# Patient Record
Sex: Female | Born: 1965 | Race: Black or African American | Hispanic: No | Smoking: Never smoker
Health system: Southern US, Community
[De-identification: ages and names within clinical notes are randomized; demographics above are authoritative.]

## PROBLEM LIST (undated history)

## (undated) DIAGNOSIS — G4730 Sleep apnea, unspecified: Secondary | ICD-10-CM

## (undated) DIAGNOSIS — Z8679 Personal history of other diseases of the circulatory system: Secondary | ICD-10-CM

## (undated) DIAGNOSIS — R002 Palpitations: Secondary | ICD-10-CM

## (undated) DIAGNOSIS — R3915 Urgency of urination: Secondary | ICD-10-CM

## (undated) DIAGNOSIS — Z973 Presence of spectacles and contact lenses: Secondary | ICD-10-CM

## (undated) DIAGNOSIS — R112 Nausea with vomiting, unspecified: Secondary | ICD-10-CM

## (undated) DIAGNOSIS — B009 Herpesviral infection, unspecified: Secondary | ICD-10-CM

## (undated) DIAGNOSIS — M8580 Other specified disorders of bone density and structure, unspecified site: Secondary | ICD-10-CM

## (undated) DIAGNOSIS — M419 Scoliosis, unspecified: Secondary | ICD-10-CM

## (undated) DIAGNOSIS — E7800 Pure hypercholesterolemia, unspecified: Secondary | ICD-10-CM

## (undated) DIAGNOSIS — D219 Benign neoplasm of connective and other soft tissue, unspecified: Secondary | ICD-10-CM

## (undated) DIAGNOSIS — I10 Essential (primary) hypertension: Secondary | ICD-10-CM

## (undated) DIAGNOSIS — C801 Malignant (primary) neoplasm, unspecified: Secondary | ICD-10-CM

## (undated) DIAGNOSIS — L709 Acne, unspecified: Secondary | ICD-10-CM

## (undated) DIAGNOSIS — T4145XA Adverse effect of unspecified anesthetic, initial encounter: Secondary | ICD-10-CM

## (undated) DIAGNOSIS — Z9889 Other specified postprocedural states: Secondary | ICD-10-CM

## (undated) DIAGNOSIS — A630 Anogenital (venereal) warts: Secondary | ICD-10-CM

## (undated) DIAGNOSIS — T8859XA Other complications of anesthesia, initial encounter: Secondary | ICD-10-CM

## (undated) DIAGNOSIS — D0510 Intraductal carcinoma in situ of unspecified breast: Secondary | ICD-10-CM

## (undated) DIAGNOSIS — N83202 Unspecified ovarian cyst, left side: Secondary | ICD-10-CM

## (undated) DIAGNOSIS — Z923 Personal history of irradiation: Secondary | ICD-10-CM

## (undated) HISTORY — DX: Personal history of irradiation: Z92.3

## (undated) HISTORY — DX: Malignant (primary) neoplasm, unspecified: C80.1

## (undated) HISTORY — DX: Scoliosis, unspecified: M41.9

## (undated) HISTORY — DX: Acne, unspecified: L70.9

## (undated) HISTORY — DX: Essential (primary) hypertension: I10

## (undated) HISTORY — DX: Pure hypercholesterolemia, unspecified: E78.00

## (undated) HISTORY — DX: Other specified disorders of bone density and structure, unspecified site: M85.80

## (undated) HISTORY — PX: WISDOM TOOTH EXTRACTION: SHX21

## (undated) HISTORY — DX: Anogenital (venereal) warts: A63.0

## (undated) HISTORY — DX: Intraductal carcinoma in situ of unspecified breast: D05.10

## (undated) HISTORY — DX: Sleep apnea, unspecified: G47.30

## (undated) HISTORY — DX: Herpesviral infection, unspecified: B00.9

---

## 1992-04-12 HISTORY — PX: LASER ABLATION OF THE CERVIX: SHX1949

## 1997-09-14 ENCOUNTER — Inpatient Hospital Stay (HOSPITAL_COMMUNITY): Admission: AD | Admit: 1997-09-14 | Discharge: 1997-09-14 | Payer: Self-pay | Admitting: Obstetrics and Gynecology

## 1997-11-06 ENCOUNTER — Other Ambulatory Visit: Admission: RE | Admit: 1997-11-06 | Discharge: 1997-11-06 | Payer: Self-pay | Admitting: Obstetrics and Gynecology

## 1997-11-11 ENCOUNTER — Other Ambulatory Visit: Admission: RE | Admit: 1997-11-11 | Discharge: 1997-11-11 | Payer: Self-pay | Admitting: Obstetrics and Gynecology

## 1998-01-15 ENCOUNTER — Encounter: Admission: RE | Admit: 1998-01-15 | Discharge: 1998-04-15 | Payer: Self-pay | Admitting: Gynecology

## 1998-07-10 ENCOUNTER — Encounter: Payer: Self-pay | Admitting: Gynecology

## 1998-07-10 ENCOUNTER — Inpatient Hospital Stay (HOSPITAL_COMMUNITY): Admission: AD | Admit: 1998-07-10 | Discharge: 1998-07-14 | Payer: Self-pay | Admitting: Obstetrics and Gynecology

## 1998-08-21 ENCOUNTER — Other Ambulatory Visit: Admission: RE | Admit: 1998-08-21 | Discharge: 1998-08-21 | Payer: Self-pay | Admitting: Obstetrics and Gynecology

## 1998-08-23 ENCOUNTER — Emergency Department (HOSPITAL_COMMUNITY): Admission: EM | Admit: 1998-08-23 | Discharge: 1998-08-23 | Payer: Self-pay | Admitting: Emergency Medicine

## 1999-01-02 ENCOUNTER — Emergency Department (HOSPITAL_COMMUNITY): Admission: EM | Admit: 1999-01-02 | Discharge: 1999-01-03 | Payer: Self-pay | Admitting: Emergency Medicine

## 1999-01-02 ENCOUNTER — Emergency Department (HOSPITAL_COMMUNITY): Admission: EM | Admit: 1999-01-02 | Discharge: 1999-01-02 | Payer: Self-pay | Admitting: Emergency Medicine

## 1999-01-03 ENCOUNTER — Encounter: Payer: Self-pay | Admitting: Emergency Medicine

## 1999-08-24 ENCOUNTER — Other Ambulatory Visit: Admission: RE | Admit: 1999-08-24 | Discharge: 1999-08-24 | Payer: Self-pay | Admitting: Gynecology

## 2000-08-24 ENCOUNTER — Other Ambulatory Visit: Admission: RE | Admit: 2000-08-24 | Discharge: 2000-08-24 | Payer: Self-pay | Admitting: Gynecology

## 2001-07-20 ENCOUNTER — Encounter: Payer: Self-pay | Admitting: Gynecology

## 2001-07-20 ENCOUNTER — Ambulatory Visit (HOSPITAL_COMMUNITY): Admission: RE | Admit: 2001-07-20 | Discharge: 2001-07-20 | Payer: Self-pay | Admitting: Gynecology

## 2001-08-28 ENCOUNTER — Other Ambulatory Visit: Admission: RE | Admit: 2001-08-28 | Discharge: 2001-08-28 | Payer: Self-pay | Admitting: Gynecology

## 2001-09-08 ENCOUNTER — Ambulatory Visit (HOSPITAL_COMMUNITY): Admission: RE | Admit: 2001-09-08 | Discharge: 2001-09-08 | Payer: Self-pay | Admitting: Gynecology

## 2001-09-08 HISTORY — PX: TUBAL LIGATION: SHX77

## 2001-11-21 ENCOUNTER — Encounter: Admission: RE | Admit: 2001-11-21 | Discharge: 2001-11-21 | Payer: Self-pay | Admitting: Occupational Medicine

## 2001-11-21 ENCOUNTER — Encounter: Payer: Self-pay | Admitting: Occupational Medicine

## 2002-09-04 ENCOUNTER — Other Ambulatory Visit: Admission: RE | Admit: 2002-09-04 | Discharge: 2002-09-04 | Payer: Self-pay | Admitting: Obstetrics and Gynecology

## 2003-07-22 ENCOUNTER — Ambulatory Visit (HOSPITAL_COMMUNITY): Admission: RE | Admit: 2003-07-22 | Discharge: 2003-07-22 | Payer: Self-pay | Admitting: Gynecology

## 2003-09-10 ENCOUNTER — Other Ambulatory Visit: Admission: RE | Admit: 2003-09-10 | Discharge: 2003-09-10 | Payer: Self-pay | Admitting: Gynecology

## 2004-10-07 ENCOUNTER — Other Ambulatory Visit: Admission: RE | Admit: 2004-10-07 | Discharge: 2004-10-07 | Payer: Self-pay | Admitting: Gynecology

## 2004-12-23 ENCOUNTER — Encounter: Admission: RE | Admit: 2004-12-23 | Discharge: 2004-12-23 | Payer: Self-pay | Admitting: Gynecology

## 2005-04-12 HISTORY — PX: ENDOMETRIAL ABLATION W/ NOVASURE: SUR434

## 2005-10-18 ENCOUNTER — Other Ambulatory Visit: Admission: RE | Admit: 2005-10-18 | Discharge: 2005-10-18 | Payer: Self-pay | Admitting: Gynecology

## 2006-10-31 ENCOUNTER — Other Ambulatory Visit: Admission: RE | Admit: 2006-10-31 | Discharge: 2006-10-31 | Payer: Self-pay | Admitting: Gynecology

## 2007-11-01 ENCOUNTER — Other Ambulatory Visit: Admission: RE | Admit: 2007-11-01 | Discharge: 2007-11-01 | Payer: Self-pay | Admitting: Gynecology

## 2008-03-15 ENCOUNTER — Ambulatory Visit: Payer: Self-pay | Admitting: Gynecology

## 2008-05-20 ENCOUNTER — Ambulatory Visit: Payer: Self-pay | Admitting: Gynecology

## 2008-11-01 ENCOUNTER — Other Ambulatory Visit: Admission: RE | Admit: 2008-11-01 | Discharge: 2008-11-01 | Payer: Self-pay | Admitting: Gynecology

## 2008-11-01 ENCOUNTER — Encounter: Payer: Self-pay | Admitting: Gynecology

## 2008-11-01 ENCOUNTER — Ambulatory Visit: Payer: Self-pay | Admitting: Gynecology

## 2008-11-08 ENCOUNTER — Ambulatory Visit: Payer: Self-pay | Admitting: Gynecology

## 2009-01-01 ENCOUNTER — Ambulatory Visit: Payer: Self-pay | Admitting: Gynecology

## 2009-11-03 ENCOUNTER — Ambulatory Visit: Payer: Self-pay | Admitting: Gynecology

## 2009-11-03 ENCOUNTER — Other Ambulatory Visit: Admission: RE | Admit: 2009-11-03 | Discharge: 2009-11-03 | Payer: Self-pay | Admitting: Gynecology

## 2010-04-12 DIAGNOSIS — C801 Malignant (primary) neoplasm, unspecified: Secondary | ICD-10-CM

## 2010-04-12 HISTORY — DX: Malignant (primary) neoplasm, unspecified: C80.1

## 2010-08-28 NOTE — Op Note (Signed)
[facility]  Patient:    Leslie Salazar, Leslie Salazar Visit Number: [id number] MRN: [id number]          Service Type: DSU Location: [facility] Attending Physician:  Tonye Royalty Dictated by:   Gaetano Hawthorne. Lily Peer, M.D. Proc. Date: 09/08/01 Admit Date:  09/08/2001 Discharge Date: 09/08/2001                             Operative Report  PREOPERATIVE DIAGNOSIS:       Request for elective permanent sterilization.  POSTOPERATIVE DIAGNOSIS:      Request for elective permanent sterilization.  PROCEDURE PERFORMED:          Laparoscopic tubal sterilization procedure, Hulka clip technique, bilateral.  SURGEON:                      Juan H. Lily Peer, M.D.  ANESTHESIA:                   General endotracheal.  INDICATIONS FOR OPERATION:    A 45 year old gravida 1, para 1 with request for elective permanent sterilization.  The patient was previously counseled as to the risks, benefits, alternatives and failure rates of laparoscopic tubal sterilization procedure and ACOG guidelines were previously provided.  FINDINGS:                     Normal tubes and ovaries.  Small, multiple subserosal myomas.  Slightly retroverted, upper limits of normal uterus, approximately 4-6 weeks in size.  Possible intramural myomas, as well.  DESCRIPTION OF OPERATION:     After the patient was adequately counseled, the patient was taken to the operating room, where she underwent successful general endotracheal anesthesia.  She was placed in the low lithotomy position.  The abdomen, vagina and perineum were prepped and draped in the usual sterile fashion.  A red rubber Roxan Hockey was inserted in an effort to evacuate the bladder contents.  Once this was completed, a Hulka tenaculum was placed after examination under anesthesia was completed.  Once the drapes were in place, a small stab incision was made in the area of the umbilicus, followed by insertion of a Veress needle.  Opening  intra-abdominal pressure was approximately 5 mmHg.  A total of 3 L of carbon dioxide was insufflated into the peritoneal cavity.  The Veress needle was removed.  A 10-mm trocar was inserted.  Under laparoscopic guidance, a 5-mm trocar was inserted approximately 2 cm above the symphysis pubis.  A self-retaining grasper was placed and the distal portion of the right fallopian tube marked for identification.  A Hulka clip was applied to the proximal 1/3 portion of the fallopian tube, completely occluding the fallopian tube.  A similar procedure was carried out on the contralateral side.  Pictures before and after the procedure were obtained.  Carbon dioxide was removed from the abdominopelvic cavity.  The 5-mm and 10-mm trocars and sleeves were removed.  In the 10-mm trocar site, the fascia was closed with a running suture of 3-0 Vicryl suture and the skin was reapproximated on both incision sites with Dermabond glue. Marcaine 0.25% was infiltrated into both incision sites, approximately 8 cc. The Hulka tenaculum was removed.  The patient was extubated and transferred to the recovery room with stable vital signs.  Blood loss was minimal.  Fluid resuscitation consisted of 1200 cc of lactated Ringers. Dictated by:   Gaetano Hawthorne Lily Peer, M.D. Attending  Physician:  Tonye Royalty DD:  09/08/01 TD:  09/09/01 Job: [id number]

## 2010-08-28 NOTE — H&P (Signed)
[facility]  Patient:    Leslie Salazar, Leslie Salazar Visit Number: [id number] MRN: [id number]          Service Type: DSU Location: [facility] Attending Physician:  Tonye Royalty Dictated by:   Gaetano Hawthorne. Lily Peer, M.D. Admit Date:  09/08/2001 Discharge Date: 09/08/2001                           History and Physical  PREOPERATIVE HISTORY AND PHYSICAL  CHIEF COMPLAINT:              Request for elective permanent sterilization.  HISTORY:                      The patient is a 45 year old gravida 1, para 1, who was seen in the office at [facility] on May 19, and had voiced a request for an elective permanent sterilization.  The patient states that she has one child and two stepchildren and she is now 45 and approaching 45 years of age and she wanted to proceed with an elective permanent sterilization.  At that office visit on her annual exam she was counseled as to the risks, benefits, pros and cons and failure rate of tubal sterilization procedure and literature information from the [employer] of OB/GYN had been provided.  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  She has a history of hypertension for which she is taking Accuretic.  OPERATIONS:                   Previous cesarean section.  She has also had laser treatment of her cervix for dysplasia.  FAMILY HISTORY:               Hypertension in various family members.  ALLERGIES AND SOCIAL HISTORY:                      She denies any allergies, alcohol or smoking history.  PHYSICAL EXAMINATION:  GENERAL:                      A well-developed, well-nourished female in no acute distress.  HEENT:                        Unremarkable.  NECK:                         Supple, trachea midline.  No carotid bruits, no thyromegaly.  LUNGS:                        Clear to auscultation without rhonchi, rales or wheezes.  HEART:                         Regular rate and rhythm, no murmurs or gallops.  BREAST EXAM:                  At the time of her annual exam was reported to be normal.  ABDOMEN:                      Soft, nontender without rebound or guarding.  PELVIC:  Bartholin, urethra, Skenes glands within normal limits.  Vagina and cervix with no lesions or discharge.  Uterus anteverted; normal size, shape and consistency.  Adnexa without mass or tenderness.  RECTAL EXAM:                  Not done.  ASSESSMENT:                   A 45 year old gravida 1, para 1, interested in elective permanent sterilization.  The patient has two stepchildren also that live with her.  Her and her husband have made a conscientious decision to proceed with elective permanent sterilization.  The patient is fully aware she will not be able to have any more children.  The risks, benefits, pros and cons were discussed to include failure rate, infection, trauma to internal organ, the need for open laparotomy in the case of any trauma encountered during the laparoscopic procedure or inaccessibility to the pelvic cavity to proceed with the operation laparoscopically and that an open abdominal technique may need to be used to gain access to the fallopian tubes at which time she will be required to stay in the hospital an additional day or so. All these issues were discussed with the patient and literature had previously been provided from the [employer] of OB/GYN outlining all these issues and all questions were answered.  Will follow accordingly.  PLAN:                         The patient is scheduled for a laparoscopic tubal sterilization procedure today May 30, at 1 p.m. at [facility]. Dictated by:   Gaetano Hawthorne. Lily Peer, M.D. Attending Physician:  Tonye Royalty DD:  09/08/01 TD:  09/08/01 Job: [id number]

## 2010-10-19 ENCOUNTER — Encounter: Payer: Self-pay | Admitting: Anesthesiology

## 2010-11-09 ENCOUNTER — Other Ambulatory Visit (HOSPITAL_COMMUNITY)
Admission: RE | Admit: 2010-11-09 | Discharge: 2010-11-09 | Disposition: A | Discharge status: Home / Self Care | Source: Ambulatory Visit | Attending: Gynecology | Admitting: Gynecology

## 2010-11-09 ENCOUNTER — Ambulatory Visit (INDEPENDENT_AMBULATORY_CARE_PROVIDER_SITE_OTHER): Admitting: Gynecology

## 2010-11-09 ENCOUNTER — Encounter: Payer: Self-pay | Admitting: Gynecology

## 2010-11-09 VITALS — BP 108/72 | Ht 62.25 in | Wt 125.0 lb

## 2010-11-09 DIAGNOSIS — N898 Other specified noninflammatory disorders of vagina: Secondary | ICD-10-CM

## 2010-11-09 DIAGNOSIS — N942 Vaginismus: Secondary | ICD-10-CM | POA: Insufficient documentation

## 2010-11-09 DIAGNOSIS — Z01419 Encounter for gynecological examination (general) (routine) without abnormal findings: Secondary | ICD-10-CM | POA: Insufficient documentation

## 2010-11-09 DIAGNOSIS — B373 Candidiasis of vulva and vagina: Secondary | ICD-10-CM

## 2010-11-09 DIAGNOSIS — Z113 Encounter for screening for infections with a predominantly sexual mode of transmission: Secondary | ICD-10-CM

## 2010-11-09 DIAGNOSIS — B3731 Acute candidiasis of vulva and vagina: Secondary | ICD-10-CM

## 2010-11-09 DIAGNOSIS — N952 Postmenopausal atrophic vaginitis: Secondary | ICD-10-CM

## 2010-11-09 MED ORDER — FLUCONAZOLE 150 MG PO TABS: 150.0000 mg | ORAL_TABLET | Freq: Once | ORAL | Status: AC

## 2010-11-09 MED ORDER — VALACYCLOVIR HCL 500 MG PO TABS: 500.0000 mg | ORAL_TABLET | Freq: Every day | ORAL | Status: AC

## 2010-11-09 NOTE — Progress Notes (Signed)
Leslie Salazar [facility] 1965-08-29 [id number]   History:    45 y.o.  for annual exam and was complaining of slight vaginal discharge. She is complaining of vaginal irritation and atrophy at times.  Past medical history,surgical history, family history and social history were all reviewed and documented in the EPIC chart. ROS:  Was performed and pertinent positives and negatives are included in the history.  Exam: chaperone present Filed Vitals:   11/09/10 0945  BP: 108/72   General appearance  Normal Skin grossly normal Head/Neck normal with no cervical or supraclavicular adenopathy thyroid normal Lungs  clear Cardiac RR, without RMG Abdominal  soft, nontender, without masses, guarding, rebound or organomegaly  Breasts  examined lying and sitting without masses, retractions, discharge or axillary adenopathy. Pelvic  Ext/BUS/vagina  normal   Cervix  normal   Uterus  anteverted, normal size, shape and contour, midline and mobile nontender   Adnexa  Without masses or tenderness  Anus and perineum  normal   Rectovaginal  normal sphincter tone without palpated masses or tenderness.   Assessment/Plan:  45 y.o. female for annual exam patient had been complaining of vaginal discharge wet prep GC and chlamydia culture was obtained as well. Her wet prep demonstrated moniliasis and she will be started on Diflucan 150 mg 1 by mouth daily. Will notify her as any abnormality in the [facility] and chlamydia culture. Her primary physician had recently done her lab work so no additional blood will be drawn today. She is overdue for mammogram and will be scheduled. Due to the limited examination because of patient's vaginismus will schedule ultrasound for next week. Patient will continue on her Divigel 1005 mg per day with the addition of Prometrium 200 mg daily for 10 days of each month. She will also need to have a bone density study next year. We'll notify her if there is eating abnormality of the GC and chlamydia  culture. We will see her next week to discuss results of the ultrasound. She was instructed to continue with calcium with vitamin D for osteoporosis prevention as well as weightbearing exercises 30-45 minutes 3-4 times a week.    Ok Edwards MD, 9:33 PM 11/09/2010

## 2010-11-09 NOTE — Patient Instructions (Signed)
Vagifem tablet apply intravaginal two times a week to help with  Vaginal dryness

## 2010-11-20 ENCOUNTER — Other Ambulatory Visit

## 2010-11-20 ENCOUNTER — Ambulatory Visit: Admitting: Gynecology

## 2010-12-07 ENCOUNTER — Ambulatory Visit (INDEPENDENT_AMBULATORY_CARE_PROVIDER_SITE_OTHER): Admitting: Gynecology

## 2010-12-07 ENCOUNTER — Encounter: Payer: Self-pay | Admitting: Gynecology

## 2010-12-07 ENCOUNTER — Other Ambulatory Visit

## 2010-12-07 ENCOUNTER — Inpatient Hospital Stay: Admission: RE | Admit: 2010-12-07 | Payer: Self-pay | Source: Ambulatory Visit

## 2010-12-07 DIAGNOSIS — D259 Leiomyoma of uterus, unspecified: Secondary | ICD-10-CM

## 2010-12-07 DIAGNOSIS — N959 Unspecified menopausal and perimenopausal disorder: Secondary | ICD-10-CM

## 2010-12-07 DIAGNOSIS — N942 Vaginismus: Secondary | ICD-10-CM

## 2010-12-07 DIAGNOSIS — N8300 Follicular cyst of ovary, unspecified side: Secondary | ICD-10-CM

## 2010-12-07 DIAGNOSIS — D252 Subserosal leiomyoma of uterus: Secondary | ICD-10-CM

## 2010-12-07 NOTE — Patient Instructions (Signed)
Remember to schedule your mammogram 

## 2010-12-07 NOTE — Progress Notes (Signed)
Patient is a 45 year old gravida 1 para 1 who was seen in the office recently for her annual gynecological exam. Patient presented to the office today to discuss her ultrasound due to the fact that time of her exam she suffers from vaginismus make it difficult to evaluate her pelvis. Review of her records indicated that she stopped her hormone replacement therapy over a month ago and states she is not having any hot flashes your ability or mood swings. She does suffer time some vaginal dryness and had previously given her prescription of Vagifem 10 mcg to apply intravaginally twice a week. She is still overdue for mammogram which is in the process of scheduling. She's doing her monthly self breast examinations. Her recent Pap smear was normal. The ultrasound today demonstrated a normal-size uterus measuring 10 x 5.7 3.9 cm with an endometrial stripe of 2.4 mm a small posterior myoma measuring 13 x 10 mm with some calcifications noted the right ovary was normal left ovary had a few follicles. Uterus is consistent with adenomyosis. Patient will otherwise return back to the office in one year or when necessary.

## 2010-12-25 ENCOUNTER — Encounter: Payer: Self-pay | Admitting: Gynecology

## 2010-12-28 ENCOUNTER — Other Ambulatory Visit: Payer: Self-pay | Admitting: *Deleted

## 2010-12-28 DIAGNOSIS — R921 Mammographic calcification found on diagnostic imaging of breast: Secondary | ICD-10-CM

## 2010-12-31 ENCOUNTER — Encounter: Payer: Self-pay | Admitting: Gynecology

## 2010-12-31 ENCOUNTER — Encounter: Payer: Self-pay | Admitting: *Deleted

## 2011-01-04 ENCOUNTER — Telehealth: Payer: Self-pay | Admitting: *Deleted

## 2011-01-04 NOTE — Telephone Encounter (Signed)
Pt was aware with the below note. Pt says that her PCP will write the order for the right breast stereotactic biopsy.

## 2011-01-04 NOTE — Telephone Encounter (Signed)
Message copied by Aura Camps on Mon Jan 04, 2011  9:17 AM ------      Message from: Ok Edwards      Created: Thu Dec 31, 2010  4:08 PM       Victorino Dike, I was reviewing patient's recent mammogram and they had recommended right breast stereotactic biopsy. The radiologist did not mention on his report the patient been contacted to schedule such procedure please let been know.

## 2011-01-08 ENCOUNTER — Encounter: Payer: Self-pay | Admitting: Gynecology

## 2011-01-15 ENCOUNTER — Encounter: Payer: Self-pay | Admitting: Gynecology

## 2011-01-19 ENCOUNTER — Encounter: Payer: Self-pay | Admitting: Gynecology

## 2011-01-27 ENCOUNTER — Encounter: Payer: Self-pay | Admitting: Gynecology

## 2011-02-01 ENCOUNTER — Encounter: Payer: Self-pay | Admitting: Gynecology

## 2011-02-01 ENCOUNTER — Ambulatory Visit (INDEPENDENT_AMBULATORY_CARE_PROVIDER_SITE_OTHER): Admitting: Gynecology

## 2011-02-01 VITALS — BP 124/82

## 2011-02-01 DIAGNOSIS — B3731 Acute candidiasis of vulva and vagina: Secondary | ICD-10-CM

## 2011-02-01 DIAGNOSIS — B373 Candidiasis of vulva and vagina: Secondary | ICD-10-CM

## 2011-02-01 DIAGNOSIS — D0510 Intraductal carcinoma in situ of unspecified breast: Secondary | ICD-10-CM

## 2011-02-01 DIAGNOSIS — B009 Herpesviral infection, unspecified: Secondary | ICD-10-CM

## 2011-02-01 DIAGNOSIS — D0590 Unspecified type of carcinoma in situ of unspecified breast: Secondary | ICD-10-CM

## 2011-02-01 DIAGNOSIS — N898 Other specified noninflammatory disorders of vagina: Secondary | ICD-10-CM

## 2011-02-01 DIAGNOSIS — A609 Anogenital herpesviral infection, unspecified: Secondary | ICD-10-CM

## 2011-02-01 MED ORDER — ACYCLOVIR 5 % EX OINT: TOPICAL_OINTMENT | CUTANEOUS | Status: DC

## 2011-02-01 MED ORDER — FLUCONAZOLE 150 MG PO TABS: 150.0000 mg | ORAL_TABLET | Freq: Once | ORAL | Status: AC

## 2011-02-01 MED ORDER — FLUCONAZOLE 150 MG PO TABS: 150.0000 mg | ORAL_TABLET | Freq: Once | ORAL | Status: DC

## 2011-02-01 MED ORDER — ACYCLOVIR 5 % EX OINT: TOPICAL_OINTMENT | CUTANEOUS | Status: AC

## 2011-02-01 NOTE — Progress Notes (Signed)
Patient presented to the office today complaining of vaginal discharge and possible outbreak from her HSV. Patient recently was diagnosed with ductal carcinoma in situ of the right breast and is in the process of having a lumpectomy followed by radiation. Lumpectomy scheduled for November 6 general surgeons Dr. Lorenso Quarry in [facility]. Patient had stopped her medication (a cycle there) which she takes for suppression due to the fact that she had a hematoma developed at time of her breast biopsy.  Exam: Breast exam: Right breast area slightly indurated were biopsy in the outer quadrant of the right breast was noted small resolving hematoma. Left breast unremarkable. Bilateral there was no supraclavicular axillary lymphadenopathy. A small perirectal HSV outbreak was evident Vaginal exam: Is white discharge was noted  Wet prep demonstrated moniliasis  Plan: Diflucan 150 mg one by mouth. Acyclovir cream to apply twice a day 5-7 days when necessary. She may restart her Valtrex for suppression of HSV daily as she was previously taking. I've asked her to talk to the general surgeon to send this copy of the final lumpectomy and treatment plans we can obtain her record. A copy of the pathology report was given to the patient has per her request.

## 2011-03-25 ENCOUNTER — Telehealth: Payer: Self-pay | Admitting: *Deleted

## 2011-03-25 NOTE — Telephone Encounter (Signed)
Pt called wanted to know if JF received her labs and pathology reports from her other MD. Left message on VM that records were faxed here.

## 2011-04-13 DIAGNOSIS — Z923 Personal history of irradiation: Secondary | ICD-10-CM

## 2011-04-13 HISTORY — DX: Personal history of irradiation: Z92.3

## 2011-04-20 HISTORY — PX: INSERTION OF TISSUE EXPANDER AFTER MASTECTOMY: SHX1831

## 2011-05-19 ENCOUNTER — Encounter: Payer: Self-pay | Admitting: Gynecology

## 2011-06-10 ENCOUNTER — Other Ambulatory Visit: Payer: Self-pay | Admitting: Gynecology

## 2011-06-10 ENCOUNTER — Telehealth: Payer: Self-pay | Admitting: *Deleted

## 2011-06-10 MED ORDER — VALACYCLOVIR HCL 500 MG PO TABS: 500.0000 mg | ORAL_TABLET | Freq: Every day | ORAL | Status: DC

## 2011-06-10 NOTE — Telephone Encounter (Signed)
Pt called requesting refill on her valtrex 500 mg  For suppressive therapy. Please advise

## 2011-06-11 NOTE — Telephone Encounter (Signed)
rx sent to pharmacy by JF, pt informed with this as well.

## 2011-09-30 ENCOUNTER — Telehealth: Payer: Self-pay | Admitting: *Deleted

## 2011-09-30 DIAGNOSIS — N942 Vaginismus: Secondary | ICD-10-CM

## 2011-09-30 NOTE — Telephone Encounter (Signed)
Patient to have ultrasound same day as annual. Will need pap first then ultrasound.

## 2011-09-30 NOTE — Telephone Encounter (Signed)
(  pt is ware you are out of the office) Pt has annual schedule on 11/15/11 she would like to know if okay to do ultrasound same day like in the past due to vaginismus which makes it difficult to evaluate her pelvis. Please advise.

## 2011-10-01 NOTE — Telephone Encounter (Signed)
Ultrasound scheduled

## 2011-11-15 ENCOUNTER — Ambulatory Visit (INDEPENDENT_AMBULATORY_CARE_PROVIDER_SITE_OTHER): Admitting: Gynecology

## 2011-11-15 ENCOUNTER — Encounter: Admitting: Gynecology

## 2011-11-15 ENCOUNTER — Ambulatory Visit (INDEPENDENT_AMBULATORY_CARE_PROVIDER_SITE_OTHER)

## 2011-11-15 ENCOUNTER — Encounter: Payer: Self-pay | Admitting: Gynecology

## 2011-11-15 VITALS — BP 120/78 | Ht 62.0 in | Wt 138.0 lb

## 2011-11-15 DIAGNOSIS — Z01419 Encounter for gynecological examination (general) (routine) without abnormal findings: Secondary | ICD-10-CM

## 2011-11-15 DIAGNOSIS — D252 Subserosal leiomyoma of uterus: Secondary | ICD-10-CM

## 2011-11-15 DIAGNOSIS — N942 Vaginismus: Secondary | ICD-10-CM

## 2011-11-15 DIAGNOSIS — N898 Other specified noninflammatory disorders of vagina: Secondary | ICD-10-CM

## 2011-11-15 DIAGNOSIS — N8300 Follicular cyst of ovary, unspecified side: Secondary | ICD-10-CM

## 2011-11-15 DIAGNOSIS — D251 Intramural leiomyoma of uterus: Secondary | ICD-10-CM

## 2011-11-15 DIAGNOSIS — D259 Leiomyoma of uterus, unspecified: Secondary | ICD-10-CM

## 2011-11-15 LAB — WET PREP FOR TRICH, YEAST, CLUE
Clue Cells Wet Prep HPF POC: NONE SEEN
Trich, Wet Prep: NONE SEEN

## 2011-11-15 MED ORDER — METRONIDAZOLE 0.75 % VA GEL: VAGINAL | Status: DC

## 2011-11-15 NOTE — Progress Notes (Addendum)
Leslie Salazar 1965-11-13 [id number]   History:    46 y.o.  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 She was tested for the BRCA1 and BRCA2 N. no mutations were detected. She just recently completed radiation treatment in may and is currently on tamoxifen. She was having slight watery discharge today. She's been followed by the oncologist Dr. Mathis Bud in [place]. In 1994 patient had CO2 laser of cervical dysplasia at another practice. Ever since then her Pap smears have been normal. She had premature ovarian failure 2 years ago.  Past medical history,surgical history, family history and social history were all reviewed and documented in the EPIC chart.  Gynecologic History No LMP recorded. Patient has had an ablation. Contraception: Premature ovarian failure, laparoscopic tubal ligation Last Pap: 2012 and. Results [id number]} Last mammogram: 2012. Results were: abnormal  Obstetric History OB History    Grav Para Term Preterm Abortions TAB SAB Ect Mult Living   1 1 1       1      # Outc Date GA Lbr Len/2nd Wgt Sex Del Anes PTL Lv   1 TRM     M CS  No Yes       ROS: A ROS was performed and pertinent positives and negatives are included in the history.  GENERAL: No fevers or chills. HEENT: No change in vision, no earache, sore throat or sinus congestion. NECK: No pain or stiffness. CARDIOVASCULAR: No chest pain or pressure. No palpitations. PULMONARY: No shortness of breath, cough or wheeze. GASTROINTESTINAL: No abdominal pain, nausea, vomiting or diarrhea, melena or bright red blood per rectum. GENITOURINARY: No urinary frequency, urgency, hesitancy or dysuria. MUSCULOSKELETAL: No joint or muscle pain, no back pain, no recent trauma. DERMATOLOGIC: No rash, no itching, no lesions. ENDOCRINE: No  polyuria, polydipsia, no heat or cold intolerance. No recent change in weight. HEMATOLOGICAL: No anemia or easy bruising or bleeding. NEUROLOGIC: No headache, seizures, numbness, tingling or weakness. PSYCHIATRIC: No depression, no loss of interest in normal activity or change in sleep pattern.     Exam: chaperone present  BP 120/78  Ht 5\' 2"  (1.575 m)  Wt 138 lb (62.596 kg)  BMI 25.24 kg/m2  Body mass index is 25.24 kg/(m^2).  General appearance : Well developed well nourished female. No acute distress HEENT: Neck supple, trachea midline, no carotid bruits, no thyroidmegaly Lungs: Clear to auscultation, no rhonchi or wheezes, or rib retractions  Heart: Regular rate and rhythm, no murmurs or gallops Breast:Examined in sitting and supine position were symmetrical in appearance, no palpable masses or tenderness,  no skin retraction, no nipple inversion, no nipple discharge, no skin discoloration, no axillary or supraclavicular lymphadenopathy Abdomen: no palpable masses or tenderness, no rebound or guarding Extremities: no edema or skin discoloration or tenderness  Pelvic:  Bartholin, Urethra, Skene Glands: Within normal limits             Vagina: No gross lesions or discharge  Cervix: No gross lesions or discharge  Uterus  anteverted, normal size, shape and consistency, non-tender and mobile  Adnexa  Without masses or tenderness  Anus and perineum  normal   Rectovaginal  normal sphincter tone without palpated masses or tenderness             Hemoccult not done     Assessment/Plan:  46 y.o. female  for annual exam who recently completed radiation therapy for her breast cancer. We discussed the new Pap smear screening guidelines and she will 89 this year. Her primary physician and her oncologist have been doing her blood work so no blood work was drawn today. We will do a bone density study next year. We discussed importance of calcium and vitamin D as well as weightbearing exercises for  osteoporosis prevention. Her wet prep was negative today. She was called in a prescription for MetroGel vaginal cream for 5 days if symptoms continue. Patient did have an ultrasound today for baseline since she recently started tamoxifen as a result of her breast cancer history. The ultrasound demonstrated a normal-size uterus measured 8.3 x 5.1 x 3.6 cm with endometrial stripe of 2.6 cm both ovaries are otherwise normal. She had a very small posterior subserosal myoma measuring 11 x 9 mm.    Ok Edwards MD, 1:49 PM 11/15/2011

## 2011-11-15 NOTE — Patient Instructions (Addendum)
You can take caltrate plus or oscal or citracal or viactiv twice a day and one tablet on Vit D3 cholecalciferol 1,000 units daily.  Health Maintenance, Females A healthy lifestyle and preventative care can promote health and wellness.  Maintain regular health, dental, and eye exams.   Eat a healthy diet. Foods like vegetables, fruits, whole grains, low-fat dairy products, and lean protein foods contain the nutrients you need without too many calories. Decrease your intake of foods high in solid fats, added sugars, and salt. Get information about a proper diet from your caregiver, if necessary.   Regular physical exercise is one of the most important things you can do for your health. Most adults should get at least 150 minutes of moderate-intensity exercise (any activity that increases your heart rate and causes you to sweat) each week. In addition, most adults need muscle-strengthening exercises on 2 or more days a week.    Maintain a healthy weight. The body mass index (BMI) is a screening tool to identify possible weight problems. It provides an estimate of body fat based on height and weight. Your caregiver can help determine your BMI, and can help you achieve or maintain a healthy weight. For adults 20 years and older:   A BMI below 18.5 is considered underweight.   A BMI of 18.5 to 24.9 is normal.   A BMI of 25 to 29.9 is considered overweight.   A BMI of 30 and above is considered obese.   Maintain normal blood lipids and cholesterol by exercising and minimizing your intake of saturated fat. Eat a balanced diet with plenty of fruits and vegetables. Blood tests for lipids and cholesterol should begin at age 41 and be repeated every 5 years. If your lipid or cholesterol levels are high, you are over 50, or you are a high risk for heart disease, you may need your cholesterol levels checked more frequently.Ongoing high lipid and cholesterol levels should be treated with medicines if diet  and exercise are not effective.   If you smoke, find out from your caregiver how to quit. If you do not use tobacco, do not start.   If you are pregnant, do not drink alcohol. If you are breastfeeding, be very cautious about drinking alcohol. If you are not pregnant and choose to drink alcohol, do not exceed 1 drink per day. One drink is considered to be 12 ounces (355 mL) of beer, 5 ounces (148 mL) of wine, or 1.5 ounces (44 mL) of liquor.   Avoid use of street drugs. Do not share needles with anyone. Ask for help if you need support or instructions about stopping the use of drugs.   High blood pressure causes heart disease and increases the risk of stroke. Blood pressure should be checked at least every 1 to 2 years. Ongoing high blood pressure should be treated with medicines, if weight loss and exercise are not effective.   If you are 55 to 45 years old, ask your caregiver if you should take aspirin to prevent strokes.   Diabetes screening involves taking a blood sample to check your fasting blood sugar level. This should be done once every 3 years, after age 66, if you are within normal weight and without risk factors for diabetes. Testing should be considered at a younger age or be carried out more frequently if you are overweight and have at least 1 risk factor for diabetes.   Breast cancer screening is essential preventative care for women. You should  practice "breast self-awareness." This means understanding the normal appearance and feel of your breasts and may include breast self-examination. Any changes detected, no matter how small, should be reported to a caregiver. Women in their 20s and 30s should have a clinical breast exam (CBE) by a caregiver as part of a regular health exam every 1 to 3 years. After age 58, women should have a CBE every year. Starting at age 52, women should consider having a mammogram (breast X-ray) every year. Women who have a family history of breast cancer  should talk to their caregiver about genetic screening. Women at a high risk of breast cancer should talk to their caregiver about having an MRI and a mammogram every year.   The Pap test is a screening test for cervical cancer. Women should have a Pap test starting at age 8. Between ages 10 and 27, Pap tests should be repeated every 2 years. Beginning at age 13, you should have a Pap test every 3 years as long as the past 3 Pap tests have been normal. If you had a hysterectomy for a problem that was not cancer or a condition that could lead to cancer, then you no longer need Pap tests. If you are between ages 63 and 68, and you have had normal Pap tests going back 10 years, you no longer need Pap tests. If you have had past treatment for cervical cancer or a condition that could lead to cancer, you need Pap tests and screening for cancer for at least 20 years after your treatment. If Pap tests have been discontinued, risk factors (such as a new sexual partner) need to be reassessed to determine if screening should be resumed. Some women have medical problems that increase the chance of getting cervical cancer. In these cases, your caregiver may recommend more frequent screening and Pap tests.   The human papillomavirus (HPV) test is an additional test that may be used for cervical cancer screening. The HPV test looks for the virus that can cause the cell changes on the cervix. The cells collected during the Pap test can be tested for HPV. The HPV test could be used to screen women aged 40 years and older, and should be used in women of any age who have unclear Pap test results. After the age of 46, women should have HPV testing at the same frequency as a Pap test.   Colorectal cancer can be detected and often prevented. Most routine colorectal cancer screening begins at the age of 43 and continues through age 37. However, your caregiver may recommend screening at an earlier age if you have risk factors for  colon cancer. On a yearly basis, your caregiver may provide home test kits to check for hidden blood in the stool. Use of a small camera at the end of a tube, to directly examine the colon (sigmoidoscopy or colonoscopy), can detect the earliest forms of colorectal cancer. Talk to your caregiver about this at age 53, when routine screening begins. Direct examination of the colon should be repeated every 5 to 10 years through age 21, unless early forms of pre-cancerous polyps or small growths are found.   Hepatitis C blood testing is recommended for all people born from [birth year] through 1964 and any individual with known risks for hepatitis C.   Practice safe sex. Use condoms and avoid high-risk sexual practices to reduce the spread of sexually transmitted infections (STIs). Sexually active women aged 32 and younger should be checked  for Chlamydia, which is a common sexually transmitted infection. Older women with new or multiple partners should also be tested for Chlamydia. Testing for other STIs is recommended if you are sexually active and at increased risk.   Osteoporosis is a disease in which the bones lose minerals and strength with aging. This can result in serious bone fractures. The risk of osteoporosis can be identified using a bone density scan. Women ages 62 and over and women at risk for fractures or osteoporosis should discuss screening with their caregivers. Ask your caregiver whether you should be taking a calcium supplement or vitamin D to reduce the rate of osteoporosis.   Menopause can be associated with physical symptoms and risks. Hormone replacement therapy is available to decrease symptoms and risks. You should talk to your caregiver about whether hormone replacement therapy is right for you.   Use sunscreen with a sun protection factor (SPF) of 30 or greater. Apply sunscreen liberally and repeatedly throughout the day. You should seek shade when your shadow is shorter than you. Protect  yourself by wearing long sleeves, pants, a wide-brimmed hat, and sunglasses year round, whenever you are outdoors.   Notify your caregiver of new moles or changes in moles, especially if there is a change in shape or color. Also notify your caregiver if a mole is larger than the size of a pencil eraser.   Stay current with your immunizations.  Document Released: 10/12/2010 Document Revised: 03/18/2011 Document Reviewed: 10/12/2010 [facility] Patient Information 2012 [place], [place].

## 2011-12-12 HISTORY — PX: TISSUE EXPANDER REMOVAL: SHX2531

## 2012-01-05 ENCOUNTER — Other Ambulatory Visit: Payer: Self-pay | Admitting: Gynecology

## 2012-05-27 ENCOUNTER — Other Ambulatory Visit: Payer: Self-pay

## 2012-11-06 ENCOUNTER — Telehealth: Payer: Self-pay | Admitting: *Deleted

## 2012-11-06 DIAGNOSIS — Z853 Personal history of malignant neoplasm of breast: Secondary | ICD-10-CM

## 2012-11-06 NOTE — Telephone Encounter (Signed)
Please schedule GYN ultrasound at time of patient's annual exam visit because of her history of breast cancer, fibroid uterus, and vaginismus

## 2012-11-06 NOTE — Telephone Encounter (Signed)
Pt has annual scheduled on 12/05/12 she would like to know if okay to do ultrasound same day like in the past due to vaginismus which makes it difficult to evaluate her pelvis. Please advise.

## 2012-11-07 NOTE — Telephone Encounter (Signed)
Order placed, pt will be informed.

## 2012-11-10 ENCOUNTER — Other Ambulatory Visit: Payer: Self-pay | Admitting: Plastic Surgery

## 2012-11-10 DIAGNOSIS — C50911 Malignant neoplasm of unspecified site of right female breast: Secondary | ICD-10-CM

## 2012-11-10 NOTE — H&P (Signed)
This document contains confidential information from a [facility] medical record system and may be unauthenticated. Release may be made only with a valid authorization or in accordance with applicable policies of Medical Center or its affiliates. This document must be maintained in a secure manner or discarded/destroyed as required by Medical Center policy or by a confidential means such as shredding.    Leslie Salazar  08/15/2012 9:00 AM   Office Visit  MRN:  [id number]   Provider: Wayland Denis, DO  Department: [facility]  Dept Phone: [phone]    Diagnoses    Breast cancer, right    -  Primary   174.9      Reason for Visit   Breast Reconstruction     Vitals - Last Recorded    BP Pulse Ht Wt BMI      135/92  79  1.575 m (5\' 2" )  61.236 kg (135 lb)  24.69 kg/m2     Subjective:    Patient ID: Leslie Salazar is a 47 y.o. female.  HPI The patient is a 47 yrs old wf here for evaluation for breast reconstruction.  She was diagnosed with infiltrating ductal carcinoma of the right breast in 2012.  She underwent two lumpectomies followed by bilateral mastectomies.  It was T1b N0 M0, ER/PR positive, HER-2/neu positive.  She had right sided radiation which finished in 08/2011. She is negative for BRCA1 and BRCA2 mutations.  She then had expanders placed in 1/13 and then the implants (400 cc silicone smooth round high profile [id number]).  She is on tamoxifen.  She is 5 feet 2 inches tall, weighs 135 and wore a 36C cup bra. She would like to be around a C and B cup size.  She has darkening of the skin on the right due to the radiation and the implant is slightly higher, firmer and more lateral than the left. This is likely due to the radiation.  She is interested in a latissimus on the right to improve the asymmetry and was approved for this via [facility] with Dr. Meredith Mody.  The following portions of the patient's history were reviewed and updated as appropriate:  allergies, current medications, past family history, past medical history, past social history, past surgical history and problem list.  Review of Systems  Constitutional: Negative.   HENT: Negative.   Eyes: Negative.   Respiratory: Negative.   Cardiovascular: Negative.   Gastrointestinal: Negative.   Endocrine: Negative.   Genitourinary: Negative.   Hematological: Negative.   Psychiatric/Behavioral: Negative.    Objective:    Physical Exam  Constitutional: She appears well-developed and well-nourished.  HENT:   Head: Normocephalic and atraumatic.  Eyes: Conjunctivae and EOM are normal. Pupils are equal, round, and reactive to light.  Neck: Normal range of motion.  Cardiovascular: Normal rate.   Pulmonary/Chest: Effort normal.  Abdominal: Soft.  Neurological: She is alert.  Skin: Skin is warm.  Psychiatric: She has a normal mood and affect. Her behavior is normal. Judgment and thought content normal.      Assessment:   1.  Breast cancer, right        Plan:     Assessment and Plan:   A long, detailed conversation was had regarding the patient's options for breast reconstruction. Five main points, which are explained to all breast reconstruction patients, were discussed.   1. Breast reconstruction is an optional process.   2. Breast reconstruction is a multi-stage process which involves multiple  surgeries spaced several months apart. The entire process can take over one year.   3. The major goal of breast reconstruction is to have the patient look normal in clothing. When naked, there will always be scars.   4. Asymmetries are often present during the reconstruction process. Several operations may be needed, including surgery to the non-cancerous breast, to achieve satisfactory results.   5. No matter the reconstructive method, there are ways that the reconstruction can fail and a secondary reconstructive plan would need to be created.    A general discussion regarding all  available methods of breast reconstruction were discussed. The types of reconstructions described included.   1. Tissue expander and implant based reconstruction, both single and multi-stage approaches.   2. Autologous only reconstructions, including free abdominal-tissue based reconstructions.   3. Combination procedures, particularly latissismus dorsi flaps combined with either expanders or implants.   For each of the reconstruction methods mentioned above, the risks, benefits, alternatives, scarring, and recovery time were discussed in great detail. Specific risks detailed included bleeding, infection, hematoma, seroma, scarring, pain, wound healing complications, flap loss, fat necrosis, capsular contracture, need for implant removal, donor site complications, bulge, hernia, umbilical necrosis, need for urgent reoperation, and need for dressing changes were discussed.    Assessment   Once all reconstruction options were presented, a focused discussion was had regarding the patient's suitability for each of these procedures.   A total of 50 minutes of face-to-face time was spent in this encounter, of which >50% was spent in counseling.  Recommend right latissimus reconstruction with implant placement for symmetry to the right.

## 2012-11-11 NOTE — Pre-Procedure Instructions (Addendum)
Leslie Salazar  11/11/2012   Your procedure is scheduled on:  August 7  Report to [facility] at 05:30 AM.  Call this number if you have problems the morning of surgery: [phone]   Remember:   Do not eat food or drink liquids after midnight.   Take these medicines the morning of surgery with A SIP OF WATER:, Valtrex   STOP Vitamin D, Multiple Vitamins, Oscal today 8/4  Do not wear jewelry, make-up or nail polish.  Do not wear lotions, powders, or perfumes. You may wear deodorant.  Do not shave 48 hours prior to surgery. Men may shave face and neck.  Do not bring valuables to the hospital.  [facility] is not responsible  for any belongings or valuables.  Contacts, dentures or bridgework may not be worn into surgery.  Leave suitcase in the car. After surgery it may be brought to your room.  For patients admitted to the hospital, checkout time is 11:00 AM the day of discharge.   Patients discharged the day of surgery will not be allowed to drive home.  Name and phone number of your driver: family/ friend  Special Instructions: Shower using CHG 2 nights before surgery and the night before surgery.  If you shower the day of surgery use CHG.  Use special wash - you have one bottle of CHG for all showers.  You should use approximately 1/3 of the bottle for each shower.   Please read over the following fact sheets that you were given: Pain Booklet, Coughing and Deep Breathing and Surgical Site Infection Prevention

## 2012-11-13 ENCOUNTER — Encounter (HOSPITAL_COMMUNITY): Payer: Self-pay | Admitting: Pharmacy Technician

## 2012-11-13 ENCOUNTER — Encounter (HOSPITAL_COMMUNITY): Payer: Self-pay

## 2012-11-13 ENCOUNTER — Ambulatory Visit (HOSPITAL_COMMUNITY)
Admission: RE | Admit: 2012-11-13 | Discharge: 2012-11-13 | Disposition: A | Discharge status: Home / Self Care | Source: Ambulatory Visit | Attending: Anesthesiology | Admitting: Anesthesiology

## 2012-11-13 ENCOUNTER — Encounter (HOSPITAL_COMMUNITY)
Admission: RE | Admit: 2012-11-13 | Discharge: 2012-11-13 | Disposition: A | Discharge status: Home / Self Care | Source: Ambulatory Visit | Attending: Plastic Surgery | Admitting: Plastic Surgery

## 2012-11-13 VITALS — BP 125/84 | HR 74 | Temp 99.0°F | Resp 20 | Ht 62.0 in | Wt 137.7 lb

## 2012-11-13 DIAGNOSIS — Z01818 Encounter for other preprocedural examination: Secondary | ICD-10-CM | POA: Insufficient documentation

## 2012-11-13 DIAGNOSIS — Z01812 Encounter for preprocedural laboratory examination: Secondary | ICD-10-CM | POA: Insufficient documentation

## 2012-11-13 DIAGNOSIS — C50911 Malignant neoplasm of unspecified site of right female breast: Secondary | ICD-10-CM

## 2012-11-13 DIAGNOSIS — Z01810 Encounter for preprocedural cardiovascular examination: Secondary | ICD-10-CM | POA: Insufficient documentation

## 2012-11-13 LAB — BASIC METABOLIC PANEL
Calcium: 9.9 mg/dL (ref 8.4–10.5)
GFR calc non Af Amer: >90 mL/min (ref >90)
Potassium: 3.2 mEq/L — ABNORMAL LOW (ref 3.5–5.1)
Sodium: 140 mEq/L (ref 135–145)

## 2012-11-13 LAB — CBC
Hemoglobin: 13 g/dL (ref 12.0–15.0)
Platelets: 252 10*3/uL (ref 150–400)
RBC: 4.32 MIL/uL (ref 3.87–5.11)
WBC: 4.8 10*3/uL (ref 4.0–10.5)

## 2012-11-13 LAB — HCG, SERUM, QUALITATIVE: Preg, Serum: NEGATIVE

## 2012-11-13 NOTE — Progress Notes (Signed)
req'd  Notes ,prior ekg fro pcp dr Zella Ball sanders

## 2012-11-13 NOTE — Progress Notes (Signed)
Anesthesia Chart Review:  Patient is a 47 year old female scheduled for right latissimus myocutaneous flap and placement of prosthetic on 11/16/12 by Dr. Kelly Splinter (for asymmetry following right chest radiation).  History includes HTN, non-smoker, right breast cancer '12 s/p two lumpectomies followed by bilateral mastectomies 04/2011 and right sided radiation 08/2011, removal of bilateral expanders and placement of breast implants 12/2011, endometrial ablation '07, c-section, HSV-1. PCP is listed as Dr. Dorothyann Peng.  EKG on 11/13/12 showed NSR, minimal voltage criteria for LVH, septal infarct (age undetermined), non-specific ST/T wave abnormality.  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    Preoperative CXR and labs noted.  Patient has no known CAD/MI/CHF or DM history. No CV symptoms were documented at her PAT visit.  She has undergone multiple breast procedures within the past two years.  Her T wave in V2 is slightly negative, but otherwise I think it is overall stable since 01/02/99.  She will be evaluated by her assigned anesthesiologist on the day of surgery, and if no acute changes then I anticipate that she can proceed as planned.  [facility]Anesthesiology Phone [phone] 11/13/2012 2:23 PM

## 2012-11-14 ENCOUNTER — Other Ambulatory Visit: Payer: Self-pay | Admitting: Plastic Surgery

## 2012-11-15 MED ORDER — CEFAZOLIN SODIUM-DEXTROSE 2-3 GM-% IV SOLR
2.0000 g | INTRAVENOUS | Status: AC
  Administered 2012-11-16: 2 g via INTRAVENOUS
  Filled 2012-11-15: qty 50

## 2012-11-16 ENCOUNTER — Encounter (HOSPITAL_COMMUNITY): Payer: Self-pay | Admitting: *Deleted

## 2012-11-16 ENCOUNTER — Inpatient Hospital Stay (HOSPITAL_COMMUNITY)
Admission: RE | Admit: 2012-11-16 | Discharge: 2012-11-19 | DRG: 583 | Disposition: A | Discharge status: Home / Self Care | Source: Ambulatory Visit | Attending: Plastic Surgery | Admitting: Plastic Surgery

## 2012-11-16 ENCOUNTER — Encounter (HOSPITAL_COMMUNITY): Payer: Self-pay | Admitting: Vascular Surgery

## 2012-11-16 ENCOUNTER — Ambulatory Visit (HOSPITAL_COMMUNITY): Admitting: Anesthesiology

## 2012-11-16 ENCOUNTER — Encounter (HOSPITAL_COMMUNITY)
Admission: RE | Disposition: A | Discharge status: Home / Self Care | Payer: Self-pay | Source: Ambulatory Visit | Attending: Plastic Surgery

## 2012-11-16 DIAGNOSIS — C50911 Malignant neoplasm of unspecified site of right female breast: Secondary | ICD-10-CM

## 2012-11-16 DIAGNOSIS — Z79810 Long term (current) use of selective estrogen receptor modulators (SERMs): Secondary | ICD-10-CM

## 2012-11-16 DIAGNOSIS — Z9010 Acquired absence of unspecified breast and nipple: Secondary | ICD-10-CM

## 2012-11-16 DIAGNOSIS — Z79899 Other long term (current) drug therapy: Secondary | ICD-10-CM

## 2012-11-16 DIAGNOSIS — C50919 Malignant neoplasm of unspecified site of unspecified female breast: Principal | ICD-10-CM | POA: Diagnosis present

## 2012-11-16 HISTORY — PX: RECONSTRUCTION BREAST W/ LATISSIMUS DORSI FLAP: SUR1078

## 2012-11-16 HISTORY — PX: BREAST RECONSTRUCTION WITH PLACEMENT OF TISSUE EXPANDER AND FLEX HD (ACELLULAR HYDRATED DERMIS): SHX6295

## 2012-11-16 HISTORY — PX: TISSUE EXPANDER PLACEMENT: SHX2530

## 2012-11-16 LAB — BASIC METABOLIC PANEL
Calcium: 8.7 mg/dL (ref 8.4–10.5)
GFR calc non Af Amer: >90 mL/min (ref >90)
Sodium: 136 mEq/L (ref 135–145)

## 2012-11-16 LAB — CBC
MCH: 30.3 pg (ref 26.0–34.0)
Platelets: 234 10*3/uL (ref 150–400)
RBC: 3.73 MIL/uL — ABNORMAL LOW (ref 3.87–5.11)
WBC: 15.2 10*3/uL — ABNORMAL HIGH (ref 4.0–10.5)

## 2012-11-16 SURGERY — BREAST RECONSTRUCTION WITH PLACEMENT OF TISSUE EXPANDER AND FLEX HD (ACELLULAR HYDRATED DERMIS)
Anesthesia: General | Site: Breast | Laterality: Right | Wound class: Clean

## 2012-11-16 MED ORDER — KCL IN DEXTROSE-NACL 20-5-0.45 MEQ/L-%-% IV SOLN
INTRAVENOUS | Status: AC
  Filled 2012-11-16: qty 1000

## 2012-11-16 MED ORDER — FENTANYL CITRATE 0.05 MG/ML IJ SOLN
INTRAMUSCULAR | Status: DC | PRN
  Administered 2012-11-16: 50 ug via INTRAVENOUS
  Administered 2012-11-16: 100 ug via INTRAVENOUS
  Administered 2012-11-16 (×2): 50 ug via INTRAVENOUS

## 2012-11-16 MED ORDER — PROPOFOL 10 MG/ML IV BOLUS
INTRAVENOUS | Status: DC | PRN
  Administered 2012-11-16: 20 mg via INTRAVENOUS
  Administered 2012-11-16: 200 mg via INTRAVENOUS

## 2012-11-16 MED ORDER — DEXTROSE 5 % IV SOLN
INTRAVENOUS | Status: DC | PRN
  Administered 2012-11-16: 09:00:00 via INTRAVENOUS

## 2012-11-16 MED ORDER — DIPHENHYDRAMINE HCL 50 MG/ML IJ SOLN: 12.5000 mg | Freq: Four times a day (QID) | INTRAMUSCULAR | Status: DC | PRN

## 2012-11-16 MED ORDER — HYDROMORPHONE HCL PF 1 MG/ML IJ SOLN
INTRAMUSCULAR | Status: AC
  Filled 2012-11-16: qty 1

## 2012-11-16 MED ORDER — LIDOCAINE HCL (CARDIAC) 20 MG/ML IV SOLN
INTRAVENOUS | Status: DC | PRN
  Administered 2012-11-16: 40 mg via INTRAVENOUS

## 2012-11-16 MED ORDER — MIDAZOLAM HCL 5 MG/5ML IJ SOLN
INTRAMUSCULAR | Status: DC | PRN
  Administered 2012-11-16: 0.5 mg via INTRAVENOUS

## 2012-11-16 MED ORDER — HYDROCHLOROTHIAZIDE 25 MG PO TABS
25.0000 mg | ORAL_TABLET | Freq: Every day | ORAL | Status: DC
  Administered 2012-11-16 – 2012-11-19 (×4): 25 mg via ORAL
  Filled 2012-11-16 (×6): qty 1

## 2012-11-16 MED ORDER — SODIUM CHLORIDE 0.9 % IR SOLN
Status: DC | PRN
  Administered 2012-11-16: 1

## 2012-11-16 MED ORDER — ONDANSETRON HCL 4 MG/2ML IJ SOLN: 4.0000 mg | Freq: Once | INTRAMUSCULAR | Status: DC | PRN

## 2012-11-16 MED ORDER — EVICEL 5 ML EX KIT
PACK | CUTANEOUS | Status: DC | PRN
  Administered 2012-11-16 (×2): 5 mL

## 2012-11-16 MED ORDER — HYDROMORPHONE 0.3 MG/ML IV SOLN
INTRAVENOUS | Status: DC
  Administered 2012-11-16: 17:00:00 via INTRAVENOUS
  Administered 2012-11-16: 1.5 mg via INTRAVENOUS
  Administered 2012-11-17: 0.9 mg via INTRAVENOUS
  Administered 2012-11-17: 1.2 mg via INTRAVENOUS
  Administered 2012-11-17: 0.3 mg via INTRAVENOUS
  Filled 2012-11-16: qty 25

## 2012-11-16 MED ORDER — SODIUM CHLORIDE 0.9 % IR SOLN
Status: DC | PRN
  Administered 2012-11-16: 09:00:00

## 2012-11-16 MED ORDER — EVICEL 5 ML EX KIT
PACK | CUTANEOUS | Status: AC
  Filled 2012-11-16: qty 2

## 2012-11-16 MED ORDER — ONDANSETRON HCL 4 MG/2ML IJ SOLN
INTRAMUSCULAR | Status: DC | PRN
  Administered 2012-11-16: 4 mg via INTRAVENOUS

## 2012-11-16 MED ORDER — FLEET ENEMA 7-19 GM/118ML RE ENEM
1.0000 | ENEMA | Freq: Once | RECTAL | Status: AC | PRN
  Filled 2012-11-16: qty 1

## 2012-11-16 MED ORDER — KCL IN DEXTROSE-NACL 20-5-0.45 MEQ/L-%-% IV SOLN
INTRAVENOUS | Status: DC
  Administered 2012-11-16 – 2012-11-18 (×3): via INTRAVENOUS
  Filled 2012-11-16 (×8): qty 1000

## 2012-11-16 MED ORDER — SODIUM CHLORIDE 0.9 % IV SOLN
3.0000 g | Freq: Four times a day (QID) | INTRAVENOUS | Status: DC
  Administered 2012-11-16 – 2012-11-19 (×12): 3 g via INTRAVENOUS
  Filled 2012-11-16 (×17): qty 3

## 2012-11-16 MED ORDER — ROCURONIUM BROMIDE 100 MG/10ML IV SOLN
INTRAVENOUS | Status: DC | PRN
  Administered 2012-11-16: 15 mg via INTRAVENOUS
  Administered 2012-11-16: 25 mg via INTRAVENOUS

## 2012-11-16 MED ORDER — ONDANSETRON HCL 4 MG/2ML IJ SOLN: 4.0000 mg | Freq: Four times a day (QID) | INTRAMUSCULAR | Status: DC | PRN

## 2012-11-16 MED ORDER — SODIUM CHLORIDE 0.9 % IJ SOLN: 9.0000 mL | INTRAMUSCULAR | Status: DC | PRN

## 2012-11-16 MED ORDER — GLYCOPYRROLATE 0.2 MG/ML IJ SOLN
INTRAMUSCULAR | Status: DC | PRN
  Administered 2012-11-16: .15 mg via INTRAVENOUS
  Administered 2012-11-16: 0.4 mg via INTRAVENOUS
  Administered 2012-11-16: .05 mg via INTRAVENOUS

## 2012-11-16 MED ORDER — ALBUMIN HUMAN 5 % IV SOLN
INTRAVENOUS | Status: DC | PRN
  Administered 2012-11-16: 11:00:00 via INTRAVENOUS

## 2012-11-16 MED ORDER — SENNOSIDES-DOCUSATE SODIUM 8.6-50 MG PO TABS: 1.0000 | ORAL_TABLET | Freq: Every evening | ORAL | Status: DC | PRN

## 2012-11-16 MED ORDER — DOCUSATE SODIUM 100 MG PO CAPS
100.0000 mg | ORAL_CAPSULE | Freq: Two times a day (BID) | ORAL | Status: DC
  Administered 2012-11-16 – 2012-11-19 (×7): 100 mg via ORAL
  Filled 2012-11-16 (×7): qty 1

## 2012-11-16 MED ORDER — NALOXONE HCL 0.4 MG/ML IJ SOLN: 0.4000 mg | INTRAMUSCULAR | Status: DC | PRN

## 2012-11-16 MED ORDER — BUPIVACAINE-EPINEPHRINE PF 0.25-1:200000 % IJ SOLN
INTRAMUSCULAR | Status: AC
  Filled 2012-11-16: qty 30

## 2012-11-16 MED ORDER — BISACODYL 5 MG PO TBEC: 5.0000 mg | DELAYED_RELEASE_TABLET | Freq: Every day | ORAL | Status: DC | PRN

## 2012-11-16 MED ORDER — DIAZEPAM 5 MG PO TABS
5.0000 mg | ORAL_TABLET | Freq: Four times a day (QID) | ORAL | Status: DC | PRN
  Administered 2012-11-16 – 2012-11-18 (×4): 5 mg via ORAL
  Filled 2012-11-16 (×4): qty 1

## 2012-11-16 MED ORDER — DIPHENHYDRAMINE HCL 12.5 MG/5ML PO ELIX: 12.5000 mg | ORAL_SOLUTION | Freq: Four times a day (QID) | ORAL | Status: DC | PRN

## 2012-11-16 MED ORDER — 0.9 % SODIUM CHLORIDE (POUR BTL) OPTIME
TOPICAL | Status: DC | PRN
  Administered 2012-11-16 (×2): 1000 mL

## 2012-11-16 MED ORDER — HYDROMORPHONE HCL PF 1 MG/ML IJ SOLN: 0.2500 mg | INTRAMUSCULAR | Status: DC | PRN

## 2012-11-16 MED ORDER — HYDROMORPHONE HCL PF 1 MG/ML IJ SOLN
0.2500 mg | INTRAMUSCULAR | Status: DC | PRN
  Administered 2012-11-16 (×2): 0.5 mg via INTRAVENOUS

## 2012-11-16 MED ORDER — NEOSTIGMINE METHYLSULFATE 1 MG/ML IJ SOLN
INTRAMUSCULAR | Status: DC | PRN
  Administered 2012-11-16: 3 mg via INTRAVENOUS

## 2012-11-16 MED ORDER — BUPIVACAINE-EPINEPHRINE 0.25% -1:200000 IJ SOLN
INTRAMUSCULAR | Status: DC | PRN
  Administered 2012-11-16: 10 mL

## 2012-11-16 MED ORDER — LACTATED RINGERS IV SOLN
INTRAVENOUS | Status: DC | PRN
  Administered 2012-11-16 (×3): via INTRAVENOUS

## 2012-11-16 SURGICAL SUPPLY — 60 items
ADH SKN CLS APL DERMABOND .7 (GAUZE/BANDAGES/DRESSINGS) ×2
BAG DECANTER FOR FLEXI CONT (MISCELLANEOUS) ×2 IMPLANT
BINDER BREAST LRG (GAUZE/BANDAGES/DRESSINGS) ×1 IMPLANT
BINDER BREAST XLRG (GAUZE/BANDAGES/DRESSINGS) IMPLANT
BIOPATCH RED 1 DISK 7.0 (GAUZE/BANDAGES/DRESSINGS) ×4 IMPLANT
CANISTER SUCTION 2500CC (MISCELLANEOUS) ×2 IMPLANT
CHLORAPREP W/TINT 26ML (MISCELLANEOUS) ×4 IMPLANT
CLOTH BEACON ORANGE TIMEOUT ST (SAFETY) ×2 IMPLANT
COVER SURGICAL LIGHT HANDLE (MISCELLANEOUS) ×2 IMPLANT
DERMABOND ADVANCED (GAUZE/BANDAGES/DRESSINGS) ×2
DERMABOND ADVANCED .7 DNX12 (GAUZE/BANDAGES/DRESSINGS) ×2 IMPLANT
DRAIN CHANNEL 19F RND (DRAIN) ×6 IMPLANT
DRAPE INCISE IOBAN 66X45 STRL (DRAPES) ×1 IMPLANT
DRAPE ORTHO SPLIT 77X108 STRL (DRAPES) ×8
DRAPE PROXIMA HALF (DRAPES) ×4 IMPLANT
DRAPE SURG 17X23 STRL (DRAPES) IMPLANT
DRAPE SURG ORHT 6 SPLT 77X108 (DRAPES) ×2 IMPLANT
DRAPE WARM FLUID 44X44 (DRAPE) ×2 IMPLANT
DRSG MEPILEX BORDER 4X8 (GAUZE/BANDAGES/DRESSINGS) ×1 IMPLANT
DRSG PAD ABDOMINAL 8X10 ST (GAUZE/BANDAGES/DRESSINGS) ×2 IMPLANT
ELECT BLADE 4.0 EZ CLEAN MEGAD (MISCELLANEOUS) ×2
ELECT REM PT RETURN 9FT ADLT (ELECTROSURGICAL) ×2
ELECTRODE BLDE 4.0 EZ CLN MEGD (MISCELLANEOUS) ×1 IMPLANT
ELECTRODE REM PT RTRN 9FT ADLT (ELECTROSURGICAL) ×1 IMPLANT
EVACUATOR SILICONE 100CC (DRAIN) ×4 IMPLANT
GLOVE BIO SURGEON STRL SZ 6.5 (GLOVE) ×8 IMPLANT
GLOVE BIO SURGEON STRL SZ7.5 (GLOVE) ×4 IMPLANT
GLOVE BIOGEL PI IND STRL 6.5 (GLOVE) IMPLANT
GLOVE BIOGEL PI IND STRL 7.0 (GLOVE) IMPLANT
GLOVE BIOGEL PI IND STRL 8 (GLOVE) ×1 IMPLANT
GLOVE BIOGEL PI INDICATOR 6.5 (GLOVE) ×1
GLOVE BIOGEL PI INDICATOR 7.0 (GLOVE) ×3
GLOVE BIOGEL PI INDICATOR 8 (GLOVE) ×1
GLOVE ORTHOPEDIC STR SZ6.5 (GLOVE) ×2 IMPLANT
GLOVE SURG SS PI 7.0 STRL IVOR (GLOVE) ×1 IMPLANT
GOWN STRL NON-REIN LRG LVL3 (GOWN DISPOSABLE) ×6 IMPLANT
KIT BASIN OR (CUSTOM PROCEDURE TRAY) ×2 IMPLANT
KIT ROOM TURNOVER OR (KITS) ×2 IMPLANT
NEEDLE HYPO 25GX1X1/2 BEV (NEEDLE) ×2 IMPLANT
NS IRRIG 1000ML POUR BTL (IV SOLUTION) ×4 IMPLANT
PACK GENERAL/GYN (CUSTOM PROCEDURE TRAY) ×2 IMPLANT
PAD ARMBOARD 7.5X6 YLW CONV (MISCELLANEOUS) ×6 IMPLANT
PIN SAFETY STERILE (MISCELLANEOUS) ×2 IMPLANT
SET ASEPTIC TRANSFER (MISCELLANEOUS) ×2 IMPLANT
SPONGE GAUZE 4X4 12PLY (GAUZE/BANDAGES/DRESSINGS) IMPLANT
SPONGE LAP 18X18 X RAY DECT (DISPOSABLE) ×1 IMPLANT
STAPLER VISISTAT 35W (STAPLE) IMPLANT
SUT MON AB 5-0 PS2 18 (SUTURE) ×6 IMPLANT
SUT PDS AB 2-0 CT1 27 (SUTURE) IMPLANT
SUT PDS AB 3-0 SH 27 (SUTURE) ×8 IMPLANT
SUT SILK 3 0 SH 30 (SUTURE) ×4 IMPLANT
SUT VIC AB 3-0 SH 27 (SUTURE) ×10
SUT VIC AB 3-0 SH 27X BRD (SUTURE) ×5 IMPLANT
SUT VICRYL 4-0 PS2 18IN ABS (SUTURE) ×6 IMPLANT
SYR BULB IRRIGATION 50ML (SYRINGE) ×1 IMPLANT
SYR CONTROL 10ML LL (SYRINGE) ×2 IMPLANT
TISSUE EXPANDER 350CC (Miscellaneous) ×1 IMPLANT
TOWEL OR 17X24 6PK STRL BLUE (TOWEL DISPOSABLE) ×2 IMPLANT
TOWEL OR 17X26 10 PK STRL BLUE (TOWEL DISPOSABLE) ×4 IMPLANT
TRAY FOLEY CATH 14FRSI W/METER (CATHETERS) ×2 IMPLANT

## 2012-11-16 NOTE — Brief Op Note (Addendum)
11/16/2012  8:22 AM  PATIENT:  Leslie Salazar  47 y.o. 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  PROCEDURE:  Right latissimus myocutaneous flap with expander placement  SURGEON:  Surgeon(s) and Role:    * Anaise Sterbenz Sanger, DO - Primary  PHYSICIAN ASSISTANT: Shawn Rayburn, PA  ASSISTANTS: none   ANESTHESIA:   general  EBL:     BLOOD ADMINISTERED:none  DRAINS: (3) Jackson-Pratt drain(s) with closed bulb suction in the back x 2 and the right chest/breast   LOCAL MEDICATIONS USED:  MARCAINE     SPECIMEN:  Source of Specimen:  right breast skin  DISPOSITION OF SPECIMEN:  PATHOLOGY  COUNTS:  YES  TOURNIQUET:  * No tourniquets in log *  DICTATION: .Dragon Dictation  PLAN OF CARE: Admit to inpatient   PATIENT DISPOSITION:  PACU - hemodynamically stable.   Delay start of Pharmacological VTE agent (>24hrs) due to surgical blood loss or risk of bleeding: no

## 2012-11-16 NOTE — Anesthesia Postprocedure Evaluation (Signed)
  Anesthesia Post-op Note  Patient: Leslie Salazar  Procedure(s) Performed: Procedure(s): RIGHT LATISSIMUS myocutaneous FLAP WITH PLACEMENT OF TISSUE EXPANDER  TO RIGHT BREAST (Right)  Patient Location: PACU  Anesthesia Type:General  Level of Consciousness: awake, alert , oriented and patient cooperative  Airway and Oxygen Therapy: Patient Spontanous Breathing  Post-op Pain: mild  Post-op Assessment: Post-op Vital signs reviewed, Patient's Cardiovascular Status Stable, Respiratory Function Stable, Patent Airway, No signs of Nausea or vomiting and Pain level controlled  Post-op Vital Signs: stable  Complications: No apparent anesthesia complications

## 2012-11-16 NOTE — Anesthesia Preprocedure Evaluation (Addendum)
Anesthesia Evaluation  Patient identified by MRN, date of birth, ID band Patient awake    Reviewed: Allergy & Precautions, H&P , NPO status , Patient's Chart, lab work & pertinent test results, reviewed documented beta blocker date and time   Airway Mallampati: I TM Distance: >3 FB Neck ROM: full    Dental  (+) Teeth Intact and Dental Advisory Given   Pulmonary          Cardiovascular hypertension, Pt. on medications Rhythm:regular Rate:Normal     Neuro/Psych    GI/Hepatic   Endo/Other    Renal/GU      Musculoskeletal   Abdominal   Peds  Hematology   Anesthesia Other Findings   Reproductive/Obstetrics                         Anesthesia Physical Anesthesia Plan  ASA: II  Anesthesia Plan: General   Post-op Pain Management:    Induction: Intravenous  Airway Management Planned: Oral ETT  Additional Equipment:   Intra-op Plan:   Post-operative Plan: Extubation in OR  Informed Consent: I have reviewed the patients History and Physical, chart, labs and discussed the procedure including the risks, benefits and alternatives for the proposed anesthesia with the patient or authorized representative who has indicated his/her understanding and acceptance.     Plan Discussed with: CRNA, Anesthesiologist and Surgeon  Anesthesia Plan Comments:         Anesthesia Quick Evaluation

## 2012-11-16 NOTE — Anesthesia Procedure Notes (Signed)
Procedure Name: Intubation Date/Time: 11/16/2012 8:26 AM Performed by: Darcey Nora B Pre-anesthesia Checklist: Patient identified, Suction available, Patient being monitored and Emergency Drugs available Oxygen Delivery Method: Circle system utilized Preoxygenation: Pre-oxygenation with 100% oxygen Intubation Type: IV induction Ventilation: Mask ventilation without difficulty Laryngoscope Size: Mac and 3 Grade View: Grade II Tube type: Oral Tube size: 7.5 mm Number of attempts: 1 Airway Equipment and Method: Stylet Placement Confirmation: ETT inserted through vocal cords under direct vision,  breath sounds checked- equal and bilateral and positive ETCO2 Secured at: 21 (cm at teeth) cm Tube secured with: Tape Dental Injury: Teeth and Oropharynx as per pre-operative assessment

## 2012-11-16 NOTE — Op Note (Signed)
Preoperative Diagnosis: Breast Cancer s/p Right mastectomy ICD-9 174.9  Postoperative Diagnosis: Same  Procedure: 1. Latissimus flap (left) to reconstruct the breast CPT 19361 2. Tissue expander placement CPT 19357  Surgeon: Sanger  Assistant: Lazaro Arms, PA  EBL: 400 cc  Specimens: None  Drains: 3 total 19 blake round drains  Condition: Stable  Complications: None  Disposition: Recovery Room and then the inpatient service  Procedure in detail:  Patient was seen on the morning of her surgery and marked out for her flap. She was then taken to the operating room and given a general anesthetic. She was given an IV and IV antibiotics. An adequate time out was done. She has SCD's and a foley catheter placed. She was placed into the left lateral decubitus position with all key points padded. She was then prepped and draped in the standard sterile fashion using a chloroprep. The paddle design and position was confirmed. The procedure began by incising the margins of the paddle and dissecting out until all four margins of the muscle were identified. The muscle was then released inferiorly and anteriorly and care was taken not to pick up the serratus anteriorly or the paraspinous muscles posteriorly. The flap was then raised to the scapula and released and rotated medially. Care was taken to protect the vascular pedicle throughout this portion of the procedure. The paddle and muscle looked healthy throughout. The old mastectomy scar was opened and the implant was removed (400 cc silicone). The posterior and anterior pockets were then connected in the plane above the muscle. The muscle from the back and the skin paddle were then rotated into the chest pocket.  The back was closed with 3-0 Vicryl followed by 4-0 Vicryl and a running 5-0 Monocryl. Evicel was placed to help with decreasing seroma formation.  Two JP drains were placed and secured with 3-0 silk to the chest skin.  A sterile dressing was  applied.  The patient was placed in the supine position keeping the anterior chest sterile and reprepping and draping.  The Mentor 350 cc expander was placed under the pectoralis muscle and latissimus and 150 cc of injectable saline was placed.  The flap pedicle was inspected and there was no tension. Dermabond and a protective dressing was applied with a breast binder. The patient was then repositioned onto her back and the chest was prepped and draped with a chloroprep. The pocket on the left was inspected and I made sure it was adequately opened and hemostases. The muscle was then secured superiorly to the pectoralis muscle with 3-0 vicryl. The expander was soaked in triple antibiotic solution and evacuated of air and then filled with 150 cc of sterile saline. The inferior portion of the muscle was tacked to the inframammary fold with 3-0 PDS. One drain was placed on this side and secured with 3-0 Silk. The flap was then closed with 3-0 Vicryl, 4-0 Vicryl followed by 5-0 Monocryl then dermabond.  The patient was then washed off and put into a protective dressing and allowed to wake up and taken to recovery room in stable condition at the end of the case. The family was notified at the end of the case as well.

## 2012-11-16 NOTE — Transfer of Care (Signed)
Immediate Anesthesia Transfer of Care Note  Patient: Leslie Salazar  Procedure(s) Performed: Procedure(s): RIGHT LATISSIMUS myocutaneous FLAP WITH PLACEMENT OF TISSUE EXPANDER  TO RIGHT BREAST (Right)  Patient Location: PACU  Anesthesia Type:General  Level of Consciousness: awake, alert , oriented and patient cooperative  Airway & Oxygen Therapy: Patient Spontanous Breathing and Patient connected to nasal cannula oxygen  Post-op Assessment: Report given to PACU RN, Post -op Vital signs reviewed and stable and Patient moving all extremities  Post vital signs: Reviewed and stable  Complications: No apparent anesthesia complications

## 2012-11-16 NOTE — Interval H&P Note (Signed)
History and Physical Interval Note:  11/16/2012 7:39 AM  Leslie Salazar  has presented today for surgery, with the diagnosis of BREAST CANCER  The various methods of treatment have been discussed with the patient and family. After consideration of risks, benefits and other options for treatment, the patient has consented to  Procedure(s): RIGHT LATISSIMUS FLAP WITH PLACEMENT OF TISSUE EXPANDER AND FLEX HD (ACELLULAR HYDRATED DERMIS) TO RIGHT BREAST (Right) as a surgical intervention .  The patient's history has been reviewed, patient examined, no change in status, stable for surgery.  I have reviewed the patient's chart and labs.  Questions were answered to the patient's satisfaction.     SANGER,Danner Paulding

## 2012-11-16 NOTE — Preoperative (Signed)
Beta Blockers   Reason not to administer Beta Blockers:Not Applicable 

## 2012-11-16 NOTE — H&P (View-Only) (Signed)
 This document contains confidential information from a [facility] medical record system and may be unauthenticated. Release may be made only with a valid authorization or in accordance with applicable policies of Medical Center or its affiliates. This document must be maintained in a secure manner or discarded/destroyed as required by Medical Center policy or by a confidential means such as shredding.    Leslie Salazar  08/15/2012 9:00 AM   Office Visit  MRN:  [id number]   Provider: Claire Sanger, DO  Department: [facility]  Dept Phone: [phone]    Diagnoses    Breast cancer, right    -  Primary   174.9      Reason for Visit   Breast Reconstruction     Vitals - Last Recorded    BP Pulse Ht Wt BMI      135/92  79  1.575 m (5' 2")  61.236 kg (135 lb)  24.69 kg/m2     Subjective:    Patient ID: Leslie Salazar is a 47 y.o. female.  HPI The patient is a 47 yrs old wf here for evaluation for breast reconstruction.  She was diagnosed with infiltrating ductal carcinoma of the right breast in 2012.  She underwent two lumpectomies followed by bilateral mastectomies.  It was T1b N0 M0, ER/PR positive, HER-2/neu positive.  She had right sided radiation which finished in 08/2011. She is negative for BRCA1 and BRCA2 mutations.  She then had expanders placed in 1/13 and then the implants (400 cc silicone smooth round high profile [id number]).  She is on tamoxifen.  She is 5 feet 2 inches tall, weighs 135 and wore a 36C cup bra. She would like to be around a C and B cup size.  She has darkening of the skin on the right due to the radiation and the implant is slightly higher, firmer and more lateral than the left. This is likely due to the radiation.  She is interested in a latissimus on the right to improve the asymmetry and was approved for this via [facility] with Dr. Stein.  The following portions of the patient's history were reviewed and updated as appropriate:  allergies, current medications, past family history, past medical history, past social history, past surgical history and problem list.  Review of Systems  Constitutional: Negative.   HENT: Negative.   Eyes: Negative.   Respiratory: Negative.   Cardiovascular: Negative.   Gastrointestinal: Negative.   Endocrine: Negative.   Genitourinary: Negative.   Hematological: Negative.   Psychiatric/Behavioral: Negative.    Objective:    Physical Exam  Constitutional: She appears well-developed and well-nourished.  HENT:   Head: Normocephalic and atraumatic.  Eyes: Conjunctivae and EOM are normal. Pupils are equal, round, and reactive to light.  Neck: Normal range of motion.  Cardiovascular: Normal rate.   Pulmonary/Chest: Effort normal.  Abdominal: Soft.  Neurological: She is alert.  Skin: Skin is warm.  Psychiatric: She has a normal mood and affect. Her behavior is normal. Judgment and thought content normal.      Assessment:   1.  Breast cancer, right        Plan:     Assessment and Plan:   A long, detailed conversation was had regarding the patient's options for breast reconstruction. Five main points, which are explained to all breast reconstruction patients, were discussed.   1. Breast reconstruction is an optional process.   2. Breast reconstruction is a multi-stage process which involves multiple   surgeries spaced several months apart. The entire process can take over one year.   3. The major goal of breast reconstruction is to have the patient look normal in clothing. When naked, there will always be scars.   4. Asymmetries are often present during the reconstruction process. Several operations may be needed, including surgery to the non-cancerous breast, to achieve satisfactory results.   5. No matter the reconstructive method, there are ways that the reconstruction can fail and a secondary reconstructive plan would need to be created.    A general discussion regarding all  available methods of breast reconstruction were discussed. The types of reconstructions described included.   1. Tissue expander and implant based reconstruction, both single and multi-stage approaches.   2. Autologous only reconstructions, including free abdominal-tissue based reconstructions.   3. Combination procedures, particularly latissismus dorsi flaps combined with either expanders or implants.   For each of the reconstruction methods mentioned above, the risks, benefits, alternatives, scarring, and recovery time were discussed in great detail. Specific risks detailed included bleeding, infection, hematoma, seroma, scarring, pain, wound healing complications, flap loss, fat necrosis, capsular contracture, need for implant removal, donor site complications, bulge, hernia, umbilical necrosis, need for urgent reoperation, and need for dressing changes were discussed.    Assessment   Once all reconstruction options were presented, a focused discussion was had regarding the patient's suitability for each of these procedures.   A total of 50 minutes of face-to-face time was spent in this encounter, of which >50% was spent in counseling.  Recommend right latissimus reconstruction with implant placement for symmetry to the right.    

## 2012-11-17 MED ORDER — ACETAMINOPHEN 500 MG PO TABS
500.0000 mg | ORAL_TABLET | Freq: Two times a day (BID) | ORAL | Status: DC | PRN
  Administered 2012-11-17 – 2012-11-19 (×3): 500 mg via ORAL
  Filled 2012-11-17 (×4): qty 1

## 2012-11-17 MED ORDER — OXYCODONE HCL ER 10 MG PO T12A
10.0000 mg | EXTENDED_RELEASE_TABLET | Freq: Two times a day (BID) | ORAL | Status: DC
  Administered 2012-11-17 – 2012-11-19 (×5): 10 mg via ORAL
  Filled 2012-11-17 (×5): qty 1

## 2012-11-17 MED ORDER — HYDROMORPHONE HCL PF 1 MG/ML IJ SOLN
0.5000 mg | INTRAMUSCULAR | Status: DC | PRN
  Administered 2012-11-18: 0.5 mg via INTRAVENOUS
  Filled 2012-11-17: qty 1

## 2012-11-17 MED ORDER — METHOCARBAMOL 500 MG PO TABS
1000.0000 mg | ORAL_TABLET | Freq: Four times a day (QID) | ORAL | Status: DC | PRN
  Administered 2012-11-17 – 2012-11-19 (×6): 1000 mg via ORAL
  Filled 2012-11-17 (×6): qty 2

## 2012-11-17 MED ORDER — OXYCODONE HCL 5 MG PO TABS
5.0000 mg | ORAL_TABLET | ORAL | Status: DC | PRN
  Administered 2012-11-17 – 2012-11-18 (×3): 5 mg via ORAL
  Filled 2012-11-17 (×3): qty 1

## 2012-11-17 NOTE — Progress Notes (Signed)
1 Day Post-Op  Subjective: Doing well this am, but pain not that well controlled thus far. Have started oxycontin 10 mg BID and will use oxycodone and Dilaudid for break through pain as discussed with Dr. Kelly Splinter.  She has moderate serosang drain OP 350 cc since surgery.  The breast flaps and back all look good, flaps viable and clean, dry and intact.   Objective: Vital signs in last 24 hours: Temp:  [97 F (36.1 C)-99.6 F (37.6 C)] 99.6 F (37.6 C) (08/08 0524) Pulse Rate:  [45-112] 45 (08/08 0210) Resp:  [11-23] 20 (08/08 0829) BP: (112-156)/(69-98) 114/74 mmHg (08/08 0524) SpO2:  [91 %-100 %] 100 % (08/08 0524) Weight:  [63.58 kg (140 lb 2.7 oz)] 63.58 kg (140 lb 2.7 oz) (08/08 0100)    Intake/Output from previous day: 08/07 0701 - 08/08 0700 In: 4433.3 [P.O.:250; I.V.:3933.3; IV Piggyback:250] Out: 681 [Urine:228; Drains:353; Blood:100] Intake/Output this shift:    General appearance: alert, cooperative, appears stated age and mild distress Back: incisions of right upper back are clean, dry and intact Resp: clear to auscultation bilaterally Breasts: Right breast flaps are viable , clean, dry and intact Cardio: regular rate and rhythm  Lab Results:   Recent Labs  11/16/12 1615  WBC 15.2*  HGB 11.3*  HCT 32.3*  PLT 234   BMET  Recent Labs  11/16/12 1615  NA 136  K 3.6  CL 103  CO2 22  GLUCOSE 183*  BUN 8  CREATININE 0.52  CALCIUM 8.7   PT/INR No results found for this basename: LABPROT, INR,  in the last 72 hours ABG No results found for this basename: PHART, PCO2, PO2, HCO3,  in the last 72 hours  Studies/Results: No results found.  Anti-infectives: Anti-infectives   Start     Dose/Rate Route Frequency Ordered Stop   11/16/12 1600  Ampicillin-Sulbactam (UNASYN) 3 g in sodium chloride 0.9 % 100 mL IVPB     3 g 100 mL/hr over 60 Minutes Intravenous Every 6 hours 11/16/12 1525     11/16/12 0912  polymyxin B 500,000 Units, bacitracin 50,000 Units  in sodium chloride irrigation 0.9 % 500 mL irrigation  Status:  Discontinued       As needed 11/16/12 0912 11/16/12 1146   11/16/12 0600  ceFAZolin (ANCEF) IVPB 2 g/50 mL premix     2 g 100 mL/hr over 30 Minutes Intravenous On call to O.R. 11/15/12 1321 11/16/12 0830      Assessment/Plan: s/p Procedure(s): RIGHT LATISSIMUS myocutaneous FLAP WITH PLACEMENT OF TISSUE EXPANDER  TO RIGHT BREAST (Right) POD #1- Doing well thus far. Will change to oral medications to po as noted above as discussed with Dr. Kelly Splinter. Continue JP drains, ambulation . Likely to discharge over weekend if she continues to do well.   LOS: 1 day    Franki Monte 11/17/2012 Plastic Surgery [phone]

## 2012-11-18 LAB — URINALYSIS W MICROSCOPIC + REFLEX CULTURE
Hgb urine dipstick: NEGATIVE
Protein, ur: NEGATIVE mg/dL
Urobilinogen, UA: 0.2 mg/dL (ref 0.0–1.0)

## 2012-11-18 MED ORDER — OXYCODONE HCL 5 MG PO TABS
10.0000 mg | ORAL_TABLET | ORAL | Status: DC | PRN
  Administered 2012-11-18 – 2012-11-19 (×4): 10 mg via ORAL
  Filled 2012-11-18 (×4): qty 2

## 2012-11-18 NOTE — Progress Notes (Signed)
2 Days Post-Op  Subjective: Pt reports still having far amount of pain despite Oxycontin SR 10 mg BID and oxycodone 5 mg for pain. Will increase oxycodone to 10 mg. She is up ambulating some. Reports frequent urination and some low grade fever, so will check UA and CBC.  The right breast flap is viable and without hematoma. No drainage from incisions, JP drainage decreasing and serosang. Back incision is clean, dry and intact.   Objective: Vital signs in last 24 hours: Temp:  [98.6 F (37 C)-102 F (38.9 C)] 99.5 F (37.5 C) (08/09 0532) Pulse Rate:  [77-88] 85 (08/09 0532) Resp:  [18-20] 18 (08/09 0532) BP: (102-120)/(63-84) 103/69 mmHg (08/09 0532) SpO2:  [98 %-100 %] 100 % (08/09 0532) Last BM Date: 11/15/12  Intake/Output from previous day: 08/08 0701 - 08/09 0700 In: 2829.6 [P.O.:690; I.V.:2139.6] Out: 1575 [Urine:1350; Drains:225] Intake/Output this shift:    General appearance: alert, cooperative, appears stated age and mild distress Resp: clear to auscultation bilaterally Cardio: regular rate and rhythm Right breast  The right breast flap is viable and without hematoma. No drainage from incisions, JP drainage decreasing and serosang.  Back incision is clean, dry and intact.   Lab Results:   Recent Labs  11/16/12 1615  WBC 15.2*  HGB 11.3*  HCT 32.3*  PLT 234   BMET  Recent Labs  11/16/12 1615  NA 136  K 3.6  CL 103  CO2 22  GLUCOSE 183*  BUN 8  CREATININE 0.52  CALCIUM 8.7   PT/INR No results found for this basename: LABPROT, INR,  in the last 72 hours ABG No results found for this basename: PHART, PCO2, PO2, HCO3,  in the last 72 hours  Studies/Results: No results found.  Anti-infectives: Anti-infectives   Start     Dose/Rate Route Frequency Ordered Stop   11/16/12 1600  Ampicillin-Sulbactam (UNASYN) 3 g in sodium chloride 0.9 % 100 mL IVPB     3 g 100 mL/hr over 60 Minutes Intravenous Every 6 hours 11/16/12 1525     11/16/12 0912   polymyxin B 500,000 Units, bacitracin 50,000 Units in sodium chloride irrigation 0.9 % 500 mL irrigation  Status:  Discontinued       As needed 11/16/12 0912 11/16/12 1146   11/16/12 0600  ceFAZolin (ANCEF) IVPB 2 g/50 mL premix     2 g 100 mL/hr over 30 Minutes Intravenous On call to O.R. 11/15/12 1321 11/16/12 0830      Assessment/Plan: s/p Procedure(s): RIGHT LATISSIMUS myocutaneous FLAP WITH PLACEMENT OF TISSUE EXPANDER  TO RIGHT BREAST (Right)- POD #2- Continue to mobilize OOB. Check CBC and UA (frequent urination may be due to IVF, so will decrease rate) Continues Unasyn for post op coverage. Flap looks good. Continue medications for pain control as noted above.    LOS: 2 days    Franki Monte 11/18/2012 Plastic Surgery [phone]

## 2012-11-19 LAB — CBC
HCT: 31.4 % — ABNORMAL LOW (ref 36.0–46.0)
MCH: 30.3 pg (ref 26.0–34.0)
MCHC: 34.7 g/dL (ref 30.0–36.0)
MCV: 87.2 fL (ref 78.0–100.0)
RDW: 12.7 % (ref 11.5–15.5)

## 2012-11-19 MED ORDER — POLYETHYLENE GLYCOL 3350 17 G PO PACK
17.0000 g | PACK | Freq: Every day | ORAL | Status: DC
  Administered 2012-11-19: 17 g via ORAL
  Filled 2012-11-19: qty 1

## 2012-11-19 NOTE — Discharge Summary (Signed)
Physician Discharge Summary  Patient ID: Leslie Salazar MRN: [id number] DOB/AGE: January 16, 1966 47 y.o.  Admit date: 11/16/2012 Discharge date: 11/19/2012  Admission Diagnoses:  Discharge Diagnoses:  Active Problems:   Acquired absence of breast and absent nipple   Discharged Condition: good  Hospital Course: The patient was taken to the OR and underwent a right breast reconstruction with a latissimus myocutaneous muscle flap and expander placement.  She did well postoperatively. She was managed on the surgical unit and was voiding, walking and eating without difficulties at the time of discharge.  Her drain output was minimal.  Consults: None  Significant Diagnostic Studies: labs: CBC  Treatments: surgery: right breast reconstruction  Discharge Exam: Blood pressure 105/69, pulse 79, temperature 98 F (36.7 C), temperature source Oral, resp. rate 18, height 5\' 2"  (1.575 m), weight 63.58 kg (140 lb 2.7 oz), SpO2 99.00%. General appearance: alert, cooperative and no distress Skin: Skin color, texture, turgor normal. No rashes or lesions Incision/Wound:all sites healing well and intact with good capillary refill and color.  Disposition:    Future Appointments Provider Department Dept Phone   12/05/2012 9:40 AM Ok Edwards, MD [facility] [phone]       Medication List    ASK your doctor about these medications       cholecalciferol 1000 UNITS tablet  Commonly known as:  VITAMIN D  Take 1,000 Units by mouth daily.     CITRACAL + D PO  Take 1 tablet by mouth 2 (two) times daily. 1200mg  Calcium-1000iu Vitamin D     diazepam 5 MG tablet  Commonly known as:  VALIUM  Take 5 mg by mouth at bedtime as needed for sleep.     hydrochlorothiazide 25 MG tablet  Commonly known as:  HYDRODIURIL  Take 25 mg by mouth daily.     tamoxifen 20 MG tablet  Commonly known as:  NOLVADEX  Take 20 mg by mouth daily.     valACYclovir 500 MG tablet  Commonly  known as:  VALTREX  Take 500 mg by mouth daily.           Follow-up Information   Follow up with [facility], DO In 1 week.   Contact information:   [address] [place] [address] [phone]       Signed: Wayland Denis 11/19/2012, 11:06 AM

## 2012-11-19 NOTE — Progress Notes (Signed)
Have seen and agree. 

## 2012-11-19 NOTE — Progress Notes (Signed)
Have seen and agree.

## 2012-11-19 NOTE — Progress Notes (Signed)
Discharge instructions gone over with patient. Drain care gone over. Follow up appointment is made. Home medications gone over. Prescription was already faxed to patient's pharmacy. Arm precautions and when to call the doctor gone over. Patient is signed up with my chart. Patient verbalized understanding of instructions.

## 2012-11-20 ENCOUNTER — Encounter (HOSPITAL_COMMUNITY): Payer: Self-pay | Admitting: Plastic Surgery

## 2012-12-05 ENCOUNTER — Encounter: Payer: Self-pay | Admitting: Gynecology

## 2012-12-09 ENCOUNTER — Other Ambulatory Visit: Payer: Self-pay | Admitting: Gynecology

## 2012-12-27 ENCOUNTER — Encounter: Payer: Self-pay | Admitting: Gynecology

## 2012-12-27 ENCOUNTER — Other Ambulatory Visit: Payer: Self-pay

## 2013-01-03 ENCOUNTER — Ambulatory Visit (INDEPENDENT_AMBULATORY_CARE_PROVIDER_SITE_OTHER): Admitting: Gynecology

## 2013-01-03 ENCOUNTER — Encounter: Payer: Self-pay | Admitting: Gynecology

## 2013-01-03 ENCOUNTER — Ambulatory Visit (INDEPENDENT_AMBULATORY_CARE_PROVIDER_SITE_OTHER)

## 2013-01-03 VITALS — BP 124/78 | Ht 62.25 in | Wt 138.0 lb

## 2013-01-03 DIAGNOSIS — D251 Intramural leiomyoma of uterus: Secondary | ICD-10-CM

## 2013-01-03 DIAGNOSIS — D252 Subserosal leiomyoma of uterus: Secondary | ICD-10-CM

## 2013-01-03 DIAGNOSIS — N8300 Follicular cyst of ovary, unspecified side: Secondary | ICD-10-CM

## 2013-01-03 DIAGNOSIS — N942 Vaginismus: Secondary | ICD-10-CM

## 2013-01-03 DIAGNOSIS — Z853 Personal history of malignant neoplasm of breast: Secondary | ICD-10-CM

## 2013-01-03 DIAGNOSIS — D259 Leiomyoma of uterus, unspecified: Secondary | ICD-10-CM

## 2013-01-03 DIAGNOSIS — Z01419 Encounter for gynecological examination (general) (routine) without abnormal findings: Secondary | ICD-10-CM

## 2013-01-03 DIAGNOSIS — N952 Postmenopausal atrophic vaginitis: Secondary | ICD-10-CM

## 2013-01-03 MED ORDER — VALACYCLOVIR HCL 500 MG PO TABS: 500.0000 mg | ORAL_TABLET | Freq: Every day | ORAL | Status: DC

## 2013-01-03 NOTE — Progress Notes (Signed)
Leslie Salazar [facility] 29-Aug-1965 [id number]   History:    47 y.o.  for annual gyn exam with some complaints of vaginal dryness. 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 Patient also was diagnosed in 2012 with 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 She was tested for the BRCA1 and BRCA2 N. no mutations were detected. She completed radiation back last year and has been on tamoxifen and reports no vaginal bleeding.She's been followed by the oncologist Dr. Mathis Bud in [place]. In 1994 patient had CO2 laser of cervical dysplasia at another practice. Ever since then her Pap smears have been normal. 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 Patient recently had breast reconstruction. Patient's primary doctor Velna Hatchet has been doing her blood work.   Past medical history,surgical history, family history and social history were all reviewed and documented in the EPIC chart.  Gynecologic History No LMP recorded. Patient has had an ablation. Contraception: post menopausal status Last Pap: 2012. Results were: normal Last mammogram: see above. Results were: see above  Obstetric History OB History  Gravida Para Term Preterm AB SAB TAB Ectopic Multiple Living  1 1 1       1     # Outcome Date GA Lbr Len/2nd Weight Sex Delivery Anes PTL Lv  1 TRM     M CS  N Y       ROS: A ROS was performed and pertinent positives and negatives are included in the history.  GENERAL: No fevers or chills. HEENT: No change in vision, no earache, sore throat or sinus congestion. NECK: No pain or stiffness. CARDIOVASCULAR: No chest pain or pressure. No palpitations. PULMONARY: No shortness of breath, cough or wheeze. GASTROINTESTINAL: No abdominal pain, nausea, vomiting or diarrhea, melena or bright red blood per rectum. GENITOURINARY: No urinary  frequency, urgency, hesitancy or dysuria. MUSCULOSKELETAL: No joint or muscle pain, no back pain, no recent trauma. DERMATOLOGIC: No rash, no itching, no lesions. ENDOCRINE: No polyuria, polydipsia, no heat or cold intolerance. No recent change in weight. HEMATOLOGICAL: No anemia or easy bruising or bleeding. NEUROLOGIC: No headache, seizures, numbness, tingling or weakness. PSYCHIATRIC: No depression, no loss of interest in normal activity or change in sleep pattern.     Exam: chaperone present  BP 124/78  Ht 5' 2.25" (1.581 m)  Wt 138 lb (62.596 kg)  BMI 25.04 kg/m2  Body mass index is 25.04 kg/(m^2).  General appearance : Well developed well nourished female. No acute distress HEENT: Neck supple, trachea midline, no carotid bruits, no thyroidmegaly Lungs: Clear to auscultation, no rhonchi or wheezes, or rib retractions  Heart: Regular rate and rhythm, no murmurs or gallops Breastbreast reconstruction bilateral from prior mastectomyAbdomen: no palpable masses or tenderness, no rebound or guarding Extremities: no edema or skin discoloration or tenderness  Pelvic:  Bartholin, Urethra, Skene Glands: Within normal limits             Vagina: No gross lesions or discharge  Cervix: No gross lesions or discharge  Uterus  anteverted, normal size, shape and consistency, non-tender and mobile  Adnexa  Without masses or tenderness  Anus and perineum  normal   Rectovaginal  normal sphincter tone without palpated masses or tenderness             Hemoccult card provided     Assessment/Plan:  47 y.o. female with history of infiltrating ductal  carcinoma of the right breast T1b N0 M0 (estrogen receptor and progesterone receptor positive, HER-2/neu 2pos and had bilateral mastectomy. She was tested for the BRCA1 and BRCA2 N. no mutations were detected who is doing well currently on tamoxifen. The patient had an ultrasound today with endometrial stripe 3.9 mm and right atrophic normal ovary and left  ovary with a small 18 mm follicle and no fluid in the cul-de-sac. In an effort to help with vaginal dryness and she was given a sample of Hyalo Gyn hydrating gel which is nonhormonal and she can use when necessary. Her PCP is doing her lab work. She was reminded summoned to the office for Hemoccult card for testing. She had a normal colonoscopy in 2013. We discussed importance of calcium vitamin D and regular exercise for osteoporosis prevention. Next year we will plan on getting a baseline bone density study.  Ok Edwards MD, 1:50 PM 01/03/2013

## 2013-01-03 NOTE — Patient Instructions (Addendum)

## 2013-01-03 NOTE — Addendum Note (Signed)
Addended by: Richardson Chiquito on: 01/03/2013 02:43 PM   Modules accepted: Orders

## 2013-01-08 NOTE — Progress Notes (Signed)
rx called in KW 

## 2013-02-15 ENCOUNTER — Other Ambulatory Visit: Payer: Self-pay

## 2013-03-06 ENCOUNTER — Encounter (HOSPITAL_BASED_OUTPATIENT_CLINIC_OR_DEPARTMENT_OTHER): Payer: Self-pay | Admitting: *Deleted

## 2013-03-06 NOTE — Progress Notes (Signed)
To come in for bmet-had ekg 8/14 when had previous surgery

## 2013-03-07 ENCOUNTER — Other Ambulatory Visit: Payer: Self-pay | Admitting: Plastic Surgery

## 2013-03-07 ENCOUNTER — Encounter (HOSPITAL_BASED_OUTPATIENT_CLINIC_OR_DEPARTMENT_OTHER)
Admission: RE | Admit: 2013-03-07 | Discharge: 2013-03-07 | Disposition: A | Discharge status: Home / Self Care | Source: Ambulatory Visit | Attending: Plastic Surgery | Admitting: Plastic Surgery

## 2013-03-07 DIAGNOSIS — Z01818 Encounter for other preprocedural examination: Secondary | ICD-10-CM | POA: Insufficient documentation

## 2013-03-07 DIAGNOSIS — Z9011 Acquired absence of right breast and nipple: Secondary | ICD-10-CM

## 2013-03-07 DIAGNOSIS — Z01812 Encounter for preprocedural laboratory examination: Secondary | ICD-10-CM | POA: Insufficient documentation

## 2013-03-07 LAB — BASIC METABOLIC PANEL
BUN: 13 mg/dL (ref 6–23)
Calcium: 9.6 mg/dL (ref 8.4–10.5)
Creatinine, Ser: 0.59 mg/dL (ref 0.50–1.10)
GFR calc Af Amer: >90 mL/min (ref >90)
GFR calc non Af Amer: >90 mL/min (ref >90)
Potassium: 4 mEq/L (ref 3.5–5.1)

## 2013-03-07 NOTE — H&P (Signed)
  This document contains confidential information from a [facility] medical record system and may be unauthenticated. Release may be made only with a valid authorization or in accordance with applicable policies of Medical Center or its affiliates. This document must be maintained in a secure manner or discarded/destroyed as required by Medical Center policy or by a confidential means such as shredding.     [name]  03/06/2013 11:00 AM   Office Visit  MRN:  [id number]   Department: [facility]  Dept Phone: [phone]   Description: Female DOB: [birth year]  Provider: Wayland Denis, DO   Diagnoses    Acquired absence of bilateral breasts and nipples    -  Primary   V45.71      Reason for Visit -  Breast Reconstruction      Vitals - Last Recorded    125/88  69  98.6 F (37 C)  1.575 m (5' 2.01")  62.596 kg (138 lb)  25.23 kg/m2      Subjective:     Patient ID: Leslie Salazar is a 47 y.o. female.  HPI The patient is a 47 yrs old wf here for a post operative follow up after right breast reconstruction with a latissimus/expander placement 11/16/2012. She was diagnosed with infiltrating ductal carcinoma of the right breast in 2012. She underwent two lumpectomies followed by bilateral mastectomies. It was T1b N0 M0, ER/PR positive, HER-2/neu positive. She had right sided radiation which finished in 08/2011. She is negative for BRCA1 and BRCA2 mutations. She then had expanders placed in 1/13 and then the implants (400 cc silicone smooth round high profile 350-4004BC). She is on tamoxifen. She is 5 feet 2 inches tall, weighs 135 and wore a 36C cup bra. She would like to be around a C and B cup size. She has darkening of the skin on the right due to the radiation. She underwent a right latissimus myocutaneous flap with an expander placement. The right expander has 390/350 cc and we will place a little more to have the extra room to try and move the flap more  medially.  The following portions of the patient's history were reviewed and updated as appropriate: allergies, current medications, past family history, past medical history, past social history, past surgical history and problem list.  Review of Systems  All other systems reviewed and are negative.     Objective:    Physical Exam  Constitutional: She appears well-developed and well-nourished.  HENT:   Head: Normocephalic and atraumatic.  Eyes: Conjunctivae are normal. Pupils are equal, round, and reactive to light.  Cardiovascular: Normal rate.   Pulmonary/Chest: Effort normal.  Musculoskeletal: Normal range of motion.  Neurological: She is alert.  Skin: Skin is warm.  Psychiatric: She has a normal mood and affect. Her behavior is normal. Judgment and thought content normal.      Assessment:   1.  Acquired absence of bilateral breasts and nipples        Plan:     We placed injectable saline in the Right: 50 cc for a total of 440/350 cc It was done under sterile conditions and the patient tolerated it well. Will will remove the expander and place a silicone implant on the right.  The left was already done by another surgeon.

## 2013-03-14 ENCOUNTER — Encounter (HOSPITAL_BASED_OUTPATIENT_CLINIC_OR_DEPARTMENT_OTHER): Admitting: Anesthesiology

## 2013-03-14 ENCOUNTER — Ambulatory Visit (HOSPITAL_BASED_OUTPATIENT_CLINIC_OR_DEPARTMENT_OTHER): Admitting: Anesthesiology

## 2013-03-14 ENCOUNTER — Encounter (HOSPITAL_BASED_OUTPATIENT_CLINIC_OR_DEPARTMENT_OTHER)
Admission: RE | Disposition: A | Discharge status: Home / Self Care | Payer: Self-pay | Source: Ambulatory Visit | Attending: Plastic Surgery

## 2013-03-14 ENCOUNTER — Other Ambulatory Visit: Payer: Self-pay | Admitting: Plastic Surgery

## 2013-03-14 ENCOUNTER — Ambulatory Visit (HOSPITAL_BASED_OUTPATIENT_CLINIC_OR_DEPARTMENT_OTHER)
Admission: RE | Admit: 2013-03-14 | Discharge: 2013-03-14 | Disposition: A | Discharge status: Home / Self Care | Source: Ambulatory Visit | Attending: Plastic Surgery | Admitting: Plastic Surgery

## 2013-03-14 ENCOUNTER — Encounter (HOSPITAL_BASED_OUTPATIENT_CLINIC_OR_DEPARTMENT_OTHER): Payer: Self-pay | Admitting: Anesthesiology

## 2013-03-14 DIAGNOSIS — Z421 Encounter for breast reconstruction following mastectomy: Secondary | ICD-10-CM | POA: Insufficient documentation

## 2013-03-14 DIAGNOSIS — Z853 Personal history of malignant neoplasm of breast: Secondary | ICD-10-CM | POA: Insufficient documentation

## 2013-03-14 DIAGNOSIS — I10 Essential (primary) hypertension: Secondary | ICD-10-CM | POA: Insufficient documentation

## 2013-03-14 DIAGNOSIS — Z9011 Acquired absence of right breast and nipple: Secondary | ICD-10-CM

## 2013-03-14 DIAGNOSIS — Z9010 Acquired absence of unspecified breast and nipple: Secondary | ICD-10-CM | POA: Insufficient documentation

## 2013-03-14 DIAGNOSIS — Z923 Personal history of irradiation: Secondary | ICD-10-CM | POA: Insufficient documentation

## 2013-03-14 HISTORY — DX: Other specified postprocedural states: R11.2

## 2013-03-14 HISTORY — DX: Other complications of anesthesia, initial encounter: T88.59XA

## 2013-03-14 HISTORY — PX: REMOVAL OF TISSUE EXPANDER AND PLACEMENT OF IMPLANT: SHX6457

## 2013-03-14 HISTORY — DX: Presence of spectacles and contact lenses: Z97.3

## 2013-03-14 HISTORY — PX: LIPOSUCTION WITH LIPOFILLING: SHX6436

## 2013-03-14 HISTORY — DX: Other specified postprocedural states: Z98.890

## 2013-03-14 HISTORY — DX: Adverse effect of unspecified anesthetic, initial encounter: T41.45XA

## 2013-03-14 LAB — POCT HEMOGLOBIN-HEMACUE: Hemoglobin: 12.4 g/dL (ref 12.0–15.0)

## 2013-03-14 SURGERY — REMOVAL, TISSUE EXPANDER, BREAST, WITH IMPLANT INSERTION
Anesthesia: General | Site: Breast | Laterality: Right

## 2013-03-14 MED ORDER — LIDOCAINE-EPINEPHRINE 1 %-1:100000 IJ SOLN
INTRAMUSCULAR | Status: DC | PRN
  Administered 2013-03-14: 2 mL

## 2013-03-14 MED ORDER — DEXAMETHASONE SODIUM PHOSPHATE 4 MG/ML IJ SOLN
INTRAMUSCULAR | Status: DC | PRN
  Administered 2013-03-14: 10 mg via INTRAVENOUS

## 2013-03-14 MED ORDER — MIDAZOLAM HCL 5 MG/5ML IJ SOLN
INTRAMUSCULAR | Status: DC | PRN
  Administered 2013-03-14: 2 mg via INTRAVENOUS

## 2013-03-14 MED ORDER — CEFAZOLIN SODIUM-DEXTROSE 2-3 GM-% IV SOLR
2.0000 g | INTRAVENOUS | Status: AC
  Administered 2013-03-14: 2 g via INTRAVENOUS

## 2013-03-14 MED ORDER — MIDAZOLAM HCL 2 MG/2ML IJ SOLN: 1.0000 mg | INTRAMUSCULAR | Status: DC | PRN

## 2013-03-14 MED ORDER — LACTATED RINGERS IV SOLN
INTRAVENOUS | Status: DC
  Administered 2013-03-14 (×2): via INTRAVENOUS

## 2013-03-14 MED ORDER — FENTANYL CITRATE 0.05 MG/ML IJ SOLN: 50.0000 ug | INTRAMUSCULAR | Status: DC | PRN

## 2013-03-14 MED ORDER — LIDOCAINE HCL (CARDIAC) 20 MG/ML IV SOLN
INTRAVENOUS | Status: DC | PRN
  Administered 2013-03-14: 100 mg via INTRAVENOUS

## 2013-03-14 MED ORDER — HYDROMORPHONE HCL PF 1 MG/ML IJ SOLN
INTRAMUSCULAR | Status: AC
  Filled 2013-03-14: qty 1

## 2013-03-14 MED ORDER — PROPOFOL 10 MG/ML IV BOLUS
INTRAVENOUS | Status: AC
  Filled 2013-03-14: qty 20

## 2013-03-14 MED ORDER — HYDROMORPHONE HCL PF 1 MG/ML IJ SOLN
0.2500 mg | INTRAMUSCULAR | Status: DC | PRN
  Administered 2013-03-14 (×3): 0.5 mg via INTRAVENOUS

## 2013-03-14 MED ORDER — OXYCODONE HCL 5 MG PO TABS
5.0000 mg | ORAL_TABLET | Freq: Once | ORAL | Status: DC | PRN
Start: 2013-03-14 — End: 2013-03-14

## 2013-03-14 MED ORDER — HYDROCODONE-ACETAMINOPHEN 5-325 MG PO TABS: 1.0000 | ORAL_TABLET | Freq: Four times a day (QID) | ORAL | Status: DC | PRN

## 2013-03-14 MED ORDER — PROMETHAZINE HCL 25 MG/ML IJ SOLN
INTRAMUSCULAR | Status: AC
  Filled 2013-03-14: qty 1

## 2013-03-14 MED ORDER — ONDANSETRON HCL 4 MG/2ML IJ SOLN
INTRAMUSCULAR | Status: DC | PRN
  Administered 2013-03-14: 4 mg via INTRAVENOUS

## 2013-03-14 MED ORDER — MEPERIDINE HCL 25 MG/ML IJ SOLN: 6.2500 mg | INTRAMUSCULAR | Status: DC | PRN

## 2013-03-14 MED ORDER — FENTANYL CITRATE 0.05 MG/ML IJ SOLN
INTRAMUSCULAR | Status: DC | PRN
  Administered 2013-03-14: 100 ug via INTRAVENOUS
  Administered 2013-03-14: 50 ug via INTRAVENOUS

## 2013-03-14 MED ORDER — SODIUM CHLORIDE 0.9 % IR SOLN
Status: DC | PRN
  Administered 2013-03-14: 09:00:00

## 2013-03-14 MED ORDER — FENTANYL CITRATE 0.05 MG/ML IJ SOLN
INTRAMUSCULAR | Status: AC
  Filled 2013-03-14: qty 6

## 2013-03-14 MED ORDER — ONDANSETRON HCL 4 MG/2ML IJ SOLN: 4.0000 mg | Freq: Once | INTRAMUSCULAR | Status: DC | PRN

## 2013-03-14 MED ORDER — MIDAZOLAM HCL 2 MG/2ML IJ SOLN
INTRAMUSCULAR | Status: AC
  Filled 2013-03-14: qty 2

## 2013-03-14 MED ORDER — BUPIVACAINE-EPINEPHRINE PF 0.25-1:200000 % IJ SOLN
INTRAMUSCULAR | Status: AC
  Filled 2013-03-14: qty 30

## 2013-03-14 MED ORDER — LIDOCAINE HCL (PF) 1 % IJ SOLN
INTRAMUSCULAR | Status: AC
  Filled 2013-03-14: qty 60

## 2013-03-14 MED ORDER — LIDOCAINE-EPINEPHRINE 1 %-1:100000 IJ SOLN
INTRAMUSCULAR | Status: AC
  Filled 2013-03-14: qty 1

## 2013-03-14 MED ORDER — PROMETHAZINE HCL 25 MG/ML IJ SOLN
12.5000 mg | Freq: Once | INTRAMUSCULAR | Status: AC
  Administered 2013-03-14: 12.5 mg via INTRAVENOUS

## 2013-03-14 MED ORDER — EPINEPHRINE HCL 1 MG/ML IJ SOLN
INTRAMUSCULAR | Status: AC
  Filled 2013-03-14: qty 1

## 2013-03-14 MED ORDER — OXYCODONE HCL 5 MG/5ML PO SOLN: 5.0000 mg | Freq: Once | ORAL | Status: DC | PRN

## 2013-03-14 MED ORDER — PROPOFOL 10 MG/ML IV BOLUS
INTRAVENOUS | Status: DC | PRN
  Administered 2013-03-14: 150 mg via INTRAVENOUS

## 2013-03-14 SURGICAL SUPPLY — 76 items
3.0 PDS SUTURE ×3 IMPLANT
ADH SKN CLS APL DERMABOND .7 (GAUZE/BANDAGES/DRESSINGS) ×4
BAG DECANTER FOR FLEXI CONT (MISCELLANEOUS) ×3 IMPLANT
BINDER BREAST LRG (GAUZE/BANDAGES/DRESSINGS) ×2 IMPLANT
BINDER BREAST MEDIUM (GAUZE/BANDAGES/DRESSINGS) IMPLANT
BINDER BREAST XLRG (GAUZE/BANDAGES/DRESSINGS) IMPLANT
BINDER BREAST XXLRG (GAUZE/BANDAGES/DRESSINGS) IMPLANT
BIOPATCH RED 1 DISK 7.0 (GAUZE/BANDAGES/DRESSINGS) IMPLANT
BLADE HEX COATED 2.75 (ELECTRODE) ×3 IMPLANT
BLADE SURG 10 STRL SS (BLADE) IMPLANT
BLADE SURG 15 STRL LF DISP TIS (BLADE) ×2 IMPLANT
BLADE SURG 15 STRL SS (BLADE) ×3
BNDG GAUZE ELAST 4 BULKY (GAUZE/BANDAGES/DRESSINGS) ×6 IMPLANT
CANISTER LINER 1300 C W/ELBOW (MISCELLANEOUS) IMPLANT
CANISTER LIPO FAT HARVEST (MISCELLANEOUS) ×2 IMPLANT
CANISTER SUCT 1200ML W/VALVE (MISCELLANEOUS) ×3 IMPLANT
CANNULA ASPIRATION (CANNULA) ×1 IMPLANT
CHLORAPREP W/TINT 26ML (MISCELLANEOUS) ×3 IMPLANT
CORDS BIPOLAR (ELECTRODE) IMPLANT
COVER MAYO STAND STRL (DRAPES) ×3 IMPLANT
COVER TABLE BACK 60X90 (DRAPES) ×3 IMPLANT
DECANTER SPIKE VIAL GLASS SM (MISCELLANEOUS) IMPLANT
DERMABOND ADVANCED (GAUZE/BANDAGES/DRESSINGS) ×2
DERMABOND ADVANCED .7 DNX12 (GAUZE/BANDAGES/DRESSINGS) ×4 IMPLANT
DRAIN CHANNEL 19F RND (DRAIN) IMPLANT
DRAPE LAPAROSCOPIC ABDOMINAL (DRAPES) ×3 IMPLANT
ELECT BLADE 4.0 EZ CLEAN MEGAD (MISCELLANEOUS) ×3
ELECT REM PT RETURN 9FT ADLT (ELECTROSURGICAL) ×3
ELECTRODE BLDE 4.0 EZ CLN MEGD (MISCELLANEOUS) ×2 IMPLANT
ELECTRODE REM PT RTRN 9FT ADLT (ELECTROSURGICAL) ×2 IMPLANT
EVACUATOR SILICONE 100CC (DRAIN) IMPLANT
FILTER LIPOSUCTION (MISCELLANEOUS) ×1 IMPLANT
GAUZE SPONGE 4X4 12PLY STRL LF (GAUZE/BANDAGES/DRESSINGS) IMPLANT
GLOVE BIO SURGEON STRL SZ 6.5 (GLOVE) ×6 IMPLANT
GOWN PREVENTION PLUS XLARGE (GOWN DISPOSABLE) ×6 IMPLANT
IMPL GEL HI PROFILE 350CC (Breast) IMPLANT
IMPLANT GEL HI PROFILE 350CC (Breast) ×3 IMPLANT
IV NS 1000ML (IV SOLUTION)
IV NS 1000ML BAXH (IV SOLUTION) IMPLANT
IV NS 500ML (IV SOLUTION) ×3
IV NS 500ML BAXH (IV SOLUTION) ×2 IMPLANT
KIT FILL SYSTEM UNIVERSAL (SET/KITS/TRAYS/PACK) ×3 IMPLANT
NDL HYPO 25X1 1.5 SAFETY (NEEDLE) IMPLANT
NDL SAFETY ECLIPSE 18X1.5 (NEEDLE) ×2 IMPLANT
NEEDLE HYPO 18GX1.5 SHARP (NEEDLE) ×3
NEEDLE HYPO 25X1 1.5 SAFETY (NEEDLE) IMPLANT
NEEDLE SPNL 18GX3.5 QUINCKE PK (NEEDLE) ×3 IMPLANT
NS IRRIG 1000ML POUR BTL (IV SOLUTION) IMPLANT
PACK BASIN DAY SURGERY FS (CUSTOM PROCEDURE TRAY) ×3 IMPLANT
PAD ABD 8X10 STRL (GAUZE/BANDAGES/DRESSINGS) ×6 IMPLANT
PAD ALCOHOL SWAB (MISCELLANEOUS) ×3 IMPLANT
PENCIL BUTTON HOLSTER BLD 10FT (ELECTRODE) ×3 IMPLANT
PIN SAFETY STERILE (MISCELLANEOUS) IMPLANT
SIZER BREAST REUSE STRL 350CC (SIZER) ×3
SIZER BRST REUSE STRL 350CC (SIZER) ×1 IMPLANT
SIZER GENERIC MENTOR (SIZER) ×3 IMPLANT
SLEEVE SCD COMPRESS KNEE MED (MISCELLANEOUS) ×3 IMPLANT
SPONGE LAP 18X18 X RAY DECT (DISPOSABLE) ×6 IMPLANT
SUT MNCRL AB 3-0 PS2 18 (SUTURE) ×1 IMPLANT
SUT MNCRL AB 4-0 PS2 18 (SUTURE) ×10 IMPLANT
SUT MON AB 5-0 PS2 18 (SUTURE) ×2 IMPLANT
SUT PDS AB 2-0 CT2 27 (SUTURE) IMPLANT
SUT VIC AB 3-0 SH 27 (SUTURE) ×3
SUT VIC AB 3-0 SH 27X BRD (SUTURE) ×2 IMPLANT
SUT VICRYL 4-0 PS2 18IN ABS (SUTURE) IMPLANT
SYR 20CC LL (SYRINGE) IMPLANT
SYR 50ML LL SCALE MARK (SYRINGE) ×3 IMPLANT
SYR BULB IRRIGATION 50ML (SYRINGE) ×3 IMPLANT
SYR CONTROL 10ML LL (SYRINGE) ×3 IMPLANT
SYR TB 1ML LL NO SAFETY (SYRINGE) ×3 IMPLANT
SYRINGE TOOMEY DISP (SYRINGE) IMPLANT
TOWEL OR 17X24 6PK STRL BLUE (TOWEL DISPOSABLE) ×6 IMPLANT
TUBE CONNECTING 20X1/4 (TUBING) ×3 IMPLANT
TUBING SET GRADUATE ASPIR 12FT (MISCELLANEOUS) IMPLANT
UNDERPAD 30X30 INCONTINENT (UNDERPADS AND DIAPERS) ×6 IMPLANT
YANKAUER SUCT BULB TIP NO VENT (SUCTIONS) ×3 IMPLANT

## 2013-03-14 NOTE — Anesthesia Postprocedure Evaluation (Signed)
Anesthesia Post Note  Patient: Leslie Salazar  Procedure(s) Performed: Procedure(s) (LRB): REMOVAL OF RIGHT BREAST TISSUE EXPANDER WITH PLACEMENT OF RIGHT BREAST IMPLANT (Right) LIPOSUCTION WITH LIPOFILLING (Right)  Anesthesia type: general  Patient location: PACU  Post pain: Pain level controlled  Post assessment: Patient's Cardiovascular Status Stable  Last Vitals:  Filed Vitals:   03/14/13 1303  BP: 117/87  Pulse: 90  Temp: 36.4 C  Resp: 18    Post vital signs: Reviewed and stable  Level of consciousness: sedated  Complications: No apparent anesthesia complications

## 2013-03-14 NOTE — Brief Op Note (Signed)
03/14/2013  10:06 AM  PATIENT:  Leslie Salazar  47 y.o. female  PRE-OPERATIVE DIAGNOSIS:  HISTORY OF BREAST CANCER  POST-OPERATIVE DIAGNOSIS:  HISTORY OF BREAST CANCER  PROCEDURE:  Procedure(s) with comments: REMOVAL OF RIGHT BREAST TISSUE EXPANDER WITH PLACEMENT OF RIGHT BREAST IMPLANT (Right) - Removal of Right Breast Tissue Expander with Placement of Right Breast Implant, Possible Lipsuction with Lipofilling LIPOSUCTION WITH LIPOFILLING (Right)  SURGEON:  Surgeon(s) and Role:    * Nitza Schmid Sanger, DO - Primary  PHYSICIAN ASSISTANT: Shawn Rayburn, PA  ASSISTANTS: none   ANESTHESIA:   general  EBL:  Total I/O In: 1300 [I.V.:1300] Out: -   BLOOD ADMINISTERED:none  DRAINS: none   LOCAL MEDICATIONS USED:  LIDOCAINE   SPECIMEN:  No Specimen  DISPOSITION OF SPECIMEN:  N/A  COUNTS:  YES  TOURNIQUET:  * No tourniquets in log *  DICTATION: .Dragon Dictation  PLAN OF CARE: Discharge to home after PACU  PATIENT DISPOSITION:  PACU - hemodynamically stable.   Delay start of Pharmacological VTE agent (>24hrs) due to surgical blood loss or risk of bleeding: no

## 2013-03-14 NOTE — Anesthesia Preprocedure Evaluation (Addendum)
Anesthesia Evaluation  Patient identified by MRN, date of birth, ID band Patient awake    Reviewed: Allergy & Precautions, H&P , NPO status , Patient's Chart, lab work & pertinent test results  History of Anesthesia Complications (+) PONVNegative for: history of anesthetic complications  Airway Mallampati: I TM Distance: >3 FB Neck ROM: Full    Dental   Pulmonary neg pulmonary ROS,          Cardiovascular hypertension, Pt. on medications     Neuro/Psych negative neurological ROS  negative psych ROS   GI/Hepatic   Endo/Other    Renal/GU      Musculoskeletal   Abdominal   Peds  Hematology   Anesthesia Other Findings   Reproductive/Obstetrics                          Anesthesia Physical Anesthesia Plan  ASA: II  Anesthesia Plan: General   Post-op Pain Management:    Induction: Intravenous  Airway Management Planned: LMA  Additional Equipment:   Intra-op Plan:   Post-operative Plan: Extubation in OR  Informed Consent: I have reviewed the patients History and Physical, chart, labs and discussed the procedure including the risks, benefits and alternatives for the proposed anesthesia with the patient or authorized representative who has indicated his/her understanding and acceptance.     Plan Discussed with: CRNA and Surgeon  Anesthesia Plan Comments:         Anesthesia Quick Evaluation

## 2013-03-14 NOTE — Interval H&P Note (Signed)
History and Physical Interval Note:  03/14/2013 8:11 AM  Leslie Salazar  has presented today for surgery, with the diagnosis of HISTORY OF BREAST CANCER  The various methods of treatment have been discussed with the patient and family. After consideration of risks, benefits and other options for treatment, the patient has consented to  Procedure(s) with comments: REMOVAL OF RIGHT BREAST TISSUE EXPANDER WITH PLACEMENT OF RIGHT BREAST IMPLANT (Right) - Removal of Right Breast Tissue Expander with Placement of Right Breast Implant, Possible Lipsuction with Lipofilling LIPOSUCTION WITH LIPOFILLING (Right) as a surgical intervention .  The patient's history has been reviewed, patient examined, no change in status, stable for surgery.  I have reviewed the patient's chart and labs.  Questions were answered to the patient's satisfaction.     SANGER,CLAIRE

## 2013-03-14 NOTE — Transfer of Care (Signed)
Immediate Anesthesia Transfer of Care Note  Patient: Leslie Salazar  Procedure(s) Performed: Procedure(s) with comments: REMOVAL OF RIGHT BREAST TISSUE EXPANDER WITH PLACEMENT OF RIGHT BREAST IMPLANT (Right) - Removal of Right Breast Tissue Expander with Placement of Right Breast Implant, Possible Lipsuction with Lipofilling LIPOSUCTION WITH LIPOFILLING (Right)  Patient Location: PACU  Anesthesia Type:General  Level of Consciousness: sedated  Airway & Oxygen Therapy: Patient Spontanous Breathing and Patient connected to face mask oxygen  Post-op Assessment: Report given to PACU RN and Post -op Vital signs reviewed and stable  Post vital signs: Reviewed and stable  Complications: No apparent anesthesia complications

## 2013-03-14 NOTE — H&P (View-Only) (Signed)
  This document contains confidential information from a [facility] medical record system and may be unauthenticated. Release may be made only with a valid authorization or in accordance with applicable policies of Medical Center or its affiliates. This document must be maintained in a secure manner or discarded/destroyed as required by Medical Center policy or by a confidential means such as shredding.     Leslie Salazar  03/06/2013 11:00 AM   Office Visit  MRN:  [id number]   Department: [facility]  Dept Phone: [phone]   Description: Female DOB: 04/21/1965  Provider: Pete Schnitzer Sanger, DO   Diagnoses    Acquired absence of bilateral breasts and nipples    -  Primary   V45.71      Reason for Visit -  Breast Reconstruction      Vitals - Last Recorded    125/88  69  98.6 F (37 C)  1.575 m (5' 2.01")  62.596 kg (138 lb)  25.23 kg/m2      Subjective:     Patient ID: Leslie Salazar is a 47 y.o. female.  HPI The patient is a 47 yrs old wf here for a post operative follow up after right breast reconstruction with a latissimus/expander placement 11/16/2012. She was diagnosed with infiltrating ductal carcinoma of the right breast in 2012. She underwent two lumpectomies followed by bilateral mastectomies. It was T1b N0 M0, ER/PR positive, HER-2/neu positive. She had right sided radiation which finished in 08/2011. She is negative for BRCA1 and BRCA2 mutations. She then had expanders placed in 1/13 and then the implants (400 cc silicone smooth round high profile 350-4004BC). She is on tamoxifen. She is 5 feet 2 inches tall, weighs 135 and wore a 36C cup bra. She would like to be around a C and B cup size. She has darkening of the skin on the right due to the radiation. She underwent a right latissimus myocutaneous flap with an expander placement. The right expander has 390/350 cc and we will place a little more to have the extra room to try and move the flap more  medially.  The following portions of the patient's history were reviewed and updated as appropriate: allergies, current medications, past family history, past medical history, past social history, past surgical history and problem list.  Review of Systems  All other systems reviewed and are negative.     Objective:    Physical Exam  Constitutional: She appears well-developed and well-nourished.  HENT:   Head: Normocephalic and atraumatic.  Eyes: Conjunctivae are normal. Pupils are equal, round, and reactive to light.  Cardiovascular: Normal rate.   Pulmonary/Chest: Effort normal.  Musculoskeletal: Normal range of motion.  Neurological: She is alert.  Skin: Skin is warm.  Psychiatric: She has a normal mood and affect. Her behavior is normal. Judgment and thought content normal.      Assessment:   1.  Acquired absence of bilateral breasts and nipples        Plan:     We placed injectable saline in the Right: 50 cc for a total of 440/350 cc It was done under sterile conditions and the patient tolerated it well. Will will remove the expander and place a silicone implant on the right.  The left was already done by another surgeon.           

## 2013-03-14 NOTE — Op Note (Signed)
Op report Bilateral Exchange   DATE OF OPERATION: 03/14/2013  LOCATION: [facility]  SURGICAL DIVISION: Plastic Surgery  PREOPERATIVE DIAGNOSES:  1. History of breast cancer.  2. Acquired absence of right breast.   POSTOPERATIVE DIAGNOSES:  1. History of breast cancer.  2. Acquired absence of bilateral breast.   PROCEDURE:  1. Right exchange of tissue expander for implant.  2. Right capsulotomies for implant respositioning.  SURGEON: Wayland Denis, DO  ASSISTANT: Shawn Rayburn, PA  ANESTHESIA:  General.   COMPLICATIONS: None.   IMPLANTS: Right - [employer] Smooth Round High Profile Gel 350cc. Ref #[id number].  Serial Number [id number]  INDICATIONS FOR PROCEDURE:  The patient, Leslie Salazar, is a 47 y.o. female born on 06-04-1965, is here for treatment after bilateral mastectomies.  She had tissue expanders placed at the time of mastectomies. The left implant was placed by a referring surgeon.  The right side underwent radiation and had complications.  She therefore underwent a latissimus reconstruction with expander placement by our team. She now presents for exchange of her right expanders for a silicone implant.  She requires capsulotomies to better position the implant.   CONSENT:  Informed consent was obtained directly from the patient. Risks, benefits and alternatives were fully discussed. Specific risks including but not limited to bleeding, infection, hematoma, seroma, scarring, pain, implant infection, implant extrusion, capsular contracture, asymmetry, wound healing problems, and need for further surgery were all discussed. The patient did have an ample opportunity to have her questions answered to her satisfaction.   DESCRIPTION OF PROCEDURE:  The patient was taken to the operating room. SCDs were placed and IV antibiotics were given. The patient's chest was prepped and draped in a sterile fashion. A time out was performed and the implants to be used were  identified.  One percent Lidocaine with epinephrine was used to infiltrate the area.   The old mastectomy scar was opened and superior mastectomy and inferior mastectomy flap was raised for 1 - 2 cm. The latissimus muscle was split to expose the tissue expander which was removed. Inspection of the pocket showed a normal healthy capsule.   Circumferenctial capsulotomies were performed in the pocket to allow for breast pocket expansion.  Measurements were made and a sizer placed to confirm the correct size.  The 350 cc size was selected.  Hemostasis was ensured.  Gloves were changed. The implant was placed in the pockets and oriented appropriately. The pocket was irrigated with antibiotic solution. The latissimus dorsi muscle and capsule on the anterior surface were re-closed with a 3-0 running PDS suture. The paddle from the latissimus muscle was positioned more medially to help with appearance and contour.  The remaining skin was closed with 4-0 Monocryl deep dermal and 5-0 Monocryl subcuticular stitches.  A breast binder and ABDs were placed.  The patient was awakened from anesthesia and taken to the recovery room in satisfactory condition.

## 2013-03-14 NOTE — Discharge Instructions (Addendum)
Call your surgeon if you experience:   1.  Fever over 101.0. 2.  Inability to urinate. 3.  Nausea and/or vomiting. 4.  Extreme swelling or bruising at the surgical site. 5.  Continued bleeding from the incision. 6.  Increased pain, redness or drainage from the incision. 7.  Problems related to your pain medication.  Post Anesthesia Home Care Instructions  Activity: Get plenty of rest for the remainder of the day. A responsible adult should stay with you for 24 hours following the procedure.  For the next 24 hours, DO NOT: -Drive a car -Operate machinery -Drink alcoholic beverages -Take any medication unless instructed by your physician -Make any legal decisions or sign important papers.  Meals: Start with liquid foods such as gelatin or soup. Progress to regular foods as tolerated. Avoid greasy, spicy, heavy foods. If nausea and/or vomiting occur, drink only clear liquids until the nausea and/or vomiting subsides. Call your physician if vomiting continues.  Special Instructions/Symptoms: Your throat may feel dry or sore from the anesthesia or the breathing tube placed in your throat during surgery. If this causes discomfort, gargle with warm salt water. The discomfort should disappear within 24 hours.   

## 2013-03-14 NOTE — Anesthesia Procedure Notes (Signed)
Procedure Name: LMA Insertion Date/Time: 03/14/2013 8:32 AM Performed by: Burna Cash Pre-anesthesia Checklist: Patient identified, Emergency Drugs available, Suction available and Patient being monitored Patient Re-evaluated:Patient Re-evaluated prior to inductionOxygen Delivery Method: Circle System Utilized Preoxygenation: Pre-oxygenation with 100% oxygen Intubation Type: IV induction Ventilation: Mask ventilation without difficulty LMA: LMA inserted LMA Size: 4.0 Number of attempts: 1 Airway Equipment and Method: bite block Placement Confirmation: positive ETCO2 Tube secured with: Tape Dental Injury: Teeth and Oropharynx as per pre-operative assessment

## 2013-03-19 ENCOUNTER — Encounter (HOSPITAL_BASED_OUTPATIENT_CLINIC_OR_DEPARTMENT_OTHER): Payer: Self-pay | Admitting: Plastic Surgery

## 2014-02-11 ENCOUNTER — Encounter (HOSPITAL_BASED_OUTPATIENT_CLINIC_OR_DEPARTMENT_OTHER): Payer: Self-pay | Admitting: Plastic Surgery

## 2014-03-30 ENCOUNTER — Other Ambulatory Visit: Payer: Self-pay | Admitting: Gynecology

## 2014-04-09 ENCOUNTER — Telehealth: Payer: Self-pay | Admitting: *Deleted

## 2014-04-09 NOTE — Telephone Encounter (Signed)
Dr.Fernandez pt has annual scheduled on 05/03/14 asked if you would like her to have ultrasound same day per note "ultrasound today with endometrial stripe 3.9 mm and right atrophic normal ovary and left ovary with a small 18 mm follicle and no fluid in the cul-de-sac." please advise

## 2014-04-09 NOTE — Telephone Encounter (Signed)
Yes ultrasound same day. History of breast cancer

## 2014-04-09 NOTE — Telephone Encounter (Signed)
Pt called asking why valtrex Rx has been denied, I left on voicemail pt last seen in 11/2011 and annual is due.

## 2014-04-10 ENCOUNTER — Other Ambulatory Visit: Payer: Self-pay | Admitting: Gynecology

## 2014-04-10 DIAGNOSIS — Z853 Personal history of malignant neoplasm of breast: Secondary | ICD-10-CM

## 2014-04-10 NOTE — Telephone Encounter (Signed)
Order placed. I sent Leslie Salazar. A message to call the patient and to coordinate u/s with CE.

## 2014-04-10 NOTE — Telephone Encounter (Signed)
Butch Penny messaged me back and u/s is arranged to be done prior to CE same date as scheduled.

## 2014-05-02 ENCOUNTER — Telehealth: Payer: Self-pay | Admitting: *Deleted

## 2014-05-02 MED ORDER — VALACYCLOVIR HCL 500 MG PO TABS: 500.0000 mg | ORAL_TABLET | Freq: Every day | ORAL | Status: DC

## 2014-05-02 NOTE — Telephone Encounter (Signed)
Office called and pt had to reschedule her annual to 05/08/14, needs refill on valtrex, Rx will be sent.

## 2014-05-03 ENCOUNTER — Encounter: Admitting: Gynecology

## 2014-05-03 ENCOUNTER — Other Ambulatory Visit

## 2014-05-08 ENCOUNTER — Other Ambulatory Visit: Payer: Self-pay | Admitting: Gynecology

## 2014-05-08 ENCOUNTER — Ambulatory Visit (INDEPENDENT_AMBULATORY_CARE_PROVIDER_SITE_OTHER): Payer: Federal, State, Local not specified - PPO

## 2014-05-08 ENCOUNTER — Ambulatory Visit (INDEPENDENT_AMBULATORY_CARE_PROVIDER_SITE_OTHER): Payer: Federal, State, Local not specified - PPO | Admitting: Gynecology

## 2014-05-08 ENCOUNTER — Other Ambulatory Visit (HOSPITAL_COMMUNITY)
Admission: RE | Admit: 2014-05-08 | Discharge: 2014-05-08 | Disposition: A | Discharge status: Home / Self Care | Payer: Federal, State, Local not specified - PPO | Source: Ambulatory Visit | Attending: Gynecology | Admitting: Gynecology

## 2014-05-08 ENCOUNTER — Encounter: Payer: Self-pay | Admitting: Gynecology

## 2014-05-08 VITALS — BP 120/78 | Ht 62.25 in | Wt 128.0 lb

## 2014-05-08 DIAGNOSIS — Z79810 Long term (current) use of selective estrogen receptor modulators (SERMs): Secondary | ICD-10-CM

## 2014-05-08 DIAGNOSIS — N83202 Unspecified ovarian cyst, left side: Secondary | ICD-10-CM

## 2014-05-08 DIAGNOSIS — N8320 Unspecified ovarian cysts: Secondary | ICD-10-CM

## 2014-05-08 DIAGNOSIS — D252 Subserosal leiomyoma of uterus: Secondary | ICD-10-CM

## 2014-05-08 DIAGNOSIS — Z01419 Encounter for gynecological examination (general) (routine) without abnormal findings: Secondary | ICD-10-CM | POA: Diagnosis not present

## 2014-05-08 DIAGNOSIS — Z853 Personal history of malignant neoplasm of breast: Secondary | ICD-10-CM

## 2014-05-08 DIAGNOSIS — Z1151 Encounter for screening for human papillomavirus (HPV): Secondary | ICD-10-CM | POA: Diagnosis present

## 2014-05-08 DIAGNOSIS — E2839 Other primary ovarian failure: Secondary | ICD-10-CM | POA: Insufficient documentation

## 2014-05-08 DIAGNOSIS — E288 Other ovarian dysfunction: Secondary | ICD-10-CM

## 2014-05-08 MED ORDER — VALACYCLOVIR HCL 500 MG PO TABS: 500.0000 mg | ORAL_TABLET | Freq: Every day | ORAL | Status: DC

## 2014-05-08 NOTE — Addendum Note (Signed)
Addended by: Thurnell Garbe A on: 05/08/2014 12:11 PM   Modules accepted: Orders, SmartSet

## 2014-05-08 NOTE — Patient Instructions (Signed)

## 2014-05-08 NOTE — Progress Notes (Addendum)
Leslie Salazar [facility] 1965-11-24 [id number]   History:    49 y.o.  for annual gyn exam with no complaints today. 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 She was tested for the BRCA1 and BRCA2 N. no mutations were detected. She completed radiation back last year and has been on tamoxifen and reports no vaginal bleeding.She's been followed by the oncologist Dr. Verdell Carmine in [place]. In 1994 patient had CO2 laser of cervical dysplasia at another practice. Ever since then her Pap smears have been normal. 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 Patient recently had breast reconstruction. Patient's primary doctor Bryon Lions has been doing her blood work.  Patient informed me that last year from a simple standing position she broke one of her metatarsals on her left foot. Patient with no family history of osteoporosis.  Ultrasound today because of patient's history of being on tamoxifen. Patient has history of endometrial ablation. Ultrasound today reported the following: Uterus measured 8.9 x 5.1 x 3.8 cm with endometrial stripe of 3.3 mm. Posterior fundal right fibroid measuring 11 x 10 mm millimeters was noted. Thin endometrial cavity. Right ovary normal. Left ovary thinwall echo-free simple avascular cyst average size 2.2 cm. Negative fluid in the cul-de-sac.  Past medical history,surgical history, family history and social history were all reviewed and documented in the EPIC chart.  Gynecologic History No LMP recorded. Patient has had an ablation. Contraception: none Last Pap: 2012. Results were: normal Last mammogram: 2012. Results were: See above  Obstetric History OB History  Gravida Para Term Preterm AB SAB TAB Ectopic Multiple  Living  _0 # Outcome Date GA Lbr Len/2nd Weight Sex Delivery Anes PTL Lv  1 Term     M CS-Unspec  N Y       ROS: A ROS was performed and pertinent positives and negatives are included in the history.  GENERAL: No fevers or chills. HEENT: No change in vision, no earache, sore throat or sinus congestion. NECK: No pain or stiffness. CARDIOVASCULAR: No chest pain or pressure. No palpitations. PULMONARY: No shortness of breath, cough or wheeze. GASTROINTESTINAL: No abdominal pain, nausea, vomiting or diarrhea, melena or bright red blood per rectum. GENITOURINARY: No urinary frequency, urgency, hesitancy or dysuria. MUSCULOSKELETAL: No joint or muscle pain, no back pain, no recent trauma. DERMATOLOGIC: No rash, no itching, no lesions. ENDOCRINE: No polyuria, polydipsia, no heat or cold intolerance. No recent change in weight. HEMATOLOGICAL: No anemia or easy bruising or bleeding. NEUROLOGIC: No headache, seizures, numbness, tingling or weakness. PSYCHIATRIC: No depression, no loss of interest in normal activity or change in sleep pattern.     Exam: chaperone present  BP 120/78 mmHg  Ht 5' 2.25" (1.581 m)  Wt 128 lb (58.06 kg)  BMI 23.23 kg/m2  Body mass index is 23.23 kg/(m^2).  General appearance : Well developed well nourished female. No acute distress HEENT: Neck supple, trachea midline, no carotid bruits, no thyroidmegaly Lungs: Clear to auscultation, no rhonchi or wheezes, or rib retractions  Heart: Regular rate and rhythm, no murmurs or gallops Breast:Examined in sitting and supine position were symmetrical in appearance, no palpable masses or tenderness,  no skin retraction, no nipple inversion, no nipple discharge, no skin discoloration,  no axillary or supraclavicular lymphadenopathy Abdomen: no palpable masses or tenderness, no rebound or guarding Extremities: no edema or skin discoloration or tenderness  Pelvic:  Bartholin, Urethra, Skene Glands: Within normal  limits             Vagina: No gross lesions or discharge  Cervix: No gross lesions or discharge  Uterus  anteverted, normal size, shape and consistency, non-tender and mobile  Adnexa  Without masses or tenderness  Anus and perineum  normal   Rectovaginal  normal sphincter tone without palpated masses or tenderness             Hemoccult not indicated     Assessment/Plan:  49 y.o. 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 She was tested for the BRCA1 and BRCA2 N. no mutations were detected who is doing well currently on tamoxifen. Ultrasound demonstrating thin endometrium. Small right ovarian follicle. Follow-up ultrasound 6 months. Patient reports no vaginal bleeding. PCP has been doing her blood work and recently started her on statin because of hypercholesterolemia. Patient to follow-up with her medical oncologist. Pap smear done today. Flu vaccine up-to-date. Patient will schedule a bone density study here in the office in the next few weeks.   Terrance Mass MD, 11:36 AM 05/08/2014

## 2014-05-13 LAB — CYTOLOGY - PAP

## 2014-05-21 ENCOUNTER — Ambulatory Visit (INDEPENDENT_AMBULATORY_CARE_PROVIDER_SITE_OTHER): Payer: Federal, State, Local not specified - PPO

## 2014-05-21 ENCOUNTER — Other Ambulatory Visit: Payer: Self-pay | Admitting: Gynecology

## 2014-05-21 ENCOUNTER — Telehealth: Payer: Self-pay | Admitting: *Deleted

## 2014-05-21 DIAGNOSIS — E288 Other ovarian dysfunction: Secondary | ICD-10-CM

## 2014-05-21 DIAGNOSIS — E2839 Other primary ovarian failure: Secondary | ICD-10-CM

## 2014-05-21 DIAGNOSIS — Z13820 Encounter for screening for osteoporosis: Secondary | ICD-10-CM

## 2014-05-21 DIAGNOSIS — Z853 Personal history of malignant neoplasm of breast: Secondary | ICD-10-CM

## 2014-05-21 MED ORDER — VALACYCLOVIR HCL 500 MG PO TABS: 500.0000 mg | ORAL_TABLET | Freq: Every day | ORAL | Status: DC

## 2014-05-21 NOTE — Telephone Encounter (Signed)
Rx sent to [facility]

## 2014-05-21 NOTE — Telephone Encounter (Signed)
-----   Message from Sinclair Grooms sent at 05/21/2014  8:52 AM EST ----- Regarding: RX RX was sent to wrong pharmacy. Please send  valACYclovir (VALTREX) 500 MG tablet [[id number]] to [facility].  Thx

## 2014-09-20 ENCOUNTER — Other Ambulatory Visit: Payer: Self-pay | Admitting: Gynecology

## 2014-09-20 DIAGNOSIS — N8300 Follicular cyst of ovary, unspecified side: Secondary | ICD-10-CM

## 2014-10-23 ENCOUNTER — Ambulatory Visit (INDEPENDENT_AMBULATORY_CARE_PROVIDER_SITE_OTHER): Payer: Federal, State, Local not specified - PPO | Admitting: Gynecology

## 2014-10-23 ENCOUNTER — Encounter: Payer: Self-pay | Admitting: Gynecology

## 2014-10-23 ENCOUNTER — Ambulatory Visit (INDEPENDENT_AMBULATORY_CARE_PROVIDER_SITE_OTHER): Payer: Federal, State, Local not specified - PPO

## 2014-10-23 VITALS — BP 124/76

## 2014-10-23 DIAGNOSIS — N952 Postmenopausal atrophic vaginitis: Secondary | ICD-10-CM

## 2014-10-23 DIAGNOSIS — N8300 Follicular cyst of ovary, unspecified side: Secondary | ICD-10-CM

## 2014-10-23 DIAGNOSIS — N83202 Unspecified ovarian cyst, left side: Secondary | ICD-10-CM

## 2014-10-23 DIAGNOSIS — Z853 Personal history of malignant neoplasm of breast: Secondary | ICD-10-CM | POA: Diagnosis not present

## 2014-10-23 DIAGNOSIS — N8320 Unspecified ovarian cysts: Secondary | ICD-10-CM

## 2014-10-23 NOTE — Patient Instructions (Signed)
Estradiol vaginal tablets What is this medicine? ESTRADIOL (es tra DYE ole) vaginal tablet is used to help relieve symptoms of vaginal irritation and dryness that occurs in some women during menopause. This medicine may be used for other purposes; ask your health care provider or pharmacist if you have questions. COMMON BRAND NAME(S): Vagifem What should I tell my health care provider before I take this medicine? They need to know if you have any of these conditions: -abnormal vaginal bleeding -blood vessel disease or blood clots -breast, cervical, endometrial, ovarian, liver, or uterine cancer -dementia -diabetes -gallbladder disease -heart disease or recent heart attack -high blood pressure -high cholesterol -high level of calcium in the blood -hysterectomy -kidney disease -liver disease -migraine headaches -protein C deficiency -protein S deficiency -stroke -systemic lupus erythematosus (SLE) -tobacco smoker -an unusual or allergic reaction to estrogens, other hormones, medicines, foods, dyes, or preservatives -pregnant or trying to get pregnant -breast-feeding How should I use this medicine? This medicine is only for use in the vagina. Do not take by mouth. Wash your hands before and after use. Read package directions carefully. Unwrap the pre-filled applicator package. Lie on your back, part and bend your knees. Gently insert the applicator tip high in the vagina and push the plunger to release the tablet into the vagina. Gently remove the applicator. Throw away the applicator after use. Do not use your medicine more often than directed. Finish the full course prescribed by your doctor or health care professional even if you think your condition is better. Do not stop using except on the advice of your doctor or health care professional. Talk to your pediatrician regarding the use of this medicine in children. A patient package insert for the product will be given with each  prescription and refill. Read this sheet carefully each time. The sheet may change frequently. Overdosage: If you think you have taken too much of this medicine contact a poison control center or emergency room at once. NOTE: This medicine is only for you. Do not share this medicine with others. What if I miss a dose? If you miss a dose, take it as soon as you can. If it is almost time for your next dose, take only that dose. Do not take double or extra doses. What may interact with this medicine? Do not take this medicine with any of the following medications: -aromatase inhibitors like aminoglutethimide, anastrozole, exemestane, letrozole, testolactone This medicine may also interact with the following medications: -antibiotics used to treat tuberculosis like rifabutin, rifampin and rifapentene -raloxifene or tamoxifen -warfarin This list may not describe all possible interactions. Give your health care provider a list of all the medicines, herbs, non-prescription drugs, or dietary supplements you use. Also tell them if you smoke, drink alcohol, or use illegal drugs. Some items may interact with your medicine. What should I watch for while using this medicine? Visit your health care professional for regular checks on your progress. You will need a regular breast and pelvic exam. You should also discuss the need for regular mammograms with your health care professional, and follow his or her guidelines. This medicine can make your body retain fluid, making your fingers, hands, or ankles swell. Your blood pressure can go up. Contact your doctor or health care professional if you feel you are retaining fluid. If you have any reason to think you are pregnant; stop taking this medicine at once and contact your doctor or health care professional. Tobacco smoking increases the risk of getting  a blood clot or having a stroke, especially if you are more than 48 years old. You are strongly advised not to  smoke. If you wear contact lenses and notice visual changes, or if the lenses begin to feel uncomfortable, consult your eye care specialist. If you are going to have elective surgery, you may need to stop taking this medicine beforehand. Consult your health care professional for advice prior to scheduling the surgery. What side effects may I notice from receiving this medicine? Side effects that you should report to your doctor or health care professional as soon as possible: -allergic reactions like skin rash, itching or hives, swelling of the face, lips, or tongue -breast tissue changes or discharge -changes in vision -chest pain -confusion, trouble speaking or understanding -dark urine -general ill feeling or flu-like symptoms -light-colored stools -nausea, vomiting -pain, swelling, warmth in the leg -right upper belly pain -severe headaches -shortness of breath -sudden numbness or weakness of the face, arm or leg -trouble walking, dizziness, loss of balance or coordination -unusual vaginal bleeding -yellowing of the eyes or skin Side effects that usually do not require medical attention (report to your doctor or health care professional if they continue or are bothersome): -hair loss -increased hunger or thirst -increased urination -symptoms of vaginal infection like itching, irritation or unusual discharge -unusually weak or tired This list may not describe all possible side effects. Call your doctor for medical advice about side effects. You may report side effects to FDA at 1-800-FDA-1088. Where should I keep my medicine? Keep out of the reach of children. Store at room temperature between 15 and 30 degrees C (59 and 86 degrees F). Throw away any unused medicine after the expiration date. NOTE: This sheet is a summary. It may not cover all possible information. If you have questions about this medicine, talk to your doctor, pharmacist, or health care provider.  2015,  Elsevier/Gold Standard. (2010-07-01 09:08:58)

## 2014-10-23 NOTE — Progress Notes (Signed)
   Patient presented to the office today for follow-up ultrasound. Patient's history is as follows:  Patient several years ago was diagnosed with premature ovarian failure. 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 She was tested for the BRCA1 and BRCA2 N. no mutations were detected. She completed radiation back last year and has been on tamoxifen and reports no vaginal bleeding.  Ultrasound January 2016 had demonstrated the following: Uterus measured 8.9 x 5.1 x 3.8 cm with endometrial stripe of 3.3 mm. Posterior fundal right fibroid measuring 11 x 10 mm millimeters was noted. Thin endometrial cavity. Right ovary normal. Left ovary thinwall echo-free simple avascular cyst average size 2.2 cm. Negative fluid in the cul-de-sac.  Patient is suffering from severe vaginal dryness and irritation she is using vitamin E suppository with some improvement. She denies any vaginal bleeding.  Today's ultrasound: Uterus measured 8.4 x 5.3 x 3.2 cm with endometrial stripe of 2.7 mm. Several small fibroids unchanged from previous scan. Right ovary normal. Left ovary thinwall echo-free cyst average size 2.3 cm no change in size dating back to 2012. Arterial blood flow seen in the ovary but cyst was avascular and there was no fluid in the cul-de-sac.  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 We discussed the findings noted the recommendation at the present time will follow-up with ultrasound yearly. She will discuss with her medical oncologist this week about the  possibility of her being allowed to use Vagifem 10 g intravaginally twice a week for severe vaginal atrophy and vaginal pain. We will await his recommendation. Otherwise patient will return back in one year for annual exam or when necessary.

## 2014-10-25 ENCOUNTER — Other Ambulatory Visit: Payer: Self-pay | Admitting: Gynecology

## 2014-10-25 ENCOUNTER — Telehealth: Payer: Self-pay

## 2014-10-25 MED ORDER — ESTRADIOL 10 MCG VA TABS: 1.0000 | ORAL_TABLET | VAGINAL | Status: DC

## 2014-10-25 NOTE — Telephone Encounter (Signed)
Patient said you told her to check with her oncologist if Vagifem would be okay. She said she talked with her today and onc said it would be fine. Onc would prefer you to prescribe and follow. Rx?

## 2014-10-25 NOTE — Telephone Encounter (Signed)
Please call in Vagifem 10 g and tell her that she can apply intravaginally twice a week. Asked her if she can ask her oncologist to see me in notes that we can put him on her records that it is okay for her to be able to use. Please give her 1 month supply with 11 refills

## 2014-10-25 NOTE — Telephone Encounter (Signed)
Patient informed Rx sent. Asked her to have oncologist send a note to Dr. Lodema Pilot Vagifem.

## 2015-01-07 ENCOUNTER — Other Ambulatory Visit: Payer: Self-pay | Admitting: Plastic Surgery

## 2015-01-07 DIAGNOSIS — Z853 Personal history of malignant neoplasm of breast: Secondary | ICD-10-CM

## 2015-01-07 DIAGNOSIS — C50911 Malignant neoplasm of unspecified site of right female breast: Secondary | ICD-10-CM

## 2015-01-07 DIAGNOSIS — N651 Disproportion of reconstructed breast: Secondary | ICD-10-CM

## 2015-01-11 ENCOUNTER — Ambulatory Visit
Admission: RE | Admit: 2015-01-11 | Discharge: 2015-01-11 | Disposition: A | Discharge status: Home / Self Care | Payer: Federal, State, Local not specified - PPO | Source: Ambulatory Visit | Attending: Plastic Surgery | Admitting: Plastic Surgery

## 2015-01-11 DIAGNOSIS — C50911 Malignant neoplasm of unspecified site of right female breast: Secondary | ICD-10-CM

## 2015-01-11 DIAGNOSIS — Z853 Personal history of malignant neoplasm of breast: Secondary | ICD-10-CM

## 2015-01-11 DIAGNOSIS — N651 Disproportion of reconstructed breast: Secondary | ICD-10-CM

## 2015-02-23 DIAGNOSIS — I209 Angina pectoris, unspecified: Secondary | ICD-10-CM

## 2015-02-23 NOTE — H&P (Signed)
OFFICE VISIT NOTES COPIED TO EPIC FOR DOCUMENTATION  Leslie Salazar 02-05-15 2:19 PM Location: [facility] Patient #: [id number] DOB: 08/12/1965 Married / Language: English / Race: Black or African American Female   History of Present Illness Leslie Salazar K. Vyas MD; February 05, 2015 3:44 PM) Patient words: f/u cp per pt on exertion; Pt states she started going to gym with trainer, she noticed for a few weeks she felt winded. She started doing workout with kettle bells, and began to have very bad chest pain. She wants evaluation for this.  The patient is a 49 year old female who presents with chest pain. Leslie Salazar is 49 year old African-American female. She is complaining of chest pain off and on for past one month. It occurs when she is doing weight training exercises. The pain is felt in the substernal region, as heaviness. No radiation to the arms, neck or back of the chest. It is associated with shortness of breath and once she had nausea with the chest pain. No history of diaphoresis at any time. The pain subsides spontaneously in a few minutes.  Patient denies shortness of breath at other times. No orthopnea or PND. No history of palpitation, sudden heart racing or irregular heartbeat. No dizziness, near syncope or syncope. No history of swelling on the legs or claudication.  She has hypertension. No history of diabetes. She has h/o high cholesterol, well controlled with therapy. She does not smoke.  No history of thyroid problems. No history of TIA or CVA. She has had bilateral mastectomy for carcinoma of breast on Rt. side and had reconstruction surgeries. She had radiation therapy on the right side. No history of chemotherapy.     Problem List/Past Medical Leslie Salazar; 2015/02/05 2:21 PM) Benign essential hypertension (I10) Abnormal EKG (R94.31) Hypercholesteremia (E78.00) Chest pain (R07.9) History of breast cancer (Z85.3) Calcium  deficiency (E58)  Allergies Leslie Salazar; 02-05-15 2:21 PM) No Known Drug Allergies08/06/2013  Family History Leslie Salazar; 02-05-15 2:25 PM) Mother Deceased. at age 39, from Kidney Failure. Hx of Stroke in early 40's, HTN, Diabtes Father In good health. Known HTN Siblings Sister 1- 1 year younger Sister 2- 3 years younger  Social History Leslie Salazar; 05-Feb-2015 2:21 PM) Current tobacco use Never smoker. Alcohol Use Occasional alcohol use. Marital status Married. Number of Children 1. Living Situation Lives with spouse.  Past Surgical History Leslie Salazar; 02/05/2015 2:21 PM) Cesarean Delivery03/2000 Tubal Ligation2003 Endometrium Ablation2008 Lumpectomy11/2012 Right. Mastectomy - Both01/2013 Right breast - Cancer Reconstructive surgery for Breast 04/2011 Bilateral. Aug 2013, implants were put in Latissimus Flap Surgery (Free Text) 11/2012  Medication History Leslie Salazar; 02/05/15 2:26 PM) Nitroglycerin (0.4MG Tab Sublingual, 1 (one) Tablet Sublingual every 5 minutes as needed for chest pain., Taken starting 01/29/2015) Active. Metoprolol Succinate ER (25MG Tablet ER 24HR, 1 (one) Tablet ER 24HR Tablet Oral daily, Taken starting 04/24/2014) Active. Atorvastatin Calcium (20MG Tablet, 1 (one) Tablet Tablet Oral daily, Taken starting 04/24/2014) Active. Hydrochlorothiazide (25MG Tablet, 1 Oral daily) Active. Tamoxifen Citrate (20MG Tablet, 1 Oral daily) Active. ValACYclovir HCl (500MG Tablet, 1 Oral daily) Active. Vitamin D (1000UNIT Tablet, 1 Oral daily) Active. Citracal 1259m Calcium 1000IU (Free Text) (2 daily) Active. Medications Reconciled CoQ10 (100MG Capsule, 1 Oral daily) Active. Vitamin C (500MG Tablet, 1 Oral daily) Active. Vitamin E (1 Oral daily) Specific dose unknown - Active.  Diagnostic Studies History (Leslie BrookBeane; 110/26/20162:21 PM) Nuclear stress test Exercise sestamibi stress test 11/19/2013: 1. Resting  EKG demonstrated normal sinus rhythm. Stress  ECG was at least equivocal for ischemia with 1.5 mm horizontal ST depressions in the inferior and lateral lead, cannot exclude ischemia. ST segment changes returned to baseline at 2 minutes into recovery. Stress test terminated due to target heart rate being met. The patient completed an estimated workload of 7.8 METS. 2. Myocardial perfusion imaging is normal. Overall left ventricular systolic function was normal without regional wall motion abnormalities. The left ventricular ejection fraction was 78%. Echocardiogram Echo- 11/20/13 1. Left ventricle cavity is normal in size. Normal global wall motion. Normal systolic function, calculated EF is 69%. 2. Tricuspid aortic valve with no regurgitation noted. Mild aortic valve leaflet thickening. 3. Mild mitral regurgitation. Mild mitral valve leaflet thickening. 4. Mild tricuspid regurgitation. No evidence of pulmonary hypertension. Endoscopy2008 Colonoscopy2014 Normal Treadmill stress test2014 By Dr Baird [facility]    Review of Systems Leslie Salazar K. Vyas MD; 02/04/2015 3:53 PM)  Note: GENERAL- Not feeling tired or fatigue, No fever, chills. No recent weight change. CARDIO VASCULAR- Has chest pain, Has exertional shortness of breath with chest pain, No orthopnea or PND. No palpitation, dizziness, fainting. Has hypertension. Has h/o high cholesterol. No swelling on legs. No claudication in legs, No cramps. No h/o DVT PULMONARY- No cough, phlegm, wheezing, not feeling congested in chest. GASTROINTESTINAL- No abdominal pain, nausea, vomiting or diarrhea. No dark tarry stools.Normal appetite. No heartburn. ENDOCRINE- No Thyroid problem, No feeling of excessive heat or cold, No polydipsia or polyuria. No Diabetes. NEUROLOGICAL- No focal motor or sensory symptoms, Good coordination. No seizures. MUSCULOSKELETAL- No generalized myalgias or muscle weakness. No joint swelling SKIN- No skin rash, No  pruritus HEMATOLOGY- No anemia, petechiae, excessive bruising, epistaxis, GI bleed or any abnormal bleeding.   Vitals Leslie Salazar; 02/04/2015 2:29 PM) 02/04/2015 2:21 PM Weight: 146.31 lb Height: 62in Body Surface Area: 1.67 m Body Mass Index: 26.76 kg/m  Pulse: 72 (Regular)  P.OX: 97% (Room air) BP: 118/78 (Sitting, Left Arm, Standard)       Physical Exam Leslie Salazar K. Vyas MD; 02/04/2015 3:54 PM) The physical exam findings are as follows: Note:GENERAL APPEARANCE- Alert, Oriented. Well built, nourished HEENT- Unremarkable, fundi were not examined. Pt. wears glasses. NECK- No JVD. Carotid pulses are 2+, No bruits audible. No thyromegaly. No lymphadenopathy. HEART- Auscultation- Normal S1, S2. No gallops or murmurs audible. CHEST- Normal percussion. Auscultation- Normal breath sounds, No crepitations. No wheezing. ABDOMEN- Palpation- Soft, Nontender. No hepatosplenomegaly. No masses felt. Auscultation- Normal bowel sounds. No bruits audible. EXTREMITIES- No Clubbing or Cyanosis. No edema on legs or feet. PERIPHERAL PULSES- Both femoral pulses- 2+, No bruits audible. Both dorsalis pedis pulses- 2+, Both posterior tibial pulses- 2+    Assessment & Plan Leslie Salazar K. Vyas MD; 02/04/2015 3:55 PM) Chest pain, exertional (R07.9) Story: Exercise sestamibi stress test 11/19/2013: 1. Resting EKG demonstrated normal sinus rhythm. Stress ECG was at least equivocal for ischemia with 1.5 mm horizontal ST depressions in the inferior and lateral lead, cannot exclude ischemia. ST segment changes returned to baseline at 2 minutes into recovery. Stress test terminated due to target heart rate being met. The patient completed an estimated workload of 7.8 METS. 2. Myocardial perfusion imaging is normal. Overall left ventricular systolic function was normal without regional wall motion abnormalities. The left ventricular ejection fraction was 78%. Current Plans Started Aspirin EC Low  Dose 81MG, 1 (one) Tablet daily, #30, 02/04/2015, No Refill. Complete electrocardiogram (93000) Future Plans 84/04/6604: METABOLIC PANEL, BASIC (30160) - one time 02/19/2015: CBC & PLATELETS (AUTO) (10932) - one time 02/19/2015: PT (PROTHROMBIN  TIME) (94712) - one time Benign essential hypertension (I10) Story: Echocardiogram. Echo- 11/20/13 1. Left ventricle cavity is normal in size. Normal global wall motion. Normal systolic function, calculated EF is 69%. 2. Tricuspid aortic valve with no regurgitation noted. Mild aortic valve leaflet thickening. 3. Mild mitral regurgitation. Mild mitral valve leaflet thickening. 4. Mild tricuspid regurgitation. No evidence of pulmonary hypertension. Hypercholesteremia (E78.00) BMI 26.0-26.9,adult (Z68.26)  Note:EKG revealed sinus rhythm, possible old anteroseptal MI, nonspecific ST-T abnormality.  I have discussed my assessment with the patient. She has exertional chest pain suggestive of angina. Patient was seen by me last year for dyspnea. We had done stress nuclear scans at that time. Her stress test EKGs were abnormal, there was 1.5 mm horizontal ST depression in the inferolateral leads. Nuclear scans were normal. She was treated with medical therapy with beta blocker, statin and aspirin and she had remained stable. Now, she has history of recurrent chest pain on exertion. She had abnormal stress test with ischemic changes on the EKG as mentioned above. I have explained her the options of doing CTA or cardiac catheterization. Patient agrees to have the cardiac catheter for evaluation of coronary anatomy.  We discussed regarding risks, benefits, alternatives to this including CTA and continued medical therapy. Patient wants to proceed. Understands <1-2% risk of death, stroke, MI, urgent CABG, bleeding, infection, renal failure but not limited to these. Pt. verbalized understanding, has been scheduled for cardiac catheterization, and possible  angioplasty/stent implant by Dr. Einar Gip.  I have advised her to continue nitroglycerin prn, continue metoprolol and atorvastatin. Patient had stopped aspirin because of bruising. She did not have any other bleeding. I have advised her to restart enteric-coated aspirin 81 mg daily.  Primary prevention was again discussed. She was advised to follow low-salt low-cholesterol diet. I have advised her to avoid heavy exercise until the cardiac catheterization. It was a 30 minutes face-to-face visit for [facility], explaining the condition, test results, explaining about therapy and procedure etc.  I will see her in follow-up after the cardiac cath.  CC: Dr. Glendale Chard.  Signed electronically by Despina Hick, MD (02/04/2015 3:55 PM)

## 2015-02-28 ENCOUNTER — Encounter (HOSPITAL_COMMUNITY)
Admission: RE | Disposition: A | Discharge status: Home / Self Care | Payer: Self-pay | Source: Ambulatory Visit | Attending: Cardiology

## 2015-02-28 ENCOUNTER — Encounter (HOSPITAL_COMMUNITY): Payer: Self-pay | Admitting: Cardiology

## 2015-02-28 ENCOUNTER — Ambulatory Visit (HOSPITAL_COMMUNITY)
Admission: RE | Admit: 2015-02-28 | Discharge: 2015-02-28 | Disposition: A | Discharge status: Home / Self Care | Payer: Federal, State, Local not specified - PPO | Source: Ambulatory Visit | Attending: Cardiology | Admitting: Cardiology

## 2015-02-28 DIAGNOSIS — E58 Dietary calcium deficiency: Secondary | ICD-10-CM | POA: Insufficient documentation

## 2015-02-28 DIAGNOSIS — E785 Hyperlipidemia, unspecified: Secondary | ICD-10-CM | POA: Diagnosis not present

## 2015-02-28 DIAGNOSIS — Z923 Personal history of irradiation: Secondary | ICD-10-CM | POA: Diagnosis not present

## 2015-02-28 DIAGNOSIS — Z853 Personal history of malignant neoplasm of breast: Secondary | ICD-10-CM | POA: Diagnosis not present

## 2015-02-28 DIAGNOSIS — R072 Precordial pain: Secondary | ICD-10-CM | POA: Diagnosis present

## 2015-02-28 DIAGNOSIS — E7800 Pure hypercholesterolemia, unspecified: Secondary | ICD-10-CM | POA: Diagnosis not present

## 2015-02-28 DIAGNOSIS — I209 Angina pectoris, unspecified: Secondary | ICD-10-CM

## 2015-02-28 DIAGNOSIS — I10 Essential (primary) hypertension: Secondary | ICD-10-CM | POA: Insufficient documentation

## 2015-02-28 HISTORY — PX: CARDIAC CATHETERIZATION: SHX172

## 2015-02-28 SURGERY — LEFT HEART CATH AND CORONARY ANGIOGRAPHY
Anesthesia: LOCAL

## 2015-02-28 MED ORDER — SODIUM CHLORIDE 0.9 % IJ SOLN: 3.0000 mL | INTRAMUSCULAR | Status: DC | PRN

## 2015-02-28 MED ORDER — HEPARIN SODIUM (PORCINE) 1000 UNIT/ML IJ SOLN
INTRAMUSCULAR | Status: DC | PRN
  Administered 2015-02-28: 6000 [IU] via INTRAVENOUS

## 2015-02-28 MED ORDER — HYDROMORPHONE HCL 1 MG/ML IJ SOLN
INTRAMUSCULAR | Status: DC | PRN
  Administered 2015-02-28: 0.5 mg via INTRAVENOUS

## 2015-02-28 MED ORDER — SODIUM CHLORIDE 0.9 % WEIGHT BASED INFUSION
1.0000 mL/kg/h | INTRAVENOUS | Status: DC
Start: 2015-03-01 — End: 2015-02-28
  Administered 2015-02-28: 1 mL/kg/h via INTRAVENOUS

## 2015-02-28 MED ORDER — VERAPAMIL HCL 2.5 MG/ML IV SOLN
INTRAVENOUS | Status: AC
  Filled 2015-02-28: qty 2

## 2015-02-28 MED ORDER — SODIUM CHLORIDE 0.9 % WEIGHT BASED INFUSION: 3.0000 mL/kg/h | INTRAVENOUS | Status: AC

## 2015-02-28 MED ORDER — ASPIRIN 81 MG PO CHEW
81.0000 mg | CHEWABLE_TABLET | ORAL | Status: AC
  Administered 2015-02-28: 81 mg via ORAL

## 2015-02-28 MED ORDER — MIDAZOLAM HCL 2 MG/2ML IJ SOLN
INTRAMUSCULAR | Status: AC
  Filled 2015-02-28: qty 2

## 2015-02-28 MED ORDER — IOHEXOL 350 MG/ML SOLN
INTRAVENOUS | Status: DC | PRN
  Administered 2015-02-28: 55 mL via INTRA_ARTERIAL

## 2015-02-28 MED ORDER — SODIUM CHLORIDE 0.9 % IV SOLN: 250.0000 mL | INTRAVENOUS | Status: DC | PRN

## 2015-02-28 MED ORDER — HYDROMORPHONE HCL 1 MG/ML IJ SOLN
INTRAMUSCULAR | Status: AC
  Filled 2015-02-28: qty 1

## 2015-02-28 MED ORDER — HEPARIN SODIUM (PORCINE) 1000 UNIT/ML IJ SOLN
INTRAMUSCULAR | Status: AC
  Filled 2015-02-28: qty 1

## 2015-02-28 MED ORDER — HEPARIN (PORCINE) IN NACL 2-0.9 UNIT/ML-% IJ SOLN
INTRAMUSCULAR | Status: AC
  Filled 2015-02-28: qty 1000

## 2015-02-28 MED ORDER — SODIUM CHLORIDE 0.9 % IJ SOLN: 3.0000 mL | Freq: Two times a day (BID) | INTRAMUSCULAR | Status: DC

## 2015-02-28 MED ORDER — MIDAZOLAM HCL 2 MG/2ML IJ SOLN
INTRAMUSCULAR | Status: DC | PRN
  Administered 2015-02-28: 2 mg via INTRAVENOUS

## 2015-02-28 MED ORDER — LIDOCAINE HCL (PF) 1 % IJ SOLN
INTRAMUSCULAR | Status: AC
  Filled 2015-02-28: qty 30

## 2015-02-28 MED ORDER — SODIUM CHLORIDE 0.9 % WEIGHT BASED INFUSION
3.0000 mL/kg/h | INTRAVENOUS | Status: DC
  Administered 2015-02-28: 3 mL/kg/h via INTRAVENOUS

## 2015-02-28 MED ORDER — NITROGLYCERIN 1 MG/10 ML FOR IR/CATH LAB
INTRA_ARTERIAL | Status: DC | PRN
  Administered 2015-02-28: 08:00:00

## 2015-02-28 MED ORDER — VERAPAMIL HCL 2.5 MG/ML IV SOLN
INTRA_ARTERIAL | Status: DC | PRN
  Administered 2015-02-28: 08:00:00 via INTRA_ARTERIAL

## 2015-02-28 MED ORDER — ASPIRIN 81 MG PO CHEW
CHEWABLE_TABLET | ORAL | Status: AC
  Filled 2015-02-28: qty 1

## 2015-02-28 SURGICAL SUPPLY — 8 items
CATH OPTITORQUE TIG 4.0 5F (CATHETERS) ×2 IMPLANT
DEVICE RAD COMP TR BAND LRG (VASCULAR PRODUCTS) ×2 IMPLANT
GLIDESHEATH SLEND A-KIT 6F 20G (SHEATH) ×2 IMPLANT
KIT HEART LEFT (KITS) ×2 IMPLANT
PACK CARDIAC CATHETERIZATION (CUSTOM PROCEDURE TRAY) ×2 IMPLANT
TRANSDUCER W/STOPCOCK (MISCELLANEOUS) ×2 IMPLANT
TUBING CIL FLEX 10 FLL-RA (TUBING) ×2 IMPLANT
WIRE SAFE-T 1.5MM-J .035X260CM (WIRE) ×2 IMPLANT

## 2015-02-28 NOTE — Progress Notes (Signed)
Attempted to remove 3cc air from TRB/ pt bled instantly. Air placed back in [employer]. Will wait 30 min to try again

## 2015-02-28 NOTE — Discharge Instructions (Signed)
Radial Site Care °Refer to this sheet in the next few weeks. These instructions provide you with information about caring for yourself after your procedure. Your health care provider may also give you more specific instructions. Your treatment has been planned according to current medical practices, but problems sometimes occur. Call your health care provider if you have any problems or questions after your procedure. °WHAT TO EXPECT AFTER THE PROCEDURE °After your procedure, it is typical to have the following: °· Bruising at the radial site that usually fades within 1-2 weeks. °· Blood collecting in the tissue (hematoma) that may be painful to the touch. It should usually decrease in size and tenderness within 1-2 weeks. °HOME CARE INSTRUCTIONS °· Take medicines only as directed by your health care provider. °· You may shower 24-48 hours after the procedure or as directed by your health care provider. Remove the bandage (dressing) and gently wash the site with plain soap and water. Pat the area dry with a clean towel. Do not rub the site, because this may cause bleeding. °· Do not take baths, swim, or use a hot tub until your health care provider approves. °· Check your insertion site every day for redness, swelling, or drainage. °· Do not apply powder or lotion to the site. °· Do not flex or bend the affected arm for 24 hours or as directed by your health care provider. °· Do not push or pull heavy objects with the affected arm for 24 hours or as directed by your health care provider. °· Do not lift over 10 lb (4.5 kg) for 5 days after your procedure or as directed by your health care provider. °· Ask your health care provider when it is okay to: °¨ Return to work or school. °¨ Resume usual physical activities or sports. °¨ Resume sexual activity. °· Do not drive home if you are discharged the same day as the procedure. Have someone else drive you. °· You may drive 24 hours after the procedure unless otherwise  instructed by your health care provider. °· Do not operate machinery or power tools for 24 hours after the procedure. °· If your procedure was done as an outpatient procedure, which means that you went home the same day as your procedure, a responsible adult should be with you for the first 24 hours after you arrive home. °· Keep all follow-up visits as directed by your health care provider. This is important. °SEEK MEDICAL CARE IF: °· You have a fever. °· You have chills. °· You have increased bleeding from the radial site. Hold pressure on the site. °SEEK IMMEDIATE MEDICAL CARE IF: °· You have unusual pain at the radial site. °· You have redness, warmth, or swelling at the radial site. °· You have drainage (other than a small amount of blood on the dressing) from the radial site. °· The radial site is bleeding, and the bleeding does not stop after 30 minutes of holding steady pressure on the site. °· Your arm or hand becomes pale, cool, tingly, or numb. °  °This information is not intended to replace advice given to you by your health care provider. Make sure you discuss any questions you have with your health care provider. °  °Document Released: 05/01/2010 Document Revised: 04/19/2014 Document Reviewed: 10/15/2013 °Elsevier Interactive Patient Education ©2016 [employer] ° °

## 2015-02-28 NOTE — Interval H&P Note (Signed)
History and Physical Interval Note:  02/28/2015 6:35 AM  Leslie Salazar  has presented today for surgery, with the diagnosis of Chest Pain  The various methods of treatment have been discussed with the patient and family. After consideration of risks, benefits and other options for treatment, the patient has consented to  Procedure(s): Left Heart Cath and Coronary Angiography (N/A) and possible PCI as a surgical intervention .  The patient's history has been reviewed, patient examined, no change in status, stable for surgery.  I have reviewed the patient's chart and labs.  Questions were answered to the patient's satisfaction.   Ischemic Symptoms? CCS III (Marked limitation of ordinary activity) Anti-ischemic Medical Therapy? Maximal Medical Therapy (2 or more classes of medications) Non-invasive Test Results? No non-invasive testing performed Prior CABG? No Previous CABG   Patient Information:   1-2V CAD, no prox LAD  A (7)  Indication: 20; Score: 7   Patient Information:   1-2V-CAD with DS 50-60% With No FFR, No IVUS  I (3)  Indication: 21; Score: 3   Patient Information:   1-2V-CAD with DS 50-60% With FFR  A (7)  Indication: 22; Score: 7   Patient Information:   1-2V-CAD with DS 50-60% With FFR>0.8, IVUS not significant  I (2)  Indication: 23; Score: 2   Patient Information:   3V-CAD without LMCA With Abnormal LV systolic function  A (9)  Indication: 48; Score: 9   Patient Information:   LMCA-CAD  A (9)  Indication: 49; Score: 9   Patient Information:   2V-CAD with prox LAD PCI  A (7)  Indication: 62; Score: 7   Patient Information:   2V-CAD with prox LAD CABG  A (8)  Indication: 62; Score: 8   Patient Information:   3V-CAD without LMCA With Low CAD burden(i.e., 3 focal stenoses, low SYNTAX score) PCI  A (7)  Indication: 63; Score: 7   Patient Information:   3V-CAD without LMCA With Low CAD burden(i.e., 3 focal stenoses, low  SYNTAX score) CABG  A (9)  Indication: 63; Score: 9   Patient Information:   3V-CAD without LMCA E06c - Intermediate-high CAD burden (i.e., multiple diffuse lesions, presence of CTO, or high SYNTAX score) PCI  U (4)  Indication: 64; Score: 4   Patient Information:   3V-CAD without LMCA E06c - Intermediate-high CAD burden (i.e., multiple diffuse lesions, presence of CTO, or high SYNTAX score) CABG  A (9)  Indication: 64; Score: 9   Patient Information:   LMCA-CAD With Isolated LMCA stenosis  PCI  U (6)  Indication: 65; Score: 6   Patient Information:   LMCA-CAD With Isolated LMCA stenosis  CABG  A (9)  Indication: 65; Score: 9   Patient Information:   LMCA-CAD Additional CAD, low CAD burden (i.e., 1- to 2-vessel additional involvement, low SYNTAX score) PCI  U (5)  Indication: 66; Score: 5   Patient Information:   LMCA-CAD Additional CAD, low CAD burden (i.e., 1- to 2-vessel additional involvement, low SYNTAX score) CABG  A (9)  Indication: 66; Score: 9   Patient Information:   LMCA-CAD Additional CAD, intermediate-high CAD burden (i.e., 3-vessel involvement, presence of CTO, or high SYNTAX score) PCI  I (3)  Indication: 67; Score: 3   Patient Information:   LMCA-CAD Additional CAD, intermediate-high CAD burden (i.e., 3-vessel involvement, presence of CTO, or high SYNTAX score) CABG  A (9)  Indication: 67; Score: 9   Leslie Salazar

## 2015-05-12 ENCOUNTER — Encounter: Payer: Federal, State, Local not specified - PPO | Admitting: Gynecology

## 2015-05-22 ENCOUNTER — Ambulatory Visit (INDEPENDENT_AMBULATORY_CARE_PROVIDER_SITE_OTHER): Payer: Federal, State, Local not specified - PPO | Admitting: Gynecology

## 2015-05-22 ENCOUNTER — Encounter: Payer: Self-pay | Admitting: Gynecology

## 2015-05-22 VITALS — BP 122/80 | Ht 62.0 in | Wt 135.0 lb

## 2015-05-22 DIAGNOSIS — E2839 Other primary ovarian failure: Secondary | ICD-10-CM

## 2015-05-22 DIAGNOSIS — E288 Other ovarian dysfunction: Secondary | ICD-10-CM | POA: Diagnosis not present

## 2015-05-22 DIAGNOSIS — Z853 Personal history of malignant neoplasm of breast: Secondary | ICD-10-CM | POA: Diagnosis not present

## 2015-05-22 DIAGNOSIS — Z01419 Encounter for gynecological examination (general) (routine) without abnormal findings: Secondary | ICD-10-CM

## 2015-05-22 MED ORDER — VALACYCLOVIR HCL 500 MG PO TABS: 500.0000 mg | ORAL_TABLET | Freq: Every day | ORAL | Status: DC

## 2015-05-22 MED ORDER — ESTRADIOL 10 MCG VA TABS: 1.0000 | ORAL_TABLET | VAGINAL | Status: DC

## 2015-05-22 NOTE — Progress Notes (Signed)
Leslie Salazar [facility] 08-07-1965 [id number]   History:    50 y.o.  f with no complaints today. or annual gyn exam Patient several years ago was diagnosed with premature ovarian failure. 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 She was tested for the BRCA1 and BRCA2 N. no mutations were detected. She completed radiation in 2015 has been on tamoxifen which she states will be taken until next year 2018.She's been followed by the oncologist Dr. Verdell Carmine in [place]. In 1994 patient had CO2 laser of cervical dysplasia at another practice. Ever since then her Pap smears have been normal. 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 Patient  had breast reconstruction. Patient's primary doctor Bryon Lions has been doing her blood work.  Patient had been followed in the past for a small echo-free benign appearing 2 cm cyst with no neovascularization of the right ovary. Patient's been asymptomatic otherwise. Patient has an aunt on her father's side with colon cancer and patient has been receiving colonoscopies every 5 years her last colonoscopy was reported to have been in 2015 which was normal.  Patient is using Vagifem 10 g twice a week intravaginally for vaginal atrophy which helped her symptoms. This was approved by her medical oncologist.  Past medical history,surgical history, family history and social history were all reviewed and documented in the EPIC chart.  Gynecologic History No LMP recorded. Patient is postmenopausal. Contraception: post menopausal status Last Pap: 2012 and 2016. Results were: normal Last mammogram: 2016. Results were: normal  Obstetric History OB History  Gravida Para Term Preterm AB SAB TAB Ectopic Multiple Living  _0 # Outcome Date GA Lbr Len/2nd Weight Sex Delivery Anes  PTL Lv  1 Term     M CS-Unspec  N Y       ROS: A ROS was performed and pertinent positives and negatives are included in the history.  GENERAL: No fevers or chills. HEENT: No change in vision, no earache, sore throat or sinus congestion. NECK: No pain or stiffness. CARDIOVASCULAR: No chest pain or pressure. No palpitations. PULMONARY: No shortness of breath, cough or wheeze. GASTROINTESTINAL: No abdominal pain, nausea, vomiting or diarrhea, melena or bright red blood per rectum. GENITOURINARY: No urinary frequency, urgency, hesitancy or dysuria. MUSCULOSKELETAL: No joint or muscle pain, no back pain, no recent trauma. DERMATOLOGIC: No rash, no itching, no lesions. ENDOCRINE: No polyuria, polydipsia, no heat or cold intolerance. No recent change in weight. HEMATOLOGICAL: No anemia or easy bruising or bleeding. NEUROLOGIC: No headache, seizures, numbness, tingling or weakness. PSYCHIATRIC: No depression, no loss of interest in normal activity or change in sleep pattern.     Exam: chaperone present  BP 122/80 mmHg  Ht _1  (1.575 m)  Wt 135 lb (61.236 kg)  BMI 24.69 kg/m2  Body mass index is 24.69 kg/(m^2).  General appearance : Well developed well nourished female. No acute distress HEENT: Eyes: no retinal hemorrhage or exudates,  Neck supple, trachea midline, no carotid bruits, no thyroidmegaly Lungs: Clear to auscultation, no rhonchi or wheezes, or rib retractions  Heart: Regular rate and rhythm, no murmurs or gallops Breast:Examined in sitting and supine position were symmetrical in appearance, no palpable masses or tenderness,  no skin retraction, no nipple inversion, no nipple discharge, no skin discoloration, no  axillary or supraclavicular lymphadenopathy Abdomen: no palpable masses or tenderness, no rebound or guarding Extremities: no edema or skin discoloration or tenderness  Pelvic:  Bartholin, Urethra, Skene Glands: Within normal limits             Vagina: No gross lesions or  discharge  Cervix: No gross lesions or discharge  Uterus  anteverted, normal size, shape and consistency, non-tender and mobile  Adnexa  Without masses or tenderness  Anus and perineum  normal   Rectovaginal  normal sphincter tone without palpated masses or tenderness             Hemoccult not indicated     Assessment/Plan:  50 y.o. 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 She was tested for the BRCA1 and BRCA2 N. no mutations were detected who is doing well currently on tamoxifen. Patient reports no vaginal bleeding. PCP has been doing her blood work and recently started her on statin because of hypercholesterolemia. Patient to follow-up with her medical oncologist. Pap smear not done today in accordance to the new guidelines. Patient's flu vaccine is up-to-date. Her last bone density was normal in 2016.   Terrance Mass MD, 9:00 AM 05/22/2015

## 2015-05-30 ENCOUNTER — Telehealth: Payer: Self-pay | Admitting: *Deleted

## 2015-05-30 MED ORDER — VALACYCLOVIR HCL 500 MG PO TABS: 500.0000 mg | ORAL_TABLET | Freq: Every day | ORAL | Status: DC

## 2015-05-30 NOTE — Telephone Encounter (Signed)
Pt wanted her Valtrex 500 mg 05/22/15 sent to mail order pharmacy [facility], Rx sent.

## 2015-06-03 ENCOUNTER — Other Ambulatory Visit: Payer: Self-pay | Admitting: Gynecology

## 2015-06-03 DIAGNOSIS — Z1211 Encounter for screening for malignant neoplasm of colon: Secondary | ICD-10-CM

## 2015-07-21 DIAGNOSIS — F4323 Adjustment disorder with mixed anxiety and depressed mood: Secondary | ICD-10-CM | POA: Diagnosis not present

## 2015-08-08 DIAGNOSIS — K080 Exfoliation of teeth due to systemic causes: Secondary | ICD-10-CM | POA: Diagnosis not present

## 2015-08-25 DIAGNOSIS — Z9013 Acquired absence of bilateral breasts and nipples: Secondary | ICD-10-CM | POA: Diagnosis not present

## 2015-08-25 DIAGNOSIS — C50911 Malignant neoplasm of unspecified site of right female breast: Secondary | ICD-10-CM | POA: Diagnosis not present

## 2015-08-25 DIAGNOSIS — N651 Disproportion of reconstructed breast: Secondary | ICD-10-CM | POA: Diagnosis not present

## 2015-09-16 ENCOUNTER — Other Ambulatory Visit: Payer: Self-pay | Admitting: Anesthesiology

## 2015-09-16 DIAGNOSIS — N83201 Unspecified ovarian cyst, right side: Secondary | ICD-10-CM

## 2015-10-20 ENCOUNTER — Encounter: Payer: Self-pay | Admitting: Gynecology

## 2015-10-20 ENCOUNTER — Ambulatory Visit (INDEPENDENT_AMBULATORY_CARE_PROVIDER_SITE_OTHER): Payer: Federal, State, Local not specified - PPO

## 2015-10-20 ENCOUNTER — Ambulatory Visit (INDEPENDENT_AMBULATORY_CARE_PROVIDER_SITE_OTHER): Payer: Federal, State, Local not specified - PPO | Admitting: Gynecology

## 2015-10-20 ENCOUNTER — Other Ambulatory Visit: Payer: Self-pay | Admitting: Gynecology

## 2015-10-20 VITALS — BP 114/72

## 2015-10-20 DIAGNOSIS — N83202 Unspecified ovarian cyst, left side: Secondary | ICD-10-CM

## 2015-10-20 DIAGNOSIS — N83201 Unspecified ovarian cyst, right side: Secondary | ICD-10-CM

## 2015-10-20 NOTE — Progress Notes (Signed)
   Patient is a 50 year old who presented to the office to discuss her ultrasound as a result of history of right ovarian cyst. Patient was seen the office in February this year. Her history is as follows:  Patient several years ago had been diagnosed with premature ovarian failure. 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 She was tested for the BRCA1 and BRCA2 N. no mutations were detected. She completed radiation in 2015 has been on tamoxifen which she states will be taken until next year 2018.  Patient had been followed in the past for a small echo-free benign appearing 2 cm cyst with no neovascularization of the right ovary. Patient's been asymptomatic otherwise.  Ultrasound today: Uterus measured 8.0 x 4.5 x 3.4 cm endometrial stripe at 2.2 mm. Small intramural fibroid 16 x 11 mm, 12 x 9 mm. Right ovary normal. Left ovary continued presence of thinwall echo-free cyst measuring 25 x 22 x 19 mm average size 22 mm. Negative color flow. Arterial blood flow was seen to the left ovary.  Ultrasound January 2016 had demonstrated at the left ovary had a cyst measuring 2.4 x 2.3 x 1.8 cm average sized 2.2 cm  Assessment/plan:50 y.o. 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 She was tested for the BRCA1 and BRCA2 N. no mutations were detected who is doing well currently on tamoxifen. Patient reports no vaginal bleeding. With persistent right ovarian cyst which appears to be benign in appearance essentially unchanged. Patient is somewhat anxious and is leaning to having them removed laparoscopically for which literature information will be provided.  Greater than 50% time was spent in counseling  correlating of care for this patient time of consultation 10 minutes

## 2015-10-20 NOTE — Patient Instructions (Signed)
Diagnostic Laparoscopy A diagnostic laparoscopy is a procedure to diagnose diseases in the abdomen. During the procedure, a thin, lighted, pencil-sized instrument called a laparoscope is inserted into the abdomen through an incision. The laparoscope allows your health care provider to look at the organs inside your body. LET [facility] CARE PROVIDER KNOW ABOUT:  Any allergies you have.  All medicines you are taking, including vitamins, herbs, eye drops, creams, and over-the-counter medicines.  Previous problems you or members of your family have had with the use of anesthetics.  Any blood disorders you have.  Previous surgeries you have had.  Medical conditions you have. RISKS AND COMPLICATIONS  Generally, this is a safe procedure. However, problems can occur, which may include:  Infection.  Bleeding.  Damage to other organs.  Allergic reaction to the anesthetics used during the procedure. BEFORE THE PROCEDURE  Do not eat or drink anything after midnight on the night before the procedure or as directed by your health care provider.  Ask your health care provider about:  Changing or stopping your regular medicines.  Taking medicines such as aspirin and ibuprofen. These medicines can thin your blood. Do not take these medicines before your procedure if your health care provider instructs you not to.  Plan to have someone take you home after the procedure. PROCEDURE  You may be given a medicine to help you relax (sedative).  You will be given a medicine to make you sleep (general anesthetic).  Your abdomen will be inflated with a gas. This will make your organs easier to see.  Small incisions will be made in your abdomen.  A laparoscope and other small instruments will be inserted into the abdomen through the incisions.  A tissue sample may be removed from an organ in the abdomen for examination.  The instruments will be removed from the abdomen.  The gas will be  released.  The incisions will be closed with stitches (sutures). AFTER THE PROCEDURE  Your blood pressure, heart rate, breathing rate, and blood oxygen level will be monitored often until the medicines you were given have worn off.   This information is not intended to replace advice given to you by your health care provider. Make sure you discuss any questions you have with your health care provider.   Document Released: 07/05/2000 Document Revised: 12/18/2014 Document Reviewed: 11/09/2013 Elsevier Interactive Patient Education 2016 [place]. Ovarian Cyst An ovarian cyst is a fluid-filled sac that forms on an ovary. The ovaries are small organs that produce eggs in women. Various types of cysts can form on the ovaries. Most are not cancerous. Many do not cause problems, and they often go away on their own. Some may cause symptoms and require treatment. Common types of ovarian cysts include:  Functional cysts--These cysts may occur every month during the menstrual cycle. This is normal. The cysts usually go away with the next menstrual cycle if the woman does not get pregnant. Usually, there are no symptoms with a functional cyst.  Endometrioma cysts--These cysts form from the tissue that lines the uterus. They are also called "chocolate cysts" because they become filled with blood that turns brown. This type of cyst can cause pain in the lower abdomen during intercourse and with your menstrual period.  Cystadenoma cysts--This type develops from the cells on the outside of the ovary. These cysts can get very big and cause lower abdomen pain and pain with intercourse. This type of cyst can twist on itself, cut off its  blood supply, and cause severe pain. It can also easily rupture and cause a lot of pain.  Dermoid cysts--This type of cyst is sometimes found in both ovaries. These cysts may contain different kinds of body tissue, such as skin, teeth, hair, or cartilage. They usually do not  cause symptoms unless they get very big.  Theca lutein cysts--These cysts occur when too much of a certain hormone (human chorionic gonadotropin) is produced and overstimulates the ovaries to produce an egg. This is most common after procedures used to assist with the conception of a baby (in vitro fertilization). CAUSES   Fertility drugs can cause a condition in which multiple large cysts are formed on the ovaries. This is called ovarian hyperstimulation syndrome.  A condition called polycystic ovary syndrome can cause hormonal imbalances that can lead to nonfunctional ovarian cysts. SIGNS AND SYMPTOMS  Many ovarian cysts do not cause symptoms. If symptoms are present, they may include:  Pelvic pain or pressure.  Pain in the lower abdomen.  Pain during sexual intercourse.  Increasing girth (swelling) of the abdomen.  Abnormal menstrual periods.  Increasing pain with menstrual periods.  Stopping having menstrual periods without being pregnant. DIAGNOSIS  These cysts are commonly found during a routine or annual pelvic exam. Tests may be ordered to find out more about the cyst. These tests may include:  Ultrasound.  X-ray of the pelvis.  CT scan.  MRI.  Blood tests. TREATMENT  Many ovarian cysts go away on their own without treatment. Your health care provider may want to check your cyst regularly for 2-3 months to see if it changes. For women in menopause, it is particularly important to monitor a cyst closely because of the higher rate of ovarian cancer in menopausal women. When treatment is needed, it may include any of the following:  A procedure to drain the cyst (aspiration). This may be done using a long needle and ultrasound. It can also be done through a laparoscopic procedure. This involves using a thin, lighted tube with a tiny camera on the end (laparoscope) inserted through a small incision.  Surgery to remove the whole cyst. This may be done using laparoscopic  surgery or an open surgery involving a larger incision in the lower abdomen.  Hormone treatment or birth control pills. These methods are sometimes used to help dissolve a cyst. HOME CARE INSTRUCTIONS   Only take over-the-counter or prescription medicines as directed by your health care provider.  Follow up with your health care provider as directed.  Get regular pelvic exams and Pap tests. SEEK MEDICAL CARE IF:   Your periods are late, irregular, or painful, or they stop.  Your pelvic pain or abdominal pain does not go away.  Your abdomen becomes larger or swollen.  You have pressure on your bladder or trouble emptying your bladder completely.  You have pain during sexual intercourse.  You have feelings of fullness, pressure, or discomfort in your stomach.  You lose weight for no apparent reason.  You feel generally ill.  You become constipated.  You lose your appetite.  You develop acne.  You have an increase in body and facial hair.  You are gaining weight, without changing your exercise and eating habits.  You think you are pregnant. SEEK IMMEDIATE MEDICAL CARE IF:   You have increasing abdominal pain.  You feel sick to your stomach (nauseous), and you throw up (vomit).  You develop a fever that comes on suddenly.  You have abdominal pain during  a bowel movement.  Your menstrual periods become heavier than usual. MAKE SURE YOU:  Understand these instructions.  Will watch your condition.  Will get help right away if you are not doing well or get worse.   This information is not intended to replace advice given to you by your health care provider. Make sure you discuss any questions you have with your health care provider.   Document Released: 03/29/2005 Document Revised: 04/03/2013 Document Reviewed: 12/04/2012 Elsevier Interactive Patient Education [employer].

## 2015-11-26 DIAGNOSIS — Z0000 Encounter for general adult medical examination without abnormal findings: Secondary | ICD-10-CM | POA: Diagnosis not present

## 2015-11-26 DIAGNOSIS — E785 Hyperlipidemia, unspecified: Secondary | ICD-10-CM | POA: Diagnosis not present

## 2015-11-26 DIAGNOSIS — I10 Essential (primary) hypertension: Secondary | ICD-10-CM | POA: Diagnosis not present

## 2015-11-26 DIAGNOSIS — Z79899 Other long term (current) drug therapy: Secondary | ICD-10-CM | POA: Diagnosis not present

## 2015-12-23 DIAGNOSIS — C50111 Malignant neoplasm of central portion of right female breast: Secondary | ICD-10-CM | POA: Diagnosis not present

## 2015-12-23 DIAGNOSIS — Z1371 Encounter for nonprocreative screening for genetic disease carrier status: Secondary | ICD-10-CM | POA: Diagnosis not present

## 2015-12-23 DIAGNOSIS — Z9013 Acquired absence of bilateral breasts and nipples: Secondary | ICD-10-CM | POA: Diagnosis not present

## 2015-12-23 DIAGNOSIS — D0511 Intraductal carcinoma in situ of right breast: Secondary | ICD-10-CM | POA: Diagnosis not present

## 2015-12-23 DIAGNOSIS — Z5181 Encounter for therapeutic drug level monitoring: Secondary | ICD-10-CM | POA: Diagnosis not present

## 2015-12-23 DIAGNOSIS — N898 Other specified noninflammatory disorders of vagina: Secondary | ICD-10-CM | POA: Diagnosis not present

## 2015-12-23 DIAGNOSIS — Z170 Estrogen receptor positive status [ER+]: Secondary | ICD-10-CM | POA: Diagnosis not present

## 2015-12-29 DIAGNOSIS — I10 Essential (primary) hypertension: Secondary | ICD-10-CM | POA: Diagnosis not present

## 2016-02-17 DIAGNOSIS — K080 Exfoliation of teeth due to systemic causes: Secondary | ICD-10-CM | POA: Diagnosis not present

## 2016-03-08 DIAGNOSIS — E7800 Pure hypercholesterolemia, unspecified: Secondary | ICD-10-CM | POA: Diagnosis not present

## 2016-03-08 DIAGNOSIS — I10 Essential (primary) hypertension: Secondary | ICD-10-CM | POA: Diagnosis not present

## 2016-03-08 DIAGNOSIS — I340 Nonrheumatic mitral (valve) insufficiency: Secondary | ICD-10-CM | POA: Diagnosis not present

## 2016-03-08 DIAGNOSIS — R079 Chest pain, unspecified: Secondary | ICD-10-CM | POA: Diagnosis not present

## 2016-03-22 ENCOUNTER — Telehealth: Payer: Self-pay | Admitting: *Deleted

## 2016-03-22 MED ORDER — ESTRADIOL 10 MCG VA TABS: 1.0000 | ORAL_TABLET | VAGINAL | 0 refills | Status: DC

## 2016-03-22 NOTE — Telephone Encounter (Signed)
Patient has new pharmacy needs vagifem 10 mcg 90 day supply sent to [facility] [address] in [place], Rx sent, pt informed.

## 2016-05-23 ENCOUNTER — Other Ambulatory Visit: Payer: Self-pay | Admitting: Gynecology

## 2016-05-24 ENCOUNTER — Ambulatory Visit (INDEPENDENT_AMBULATORY_CARE_PROVIDER_SITE_OTHER): Payer: Federal, State, Local not specified - PPO | Admitting: Gynecology

## 2016-05-24 ENCOUNTER — Encounter: Payer: Self-pay | Admitting: Gynecology

## 2016-05-24 VITALS — BP 126/80 | Ht 62.0 in | Wt 135.0 lb

## 2016-05-24 DIAGNOSIS — N83202 Unspecified ovarian cyst, left side: Secondary | ICD-10-CM | POA: Diagnosis not present

## 2016-05-24 DIAGNOSIS — Z853 Personal history of malignant neoplasm of breast: Secondary | ICD-10-CM

## 2016-05-24 DIAGNOSIS — Z01411 Encounter for gynecological examination (general) (routine) with abnormal findings: Secondary | ICD-10-CM

## 2016-05-24 DIAGNOSIS — Z780 Asymptomatic menopausal state: Secondary | ICD-10-CM | POA: Diagnosis not present

## 2016-05-24 NOTE — Progress Notes (Signed)
Leslie Salazar [facility] 1965/05/17 [id number]   History:    51 y.o.  for annual gyn exam who is asymptomatic today. Review of her record indicated that several years ago she was diagnosed with premature ovarian failure. 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 She was tested for the BRCA1 and BRCA2 N. no mutations were detected. She completed radiation in 2015 has been on tamoxifen which she states will be taken until next year 2018.She's been followed by the oncologist Dr. Verdell Carmine in [place]. In 1994 patient had CO2 laser of cervical dysplasia at another practice. Ever since then her Pap smears have been normal. 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 Patient  had breast reconstruction. Patient's primary doctor Bryon Lions has been doing her blood work. She is on her fifth and possibly final year of tamoxifen.  Patient had been followed in the past for a small echo-free benign appearing 2 cm cyst with no neovascularization of the right ovary. Patient's been asymptomatic otherwise. Patient has an aunt on her father's side with colon cancer and patient has been receiving colonoscopies every 5 years her last colonoscopy was reported to have been in 2015 which was normal. Patient's father has had history of colon cancer for this reason patient is being screened every 5 years. Her last colonoscopy was normal 2 years ago. Her last bone density study in 2016 was normal.  Patient is using Vagifem 10 g twice a week intravaginally for vaginal atrophy which helped her symptoms. This was approved by her medical oncologist.   Past medical history,surgical history, family history and social history were all reviewed and documented in the EPIC chart.  Gynecologic History No LMP recorded. Patient is  postmenopausal. Contraception: post menopausal status Last Pap: 2016. Results were: normal Last mammogram: See above. Results were: abnormal  Obstetric History OB History  Gravida Para Term Preterm AB Living  '1 1 1     1  '$ SAB TAB Ectopic Multiple Live Births          1    # Outcome Date GA Lbr Len/2nd Weight Sex Delivery Anes PTL Lv  1 Term     M CS-Unspec  N LIV       ROS: A ROS was performed and pertinent positives and negatives are included in the history.  GENERAL: No fevers or chills. HEENT: No change in vision, no earache, sore throat or sinus congestion. NECK: No pain or stiffness. CARDIOVASCULAR: No chest pain or pressure. No palpitations. PULMONARY: No shortness of breath, cough or wheeze. GASTROINTESTINAL: No abdominal pain, nausea, vomiting or diarrhea, melena or bright red blood per rectum. GENITOURINARY: No urinary frequency, urgency, hesitancy or dysuria. MUSCULOSKELETAL: No joint or muscle pain, no back pain, no recent trauma. DERMATOLOGIC: No rash, no itching, no lesions. ENDOCRINE: No polyuria, polydipsia, no heat or cold intolerance. No recent change in weight. HEMATOLOGICAL: No anemia or easy bruising or bleeding. NEUROLOGIC: No headache, seizures, numbness, tingling or weakness. PSYCHIATRIC: No depression, no loss of interest in normal activity or change in sleep pattern.     Exam: chaperone present  BP 126/80   Ht '5\' 2"'$  (1.575 m)   Wt 135 lb (61.2 kg)   BMI 24.69 kg/m   Body mass index is 24.69 kg/m.  General appearance : Well developed well nourished female. No acute distress HEENT: Eyes:  no retinal hemorrhage or exudates,  Neck supple, trachea midline, no carotid bruits, no thyroidmegaly Lungs: Clear to auscultation, no rhonchi or wheezes, or rib retractions  Heart: Regular rate and rhythm, no murmurs or gallops Breast:Examined in sitting and supine position were symmetrical in appearance, no palpable masses or tenderness,  no skin retraction, no nipple  inversion, no nipple discharge, no skin discoloration, no axillary or supraclavicular lymphadenopathy Abdomen: no palpable masses or tenderness, no rebound or guarding Extremities: no edema or skin discoloration or tenderness  Pelvic:  Bartholin, Urethra, Skene Glands: Within normal limits             Vagina: No gross lesions or discharge  Cervix: No lesions or discharge  Uterus  see above, normal size, shape and consistency, non-tender and mobile  Adnexa  Without masses or tenderness  Anus and perineum  normal   Rectovaginal  normal sphincter tone without palpated masses or tenderness             Hemoccult PCP provides    with history of 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 She was tested for the BRCA1 and BRCA2 N. no mutations were detected who is doing well currently on tamoxifen. Patient reports no vaginal bleeding. PCP has been doing her blood work and recently started her on statin because of hypercholesterolemia. Patient to follow-up with her medical oncologist. Pap smear not done today in accordance to the new guidelines. Patient's flu vaccine is up-to-date. Her last bone density was normal in 2016 and she will be schedule no urinary office in the next few weeks. In July she will have her follow-up yearly ultrasound to follow-up on the small ovarian cyst and because of her history of breast cancer. We discussed again the importance of calcium vitamin D and weightbearing exercises for osteoporosis prevention. Assessment/Plan:  51 y.o. female for annual exam    Terrance Mass MD, 9:11 AM 05/24/2016

## 2016-05-24 NOTE — Telephone Encounter (Signed)
Patient was seen today.

## 2016-05-24 NOTE — Telephone Encounter (Signed)
Check with Leslie Salazar I gave her a written prescription for 1 month supply and 11 refills and she was supposed to have given it to her

## 2016-05-24 NOTE — Patient Instructions (Signed)
Bone Densitometry Introduction Bone densitometry is an imaging test that uses a special X-ray to measure the amount of calcium and other minerals in your bones (bone density). This test is also known as a bone mineral density test or dual-energy X-ray absorptiometry (DXA). The test can measure bone density at your hip and your spine. It is similar to having a regular X-ray. You may have this test to:  Diagnose a condition that causes weak or thin bones (osteoporosis).  Predict your risk of a broken bone (fracture).  Determine how well osteoporosis treatment is working. Tell a health care provider about:  Any allergies you have.  All medicines you are taking, including vitamins, herbs, eye drops, creams, and over-the-counter medicines.  Any problems you or family members have had with anesthetic medicines.  Any blood disorders you have.  Any surgeries you have had.  Any medical conditions you have.  Possibility of pregnancy.  Any other medical test you had within the previous 14 days that used contrast material. What are the risks? Generally, this is a safe procedure. However, problems can occur and may include the following:  This test exposes you to a very small amount of radiation.  The risks of radiation exposure may be greater to unborn children. What happens before the procedure?  Do not take any calcium supplements for 24 hours before having the test. You can otherwise eat and drink what you usually do.  Take off all metal jewelry, eyeglasses, dental appliances, and any other metal objects. What happens during the procedure?  You may lie on an exam table. There will be an X-ray generator below you and an imaging device above you.  Other devices, such as boxes or braces, may be used to position your body properly for the scan.  You will need to lie still while the machine slowly scans your body.  The images will show up on a computer monitor. What happens after the  procedure? You may need more testing at a later time. This information is not intended to replace advice given to you by your health care provider. Make sure you discuss any questions you have with your health care provider. Document Released: 04/20/2004 Document Revised: 09/04/2015 Document Reviewed: 09/06/2013  2017 Elsevier  

## 2016-06-03 DIAGNOSIS — I10 Essential (primary) hypertension: Secondary | ICD-10-CM | POA: Diagnosis not present

## 2016-06-03 DIAGNOSIS — Z6825 Body mass index (BMI) 25.0-25.9, adult: Secondary | ICD-10-CM | POA: Diagnosis not present

## 2016-06-03 DIAGNOSIS — Z79899 Other long term (current) drug therapy: Secondary | ICD-10-CM | POA: Diagnosis not present

## 2016-06-08 ENCOUNTER — Ambulatory Visit (INDEPENDENT_AMBULATORY_CARE_PROVIDER_SITE_OTHER): Payer: Federal, State, Local not specified - PPO

## 2016-06-08 ENCOUNTER — Other Ambulatory Visit: Payer: Self-pay | Admitting: Gynecology

## 2016-06-08 DIAGNOSIS — Z780 Asymptomatic menopausal state: Secondary | ICD-10-CM

## 2016-06-08 DIAGNOSIS — Z13820 Encounter for screening for osteoporosis: Secondary | ICD-10-CM | POA: Diagnosis not present

## 2016-06-08 DIAGNOSIS — Z853 Personal history of malignant neoplasm of breast: Secondary | ICD-10-CM

## 2016-08-18 DIAGNOSIS — N898 Other specified noninflammatory disorders of vagina: Secondary | ICD-10-CM | POA: Diagnosis not present

## 2016-08-18 DIAGNOSIS — Z170 Estrogen receptor positive status [ER+]: Secondary | ICD-10-CM | POA: Diagnosis not present

## 2016-08-18 DIAGNOSIS — D0511 Intraductal carcinoma in situ of right breast: Secondary | ICD-10-CM | POA: Diagnosis not present

## 2016-08-18 DIAGNOSIS — C50111 Malignant neoplasm of central portion of right female breast: Secondary | ICD-10-CM | POA: Diagnosis not present

## 2016-08-18 DIAGNOSIS — Z9882 Breast implant status: Secondary | ICD-10-CM | POA: Diagnosis not present

## 2016-08-18 DIAGNOSIS — I10 Essential (primary) hypertension: Secondary | ICD-10-CM | POA: Diagnosis not present

## 2016-08-18 DIAGNOSIS — Z9013 Acquired absence of bilateral breasts and nipples: Secondary | ICD-10-CM | POA: Diagnosis not present

## 2016-08-18 DIAGNOSIS — Z9189 Other specified personal risk factors, not elsewhere classified: Secondary | ICD-10-CM | POA: Diagnosis not present

## 2016-08-18 DIAGNOSIS — Z5181 Encounter for therapeutic drug level monitoring: Secondary | ICD-10-CM | POA: Diagnosis not present

## 2016-08-24 DIAGNOSIS — K080 Exfoliation of teeth due to systemic causes: Secondary | ICD-10-CM | POA: Diagnosis not present

## 2016-08-25 ENCOUNTER — Encounter: Payer: Self-pay | Admitting: Gynecology

## 2016-09-14 ENCOUNTER — Other Ambulatory Visit: Payer: Self-pay | Admitting: Gynecology

## 2016-10-20 ENCOUNTER — Ambulatory Visit (INDEPENDENT_AMBULATORY_CARE_PROVIDER_SITE_OTHER): Payer: Federal, State, Local not specified - PPO

## 2016-10-20 ENCOUNTER — Encounter: Payer: Self-pay | Admitting: Gynecology

## 2016-10-20 ENCOUNTER — Ambulatory Visit (INDEPENDENT_AMBULATORY_CARE_PROVIDER_SITE_OTHER): Payer: Federal, State, Local not specified - PPO | Admitting: Gynecology

## 2016-10-20 VITALS — BP 120/78

## 2016-10-20 DIAGNOSIS — N83202 Unspecified ovarian cyst, left side: Secondary | ICD-10-CM | POA: Diagnosis not present

## 2016-10-20 DIAGNOSIS — Z853 Personal history of malignant neoplasm of breast: Secondary | ICD-10-CM

## 2016-10-20 NOTE — Progress Notes (Signed)
   Patient is a 51 year old that presented to the office for her annual follow-up on a small ovarian cysts that is average in size 2 cm since 2012. She is otherwise asymptomatic.Review of her record indicated that several years ago she was diagnosed with premature ovarian failure. 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 She was tested for the BRCA1 and BRCA2 N. no mutations were detected. She completed radiation in 2015 has been on tamoxifen which she states will be taken until next year.  Ultrasound today: Uterus measured 8.6 x 4.7 x 3.6 endometrial stripe 2.4 mm. Right ovary normal. Left ovary thinwall echo-free avascular cyst 25 x 23 x 24 mm, no change from previous ultrasound.  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 She was tested for the BRCA1 and BRCA2 N. no mutations were detected who is doing well currently on tamoxifen. Patient with benign-appearing supple simple cyst since 2012 essentially unchanged. We'll get a CA 125 this year. Otherwise she is scheduled to return back next year for annual exam.

## 2016-10-21 LAB — CA 125: CA 125: 8 U/mL (ref <35)

## 2016-12-10 ENCOUNTER — Other Ambulatory Visit: Payer: Self-pay | Admitting: *Deleted

## 2016-12-10 MED ORDER — VALACYCLOVIR HCL 500 MG PO TABS: 500.0000 mg | ORAL_TABLET | Freq: Every day | ORAL | 2 refills | Status: DC

## 2016-12-30 DIAGNOSIS — I10 Essential (primary) hypertension: Secondary | ICD-10-CM | POA: Diagnosis not present

## 2016-12-30 DIAGNOSIS — Z86010 Personal history of colonic polyps: Secondary | ICD-10-CM | POA: Diagnosis not present

## 2016-12-30 DIAGNOSIS — Z0000 Encounter for general adult medical examination without abnormal findings: Secondary | ICD-10-CM | POA: Diagnosis not present

## 2016-12-30 DIAGNOSIS — Z23 Encounter for immunization: Secondary | ICD-10-CM | POA: Diagnosis not present

## 2017-02-28 DIAGNOSIS — R32 Unspecified urinary incontinence: Secondary | ICD-10-CM | POA: Diagnosis not present

## 2017-02-28 DIAGNOSIS — Z170 Estrogen receptor positive status [ER+]: Secondary | ICD-10-CM | POA: Diagnosis not present

## 2017-02-28 DIAGNOSIS — N3941 Urge incontinence: Secondary | ICD-10-CM | POA: Diagnosis not present

## 2017-02-28 DIAGNOSIS — I10 Essential (primary) hypertension: Secondary | ICD-10-CM | POA: Diagnosis not present

## 2017-02-28 DIAGNOSIS — C50111 Malignant neoplasm of central portion of right female breast: Secondary | ICD-10-CM | POA: Diagnosis not present

## 2017-02-28 DIAGNOSIS — E663 Overweight: Secondary | ICD-10-CM | POA: Diagnosis not present

## 2017-03-02 DIAGNOSIS — K080 Exfoliation of teeth due to systemic causes: Secondary | ICD-10-CM | POA: Diagnosis not present

## 2017-05-05 DIAGNOSIS — J019 Acute sinusitis, unspecified: Secondary | ICD-10-CM | POA: Diagnosis not present

## 2017-05-05 DIAGNOSIS — B9689 Other specified bacterial agents as the cause of diseases classified elsewhere: Secondary | ICD-10-CM | POA: Diagnosis not present

## 2017-05-05 DIAGNOSIS — I10 Essential (primary) hypertension: Secondary | ICD-10-CM | POA: Diagnosis not present

## 2017-05-25 ENCOUNTER — Ambulatory Visit (INDEPENDENT_AMBULATORY_CARE_PROVIDER_SITE_OTHER): Payer: Federal, State, Local not specified - PPO | Admitting: Obstetrics & Gynecology

## 2017-05-25 ENCOUNTER — Encounter: Payer: Self-pay | Admitting: Obstetrics & Gynecology

## 2017-05-25 VITALS — BP 126/82 | Ht 62.5 in | Wt 142.0 lb

## 2017-05-25 DIAGNOSIS — Z8619 Personal history of other infectious and parasitic diseases: Secondary | ICD-10-CM | POA: Diagnosis not present

## 2017-05-25 DIAGNOSIS — D0511 Intraductal carcinoma in situ of right breast: Secondary | ICD-10-CM | POA: Diagnosis not present

## 2017-05-25 DIAGNOSIS — Z780 Asymptomatic menopausal state: Secondary | ICD-10-CM | POA: Diagnosis not present

## 2017-05-25 DIAGNOSIS — Z01419 Encounter for gynecological examination (general) (routine) without abnormal findings: Secondary | ICD-10-CM

## 2017-05-25 DIAGNOSIS — N83202 Unspecified ovarian cyst, left side: Secondary | ICD-10-CM

## 2017-05-25 DIAGNOSIS — N952 Postmenopausal atrophic vaginitis: Secondary | ICD-10-CM

## 2017-05-25 DIAGNOSIS — K640 First degree hemorrhoids: Secondary | ICD-10-CM

## 2017-05-25 MED ORDER — VALACYCLOVIR HCL 500 MG PO TABS: 500.0000 mg | ORAL_TABLET | Freq: Every day | ORAL | 4 refills | Status: DC

## 2017-05-25 MED ORDER — HYDROCORTISONE 2.5 % RE CREA: 1.0000 "application " | TOPICAL_CREAM | Freq: Two times a day (BID) | RECTAL | 3 refills | Status: DC

## 2017-05-25 MED ORDER — ESTRADIOL 10 MCG VA TABS: 1.0000 | ORAL_TABLET | VAGINAL | 4 refills | Status: DC

## 2017-05-25 NOTE — Progress Notes (Signed)
Leslie Salazar [facility] 1966-01-13 [id number]   History:    52 y.o. G1P1L1  Married.  Son doing well, 51 yo.  RP:  Established patient presenting for annual gyn exam   HPI: Menopause.  Right breast Ca post Bilateral Mastectomy.  On Tamoxifen.  Followed by Oncologist.  No pelvic pain. Vaginal dryness/pain with IC, on Vagifem weekly, authorized by Oncologist.  Followed with Pelvic US/Ca 125 every year for a stable Ovarian cyst.  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  C/O a symptomatic hemorrhoid. Fasting Health labs with Fam MD.  Past medical history,surgical history, family history and social history were all reviewed and documented in the EPIC chart.  Gynecologic History No LMP recorded. Patient is postmenopausal. Contraception: post menopausal status Last Pap: 04/2014. Results were: Negative Last mammogram: 01/2011. Results were: abnormal.  S/P Bilateral Mastectomy for Rt Breast Ca. Bone Density: 05/2016 Normal Colonoscopy: 3 yrs ago  Obstetric History OB History  Gravida Para Term Preterm AB Living  1 1 1     1   SAB TAB Ectopic Multiple Live Births          1    # Outcome Date GA Lbr Len/2nd Weight Sex Delivery Anes PTL Lv  1 Term     M CS-Unspec  N LIV       ROS: A ROS was performed and pertinent positives and negatives are included in the history.  GENERAL: No fevers or chills. HEENT: No change in vision, no earache, sore throat or sinus congestion. NECK: No pain or stiffness. CARDIOVASCULAR: No chest pain or pressure. No palpitations. PULMONARY: No shortness of breath, cough or wheeze. GASTROINTESTINAL: No abdominal pain, nausea, vomiting or diarrhea, melena or bright red blood per rectum. GENITOURINARY: No urinary frequency, urgency, hesitancy or dysuria. MUSCULOSKELETAL: No joint or muscle pain, no back pain, no recent trauma. DERMATOLOGIC: No rash, no itching, no lesions. ENDOCRINE: No polyuria, polydipsia, no heat or cold intolerance. No recent change in weight.  HEMATOLOGICAL: No anemia or easy bruising or bleeding. NEUROLOGIC: No headache, seizures, numbness, tingling or weakness. PSYCHIATRIC: No depression, no loss of interest in normal activity or change in sleep pattern.     Exam:   BP 126/82   Ht 5' 2.5" (1.588 m)   Wt 142 lb (64.4 kg)   BMI 25.56 kg/m   Body mass index is 25.56 kg/m.  General appearance : Well developed well nourished female. No acute distress HEENT: Eyes: no retinal hemorrhage or exudates,  Neck supple, trachea midline, no carotid bruits, no thyroidmegaly Lungs: Clear to auscultation, no rhonchi or wheezes, or rib retractions  Heart: Regular rate and rhythm, no murmurs or gallops Breast:Examined in sitting and supine position were symmetrical in appearance, no palpable masses or tenderness,  no skin retraction, no nipple inversion, no nipple discharge, no skin discoloration, no axillary or supraclavicular lymphadenopathy Abdomen: no palpable masses or tenderness, no rebound or guarding Extremities: no edema or skin discoloration or tenderness  Pelvic: Vulva: Normal             Vagina: No gross lesions or discharge  Cervix: No gross lesions or discharge.  Pap reflex done.  Uterus  AV, normal size, shape and consistency, non-tender and mobile  Adnexa  Without masses or tenderness  Anus: Small external hemorrhoid   Assessment/Plan:  52 y.o. female for annual exam   1. Left ovarian cyst History of left ovarian cyst followed by pelvic ultrasounds with Dr. Toney Rakes.  Patient will follow up for repeat pelvic  ultrasound and will do a Ca1 25 today. - US Transvaginal Non-OB; Future - CA 125  2. Encounter for routine gynecological examination with Papanicolaou smear of cervix Normal gynecologic exam.  Pap reflex done today.  Status post bilateral mastectomy.  Recommend regular physical activity.  3. Menopause present Menopause, well on no hormone replacement therapy.  No postmenopausal bleeding.  4. Post-menopause  atrophic vaginitis Improved on Vagifem but using only once a week and still feeling some dryness and pain with intercourse.  Recommend using twice a week.  Recommend coconut oil for intercourse.  5. Ductal carcinoma in situ (DCIS) of right breast Status post bilateral mastectomies.  6. H/O herpes genitalis Valacyclovir prophylaxis represcribed.  7. Grade I hemorrhoids Anusol 2.5% rectal cream prescribed.  Other orders - valACYclovir (VALTREX) 500 MG tablet; Take 1 tablet (500 mg total) by mouth daily. - Estradiol 10 MCG TABS vaginal tablet; Place 1 tablet (10 mcg total) vaginally 2 (two) times a week. - hydrocortisone (ANUSOL-HC) 2.5 % rectal cream; Place 1 application rectally 2 (two) times daily.  Counseling on above issues more than 50% for 10 minutes.   Princess Bruins MD, 9:23 AM 05/25/2017

## 2017-05-26 LAB — CA 125: CA 125: 11 U/mL (ref <35)

## 2017-05-28 LAB — PAP IG W/ RFLX HPV ASCU

## 2017-05-29 ENCOUNTER — Encounter: Payer: Self-pay | Admitting: Obstetrics & Gynecology

## 2017-05-29 NOTE — Patient Instructions (Signed)
1. Left ovarian cyst History of left ovarian cyst followed by pelvic ultrasounds with Dr. Toney Rakes.  Patient will follow up for repeat pelvic ultrasound and will do a Ca1 25 today. - US Transvaginal Non-OB; Future - CA 125  2. Encounter for routine gynecological examination with Papanicolaou smear of cervix Normal gynecologic exam.  Pap reflex done today.  Status post bilateral mastectomy.  Recommend regular physical activity.  3. Menopause present Menopause, well on no hormone replacement therapy.  No postmenopausal bleeding.  4. Post-menopause atrophic vaginitis Improved on Vagifem but using only once a week and still feeling some dryness and pain with intercourse.  Recommend using twice a week.  Recommend coconut oil for intercourse.  5. Ductal carcinoma in situ (DCIS) of right breast Status post bilateral mastectomies.  6. H/O herpes genitalis Valacyclovir prophylaxis represcribed.  7. Grade I hemorrhoids Anusol 2.5% rectal cream prescribed.  Other orders - valACYclovir (VALTREX) 500 MG tablet; Take 1 tablet (500 mg total) by mouth daily. - Estradiol 10 MCG TABS vaginal tablet; Place 1 tablet (10 mcg total) vaginally 2 (two) times a week. - hydrocortisone (ANUSOL-HC) 2.5 % rectal cream; Place 1 application rectally 2 (two) times daily.    Health Maintenance for Postmenopausal Women Menopause is a normal process in which your reproductive ability comes to an end. This process happens gradually over a span of months to years, usually between the ages of 86 and 40. Menopause is complete when you have missed 12 consecutive menstrual periods. It is important to talk with your health care provider about some of the most common conditions that affect postmenopausal women, such as heart disease, cancer, and bone loss (osteoporosis). Adopting a healthy lifestyle and getting preventive care can help to promote your health and wellness. Those actions can also lower your chances of  developing some of these common conditions. What should I know about menopause? During menopause, you may experience a number of symptoms, such as:  Moderate-to-severe hot flashes.  Night sweats.  Decrease in sex drive.  Mood swings.  Headaches.  Tiredness.  Irritability.  Memory problems.  Insomnia.  Choosing to treat or not to treat menopausal changes is an individual decision that you make with your health care provider. What should I know about hormone replacement therapy and supplements? Hormone therapy products are effective for treating symptoms that are associated with menopause, such as hot flashes and night sweats. Hormone replacement carries certain risks, especially as you become older. If you are thinking about using estrogen or estrogen with progestin treatments, discuss the benefits and risks with your health care provider. What should I know about heart disease and stroke? Heart disease, heart attack, and stroke become more likely as you age. This may be due, in part, to the hormonal changes that your body experiences during menopause. These can affect how your body processes dietary fats, triglycerides, and cholesterol. Heart attack and stroke are both medical emergencies. There are many things that you can do to help prevent heart disease and stroke:  Have your blood pressure checked at least every 1-2 years. High blood pressure causes heart disease and increases the risk of stroke.  If you are 38-44 years old, ask your health care provider if you should take aspirin to prevent a heart attack or a stroke.  Do not use any tobacco products, including cigarettes, chewing tobacco, or electronic cigarettes. If you need help quitting, ask your health care provider.  It is important to eat a healthy diet and maintain a  healthy weight. ? Be sure to include plenty of vegetables, fruits, low-fat dairy products, and lean protein. ? Avoid eating foods that are high in solid  fats, added sugars, or salt (sodium).  Get regular exercise. This is one of the most important things that you can do for your health. ? Try to exercise for at least 150 minutes each week. The type of exercise that you do should increase your heart rate and make you sweat. This is known as moderate-intensity exercise. ? Try to do strengthening exercises at least twice each week. Do these in addition to the moderate-intensity exercise.  Know your numbers.Ask your health care provider to check your cholesterol and your blood glucose. Continue to have your blood tested as directed by your health care provider.  What should I know about cancer screening? There are several types of cancer. Take the following steps to reduce your risk and to catch any cancer development as early as possible. Breast Cancer  Practice breast self-awareness. ? This means understanding how your breasts normally appear and feel. ? It also means doing regular breast self-exams. Let your health care provider know about any changes, no matter how small.  If you are 25 or older, have a clinician do a breast exam (clinical breast exam or CBE) every year. Depending on your age, family history, and medical history, it may be recommended that you also have a yearly breast X-ray (mammogram).  If you have a family history of breast cancer, talk with your health care provider about genetic screening.  If you are at high risk for breast cancer, talk with your health care provider about having an MRI and a mammogram every year.  Breast cancer (BRCA) gene test is recommended for women who have family members with BRCA-related cancers. Results of the assessment will determine the need for genetic counseling and BRCA1 and for BRCA2 testing. BRCA-related cancers include these types: ? Breast. This occurs in males or females. ? Ovarian. ? Tubal. This may also be called fallopian tube cancer. ? Cancer of the abdominal or pelvic lining  (peritoneal cancer). ? Prostate. ? Pancreatic.  Cervical, Uterine, and Ovarian Cancer Your health care provider may recommend that you be screened regularly for cancer of the pelvic organs. These include your ovaries, uterus, and vagina. This screening involves a pelvic exam, which includes checking for microscopic changes to the surface of your cervix (Pap test).  For women ages 21-65, health care providers may recommend a pelvic exam and a Pap test every three years. For women ages 38-65, they may recommend the Pap test and pelvic exam, combined with testing for human papilloma virus (HPV), every five years. Some types of HPV increase your risk of cervical cancer. Testing for HPV may also be done on women of any age who have unclear Pap test results.  Other health care providers may not recommend any screening for nonpregnant women who are considered low risk for pelvic cancer and have no symptoms. Ask your health care provider if a screening pelvic exam is right for you.  If you have had past treatment for cervical cancer or a condition that could lead to cancer, you need Pap tests and screening for cancer for at least 20 years after your treatment. If Pap tests have been discontinued for you, your risk factors (such as having a new sexual partner) need to be reassessed to determine if you should start having screenings again. Some women have medical problems that increase the chance of  getting cervical cancer. In these cases, your health care provider may recommend that you have screening and Pap tests more often.  If you have a family history of uterine cancer or ovarian cancer, talk with your health care provider about genetic screening.  If you have vaginal bleeding after reaching menopause, tell your health care provider.  There are currently no reliable tests available to screen for ovarian cancer.  Lung Cancer Lung cancer screening is recommended for adults 54-47 years old who are at  high risk for lung cancer because of a history of smoking. A yearly low-dose CT scan of the lungs is recommended if you:  Currently smoke.  Have a history of at least 30 pack-years of smoking and you currently smoke or have quit within the past 15 years. A pack-year is smoking an average of one pack of cigarettes per day for one year.  Yearly screening should:  Continue until it has been 15 years since you quit.  Stop if you develop a health problem that would prevent you from having lung cancer treatment.  Colorectal Cancer  This type of cancer can be detected and can often be prevented.  Routine colorectal cancer screening usually begins at age 78 and continues through age 27.  If you have risk factors for colon cancer, your health care provider may recommend that you be screened at an earlier age.  If you have a family history of colorectal cancer, talk with your health care provider about genetic screening.  Your health care provider may also recommend using home test kits to check for hidden blood in your stool.  A small camera at the end of a tube can be used to examine your colon directly (sigmoidoscopy or colonoscopy). This is done to check for the earliest forms of colorectal cancer.  Direct examination of the colon should be repeated every 5-10 years until age 67. However, if early forms of precancerous polyps or small growths are found or if you have a family history or genetic risk for colorectal cancer, you may need to be screened more often.  Skin Cancer  Check your skin from head to toe regularly.  Monitor any moles. Be sure to tell your health care provider: ? About any new moles or changes in moles, especially if there is a change in a mole's shape or color. ? If you have a mole that is larger than the size of a pencil eraser.  If any of your family members has a history of skin cancer, especially at a young age, talk with your health care provider about genetic  screening.  Always use sunscreen. Apply sunscreen liberally and repeatedly throughout the day.  Whenever you are outside, protect yourself by wearing long sleeves, pants, a wide-brimmed hat, and sunglasses.  What should I know about osteoporosis? Osteoporosis is a condition in which bone destruction happens more quickly than new bone creation. After menopause, you may be at an increased risk for osteoporosis. To help prevent osteoporosis or the bone fractures that can happen because of osteoporosis, the following is recommended:  If you are 66-91 years old, get at least 1,000 mg of calcium and at least 600 mg of vitamin D per day.  If you are older than age 45 but younger than age 70, get at least 1,200 mg of calcium and at least 600 mg of vitamin D per day.  If you are older than age 29, get at least 1,200 mg of calcium and at least 800  mg of vitamin D per day.  Smoking and excessive alcohol intake increase the risk of osteoporosis. Eat foods that are rich in calcium and vitamin D, and do weight-bearing exercises several times each week as directed by your health care provider. What should I know about how menopause affects my mental health? Depression may occur at any age, but it is more common as you become older. Common symptoms of depression include:  Low or sad mood.  Changes in sleep patterns.  Changes in appetite or eating patterns.  Feeling an overall lack of motivation or enjoyment of activities that you previously enjoyed.  Frequent crying spells.  Talk with your health care provider if you think that you are experiencing depression. What should I know about immunizations? It is important that you get and maintain your immunizations. These include:  Tetanus, diphtheria, and pertussis (Tdap) booster vaccine.  Influenza every year before the flu season begins.  Pneumonia vaccine.  Shingles vaccine.  Your health care provider may also recommend other  immunizations. This information is not intended to replace advice given to you by your health care provider. Make sure you discuss any questions you have with your health care provider. Document Released: 05/21/2005 Document Revised: 10/17/2015 Document Reviewed: 12/31/2014 Elsevier Interactive Patient Education  2018 [employer].

## 2017-06-16 ENCOUNTER — Ambulatory Visit (INDEPENDENT_AMBULATORY_CARE_PROVIDER_SITE_OTHER): Payer: Federal, State, Local not specified - PPO | Admitting: Obstetrics & Gynecology

## 2017-06-16 ENCOUNTER — Encounter: Payer: Self-pay | Admitting: Obstetrics & Gynecology

## 2017-06-16 ENCOUNTER — Ambulatory Visit (INDEPENDENT_AMBULATORY_CARE_PROVIDER_SITE_OTHER): Payer: Federal, State, Local not specified - PPO

## 2017-06-16 VITALS — BP 130/80

## 2017-06-16 DIAGNOSIS — N83202 Unspecified ovarian cyst, left side: Secondary | ICD-10-CM

## 2017-06-16 NOTE — Progress Notes (Signed)
    Leslie Salazar 05-07-1965 [id number]        52 y.o.  G1P1001   RP: Pelvic ultrasound to follow-up on left ovarian cyst  HPI: Long-standing small left simple ovarian cyst stable for many years.  Recent Ca1 25- at 11.  No pelvic pain.   OB History  Gravida Para Term Preterm AB Living  1 1 1     1   SAB TAB Ectopic Multiple Live Births          1    # Outcome Date GA Lbr Len/2nd Weight Sex Delivery Anes PTL Lv  1 Term     M CS-Unspec  N LIV      Past medical history,surgical history, problem list, medications, allergies, family history and social history were all reviewed and documented in the EPIC chart.   Directed ROS with pertinent positives and negatives documented in the history of present illness/assessment and plan.  Exam:  Vitals:   06/16/17 1102  BP: 130/80   General appearance:  Normal  Pelvic US today: T/V images.  Anteverted uterus measuring 8.54 x 5.05 x 3.75 cm.  Right subserous fibroids measuring 0.9 x 0.9 cm with calcifications.  Right ovary normal.  Left ovary with a thin-walled echo-free cyst measuring 2.7 x 3 x 2.7 cm.  Negative color flow Doppler.  Small amount of free fluid around the right adnexa measuring 2 x 1.1 cm.  No free fluid in the posterior cul-de-sac.  Pelvic ultrasound in July 2018 was showing a similar left ovarian simple cyst measuring 2.5 cm.  Ca 125 normal at 11 on 05/25/2017   Assessment/Plan:  52 y.o. G1P1001   1. Left ovarian cyst Stable benign-appearing left ovarian cyst.  Ca125 normal in February 2019.  Asymptomatic.  Patient reassured.  Decision to continue annual pelvic ultrasound which we will combine with next annual gynecologic exam.  Counseling on above issues more than 50% for 15 minutes.  Princess Bruins MD, 11:19 AM 06/16/2017

## 2017-06-16 NOTE — Patient Instructions (Signed)
1. Left ovarian cyst Stable benign-appearing left ovarian cyst.  Ca125 normal in February 2019.  Asymptomatic.  Patient reassured.  Decision to continue annual pelvic ultrasound which we will combine with next annual gynecologic exam.  Aviela, good seeing you again today!

## 2017-06-17 DIAGNOSIS — J0100 Acute maxillary sinusitis, unspecified: Secondary | ICD-10-CM | POA: Diagnosis not present

## 2017-06-30 DIAGNOSIS — I10 Essential (primary) hypertension: Secondary | ICD-10-CM | POA: Diagnosis not present

## 2017-06-30 DIAGNOSIS — J309 Allergic rhinitis, unspecified: Secondary | ICD-10-CM | POA: Diagnosis not present

## 2017-06-30 DIAGNOSIS — E785 Hyperlipidemia, unspecified: Secondary | ICD-10-CM | POA: Diagnosis not present

## 2017-06-30 DIAGNOSIS — Z79899 Other long term (current) drug therapy: Secondary | ICD-10-CM | POA: Diagnosis not present

## 2017-07-16 DIAGNOSIS — J209 Acute bronchitis, unspecified: Secondary | ICD-10-CM | POA: Diagnosis not present

## 2017-08-08 IMAGING — MR MR BREAST BILAT W/O CM
6 of 12 series · 20 of 48 positions shown · non-contrast
Comparison: Previous exam(s).

CLINICAL DATA: Patient with history of bilateral mastectomies and
implant reconstruction.

EXAM:
BILATERAL BREAST MRI  WITHOUT CONTRAST
TECHNIQUE: Multiplanar, multisequence MR images of both breasts without
contrast. The exam was used to evaluate implant integrity. Because
of the sequences used and the lack of intravenous contrast, this
study is not diagnostic for breast lesions.

[Series 2: T2 fat-sat · axial · 3.0mm · 0.53mm/px · z∈[-118,+47]mm · 2 of 45 slices shown (1 of 2)]
[im 1/45]
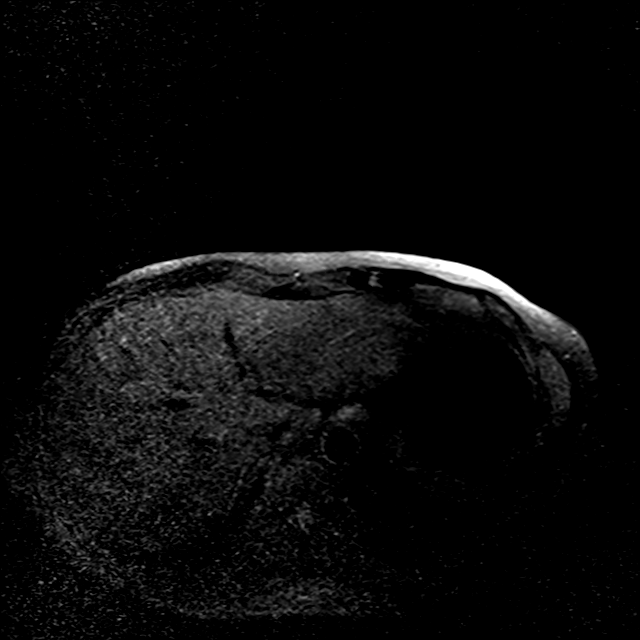
[im 45/45]
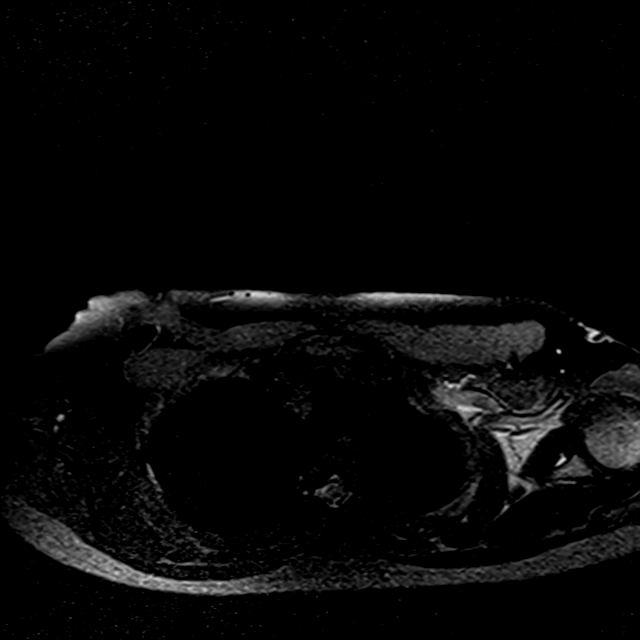

[Series 3: fl3d axial no · axial · 1.0mm · 0.74mm/px · z∈[-114,+44]mm · 11 of 160 slices shown (1 of 2)]
[im 1/160]
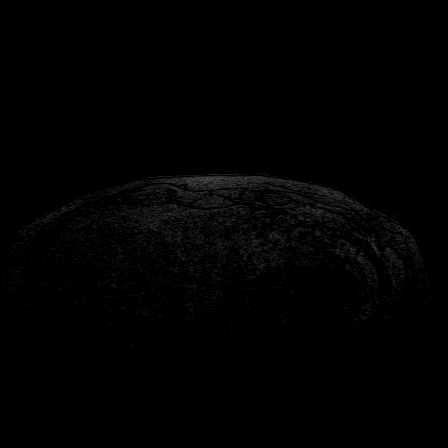
[im 16/160]
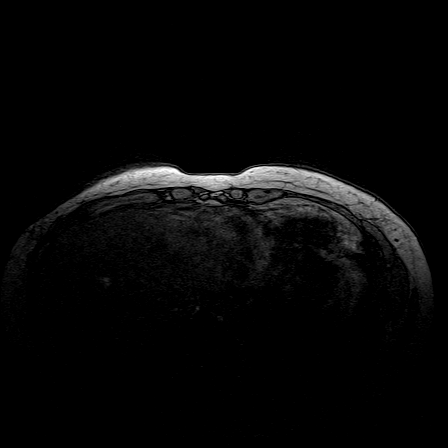
[im 32/160]
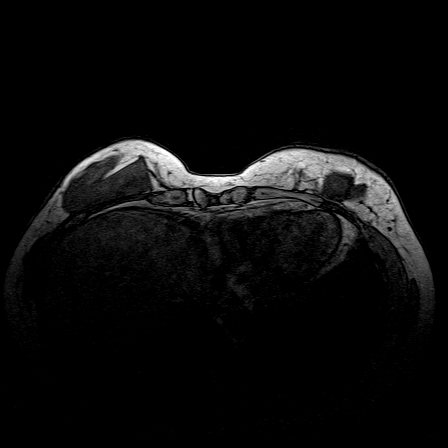
[im 48/160]
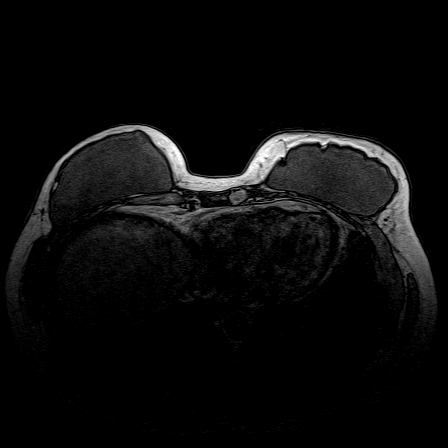
[im 64/160]
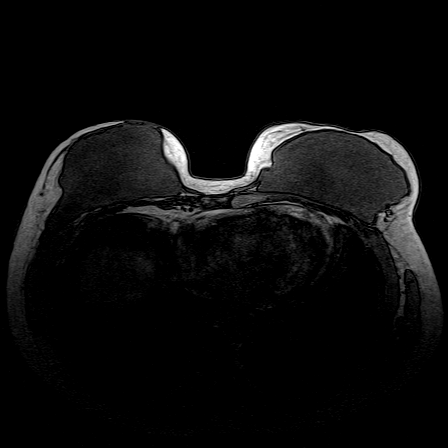
[im 80/160]
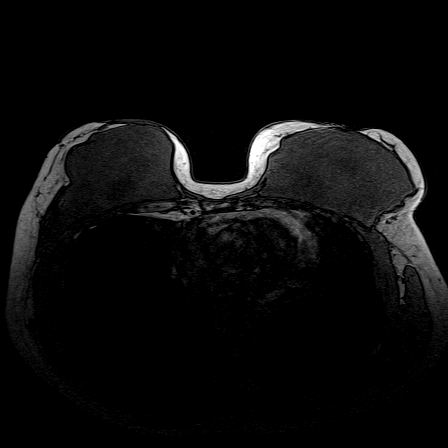
[im 96/160]
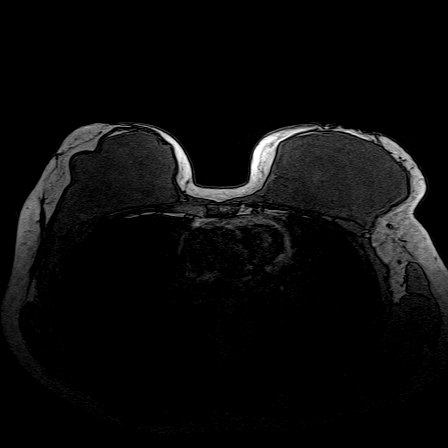
[im 112/160]
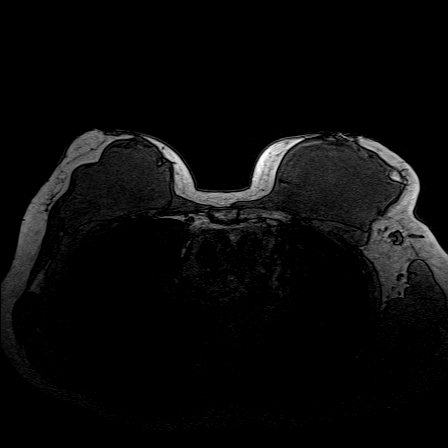
[im 128/160]
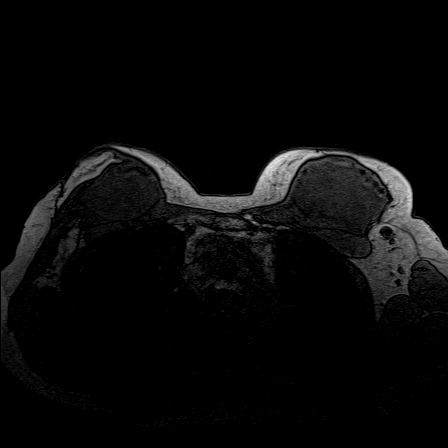
[im 144/160]
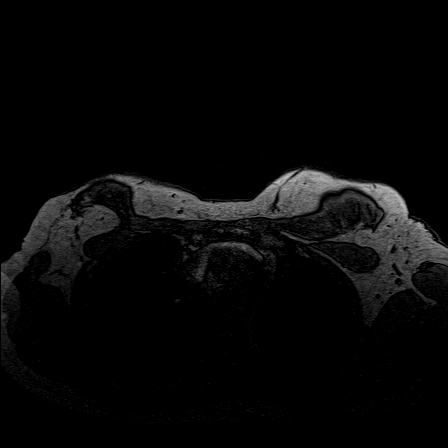
[im 160/160]
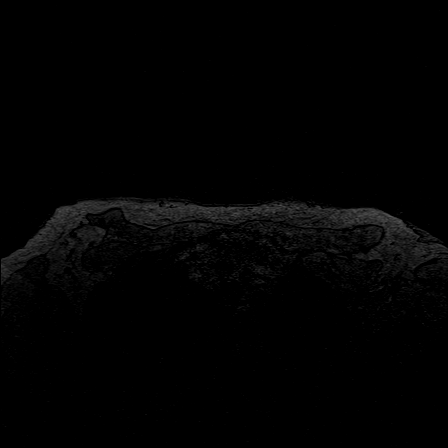

[Series 4: fl3d axial no · axial · 160.0mm · 0.74mm/px · 1 of 1 slices shown (2 of 2)]
[im 1/1]
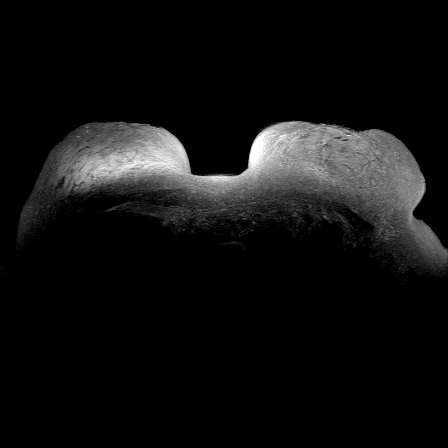

[Series 5: STIR · axial · 3.0mm · 0.64mm/px · z∈[-117,+47]mm · 3 of 45 slices shown (1 of 2)]
[im 1/45]
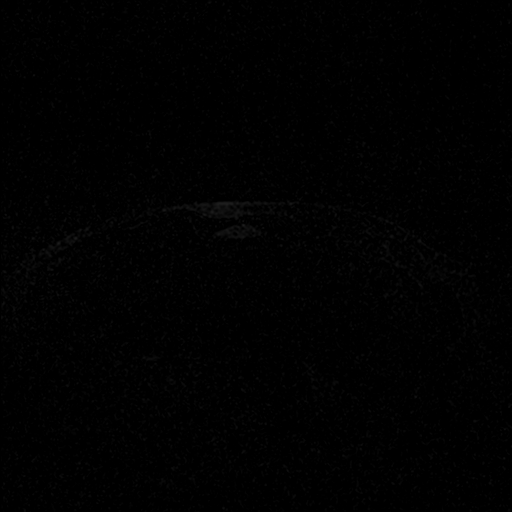
[im 23/45]
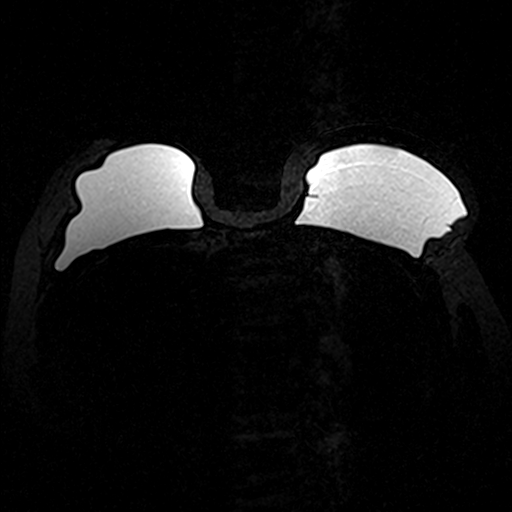
[im 45/45]
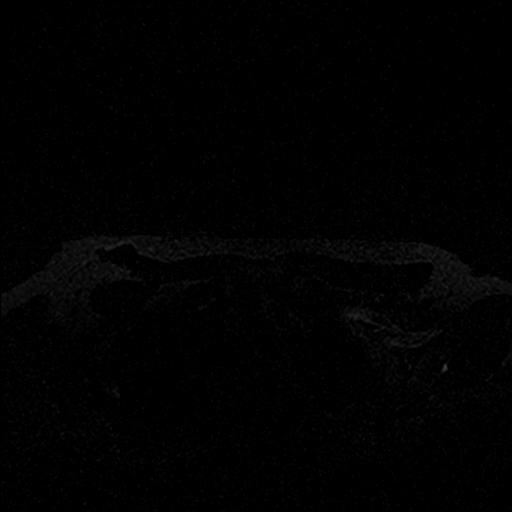

[Series 6: T2 fat-sat · sagittal · 3.0mm · 0.38mm/px · 2 of 33 slices shown (2 of 2)]
[im 1/33]
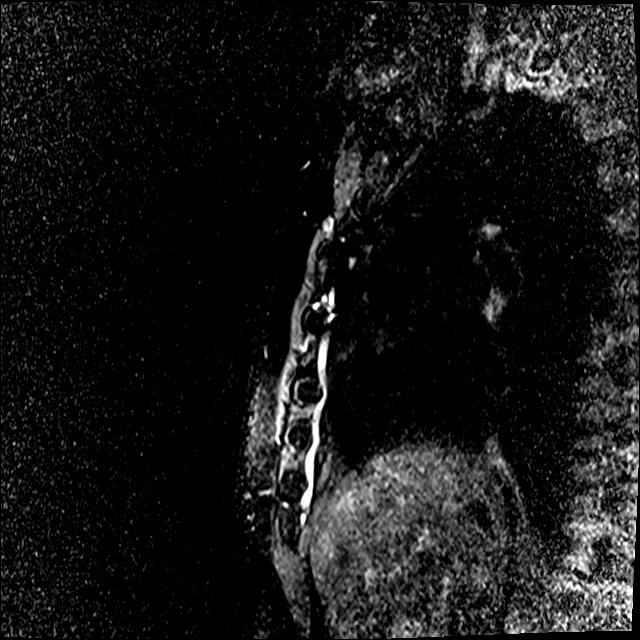
[im 33/33]
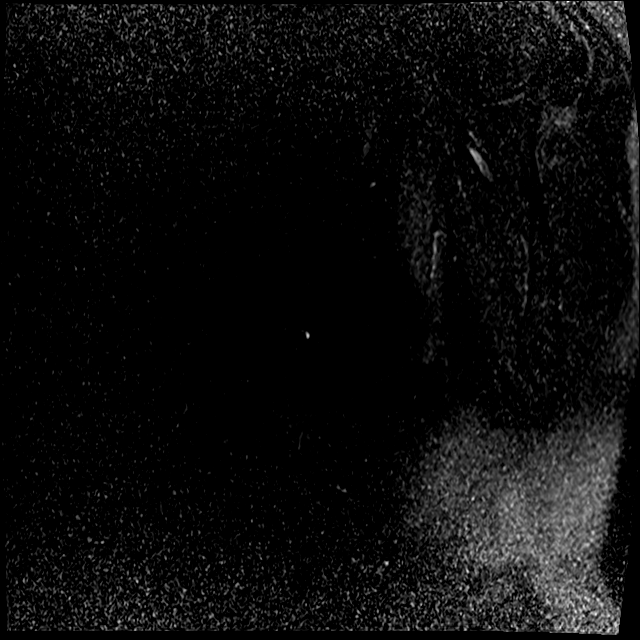

[Series 7: STIR · sagittal · 3.0mm · 0.47mm/px · 1 of 33 slices shown (2 of 2)]
[im 1/33]
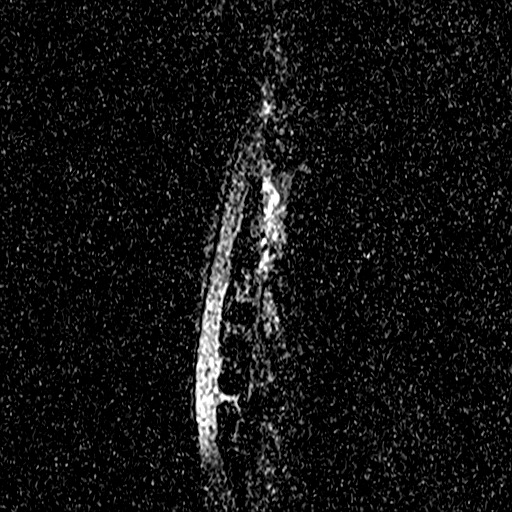

[20 of 48 positions shown; findings below may reference images not displayed]

THREE-DIMENSIONAL MR IMAGE RENDERING ON INDEPENDENT WORKSTATION:

Three-dimensional MR images were rendered by post-processing of the
original MR data on an independent workstation. The
three-dimensional MR images were interpreted, and findings are
reported in the following complete MRI report for this study. Three
dimensional images were evaluated at the independent [employer]
workstation.
FINDINGS: Breast composition: a.  Almost entirely fat.

Right breast: Status post mastectomy and implant reconstruction.
Silicone implant appears intact without evidence for intracapsular
or extracapsular rupture.

Left breast: Status post mastectomy and implant reconstruction.
Silicone implant appears intact without evidence for intracapsular
or extracapsular rupture.

Ancillary findings: None.
IMPRESSION: Patient status post bilateral mastectomies and implant
reconstruction without evidence for intracapsular or extracapsular
rupture.

RECOMMENDATION:
Continued clinical evaluation.

## 2017-09-08 DIAGNOSIS — K080 Exfoliation of teeth due to systemic causes: Secondary | ICD-10-CM | POA: Diagnosis not present

## 2017-09-14 DIAGNOSIS — Z170 Estrogen receptor positive status [ER+]: Secondary | ICD-10-CM | POA: Diagnosis not present

## 2017-09-14 DIAGNOSIS — C50111 Malignant neoplasm of central portion of right female breast: Secondary | ICD-10-CM | POA: Diagnosis not present

## 2017-09-14 DIAGNOSIS — Z9189 Other specified personal risk factors, not elsewhere classified: Secondary | ICD-10-CM | POA: Diagnosis not present

## 2017-09-14 DIAGNOSIS — Z5181 Encounter for therapeutic drug level monitoring: Secondary | ICD-10-CM | POA: Diagnosis not present

## 2017-09-14 DIAGNOSIS — I10 Essential (primary) hypertension: Secondary | ICD-10-CM | POA: Diagnosis not present

## 2017-09-14 DIAGNOSIS — Z79810 Long term (current) use of selective estrogen receptor modulators (SERMs): Secondary | ICD-10-CM | POA: Diagnosis not present

## 2018-01-18 ENCOUNTER — Other Ambulatory Visit: Payer: Self-pay | Admitting: Internal Medicine

## 2018-01-18 DIAGNOSIS — J0121 Acute recurrent ethmoidal sinusitis: Secondary | ICD-10-CM | POA: Diagnosis not present

## 2018-02-08 DIAGNOSIS — I340 Nonrheumatic mitral (valve) insufficiency: Secondary | ICD-10-CM | POA: Diagnosis not present

## 2018-02-16 ENCOUNTER — Ambulatory Visit (INDEPENDENT_AMBULATORY_CARE_PROVIDER_SITE_OTHER): Payer: Federal, State, Local not specified - PPO | Admitting: Internal Medicine

## 2018-02-16 ENCOUNTER — Encounter: Payer: Self-pay | Admitting: Internal Medicine

## 2018-02-16 VITALS — BP 112/62 | HR 57 | Temp 98.3°F | Ht 62.0 in | Wt 144.0 lb

## 2018-02-16 DIAGNOSIS — N952 Postmenopausal atrophic vaginitis: Secondary | ICD-10-CM

## 2018-02-16 DIAGNOSIS — Z1211 Encounter for screening for malignant neoplasm of colon: Secondary | ICD-10-CM | POA: Diagnosis not present

## 2018-02-16 DIAGNOSIS — Z853 Personal history of malignant neoplasm of breast: Secondary | ICD-10-CM | POA: Diagnosis not present

## 2018-02-16 DIAGNOSIS — I10 Essential (primary) hypertension: Secondary | ICD-10-CM

## 2018-02-16 DIAGNOSIS — Z0000 Encounter for general adult medical examination without abnormal findings: Secondary | ICD-10-CM | POA: Diagnosis not present

## 2018-02-16 LAB — POCT URINALYSIS DIPSTICK
Bilirubin, UA: NEGATIVE
Blood, UA: NEGATIVE
GLUCOSE UA: NEGATIVE
KETONES UA: NEGATIVE
Leukocytes, UA: NEGATIVE
Nitrite, UA: NEGATIVE
Protein, UA: POSITIVE — AB
SPEC GRAV UA: 1.02 (ref 1.010–1.025)
Urobilinogen, UA: 1 E.U./dL
pH, UA: 6.5 (ref 5.0–8.0)

## 2018-02-16 LAB — POCT UA - MICROALBUMIN
Creatinine, POC: 300 mg/dL
Microalbumin Ur, POC: 30 mg/L

## 2018-02-16 NOTE — Patient Instructions (Signed)
Atrophic Vaginitis Atrophic vaginitis is when the tissues that line the vagina become dry and thin. This is caused by a drop in estrogen. Estrogen helps:  To keep the vagina moist.  To make a clear fluid that helps: ? To lubricate the vagina for sex. ? To protect the vagina from infection.  If the lining of the vagina is dry and thin, it may:  Make sex painful. It may also cause bleeding.  Cause a feeling of: ? Burning. ? Irritation. ? Itchiness.  Make an exam of your vagina painful. It may also cause bleeding.  Make you lose interest in sex.  Cause a burning feeling when you pee.  Make your vaginal fluid (discharge) brown or yellow.  For some women, there are no symptoms. This condition is most common in women who do not get their regular menstrual periods anymore (menopause). This often starts when a woman is 45-55 years old. Follow these instructions at home:  Take medicines only as told by your doctor. Do not use any herbal or alternative medicines unless your doctor says it is okay.  Use over-the-counter products for dryness only as told by your doctor. These include: ? Creams. ? Lubricants. ? Moisturizers.  Do not douche.  Do not use products that can make your vagina dry. These include: ? Scented feminine sprays. ? Scented tampons. ? Scented soaps.  If it hurts to have sex, tell your sexual partner. Contact a doctor if:  Your discharge looks different than normal.  Your vagina has an unusual smell.  You have new symptoms.  Your symptoms do not get better with treatment.  Your symptoms get worse. This information is not intended to replace advice given to you by your health care provider. Make sure you discuss any questions you have with your health care provider. Document Released: 09/15/2007 Document Revised: 09/04/2015 Document Reviewed: 03/20/2014 Elsevier Interactive Patient Education  2018 Elsevier Inc.  

## 2018-02-16 NOTE — Progress Notes (Signed)
Subjective:     Patient ID: Leslie Salazar , female    DOB: 27-Nov-1965 , 52 y.o.   MRN: [id number]   Chief Complaint  Patient presents with  . Annual Exam  . Hypertension    HPI  She is here today for a full physical exam.  She is followed by Gyn for her pelvic examinations. She is up to date with this. She has no specific concens or complaints at this time.   Hypertension  This is a chronic problem. The problem has been rapidly improving since onset. The problem is controlled. Pertinent negatives include no blurred vision, chest pain, headaches, neck pain, orthopnea, palpitations or shortness of breath.      No LMP recorded. Patient is postmenopausal.. Negative for: breast discharge, breast lump(s), breast pain and breast self exam. Associated symptoms include abnormal vaginal bleeding. Pertinent negatives include abnormal bleeding (hematology), anxiety, decreased libido, depression, difficulty falling sleep, dyspareunia, history of infertility, nocturia, sexual dysfunction, sleep disturbances, urinary incontinence, urinary urgency, vaginal discharge and vaginal itching. Diet regular.The patient states her exercise level is    . The patient's tobacco use is:  Social History   Tobacco Use  Smoking Status Never Smoker  Smokeless Tobacco Never Used  . She has been exposed to passive smoke. The patient's alcohol use is:  Social History   Substance and Sexual Activity  Alcohol Use Yes  . Alcohol/week: 1.0 standard drinks  . Types: 1 Glasses of wine per week  . Additional information: Last pap Spring 2019, next one scheduled for Spring 2020.   Past Medical History:  Diagnosis Date  . Acne   . Cancer ([place]) 2012   RIGHT DUCTAL CARCINOMA INSITU..  . Complication of anesthesia   . Condyloma    ON BUTTOCKS  . 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 High cholesterol   . HSV-1 (herpes simplex virus 1) infection   . Hx of radiation therapy 2013   from april to may - six weeks  . Hypertension   . PONV (postoperative nausea and vomiting)    nausea no vomiting  . Scoliosis   . Wears glasses      Family History  Problem Relation Age of Onset  . Colon cancer Paternal Aunt   . Diabetes Mother        HAS HAD A KIDNEY TRANSPLATN  . Hypertension Mother   . Kidney failure Mother   . Hypertension Father   . Gallstones Father   . Gallstones Sister      Current Outpatient Medications:  .  atorvastatin (LIPITOR) 20 MG tablet, Take 20 mg by mouth daily. Reported on 05/22/2015, Disp: , Rfl:  .  Calcium Citrate-Vitamin D (CITRACAL + D PO), Take 1 tablet by mouth 2 (two) times daily. 1276m Calcium-1000iu Vitamin D, Disp: , Rfl:  .  Cholecalciferol (VITAMIN D-1000 MAX ST) 1000 units tablet, Take 1 tablet by mouth daily., Disp: , Rfl:  .  Coenzyme Q10 100 MG capsule, Take 1 capsule by mouth daily., Disp: , Rfl:  .  Diphenhydramine-Pseudoephed (ALLERGY DECONGESTANT PO), Take by mouth., Disp: , Rfl:  .  Estradiol 10 MCG TABS vaginal tablet, Place 1 tablet (10 mcg total) vaginally 2 (two) times a week., Disp: 24 tablet, Rfl: 4 .  hydrochlorothiazide (MICROZIDE) 12.5 MG capsule, TAKE ONE CAPSULE BY MOUTH EVERY DAY, Disp: 90 capsule, Rfl: 0 .  hydrochlorothiazide 25 MG tablet, Take 12.5 mg by mouth daily. ,  Disp: , Rfl:  .  hydrocortisone (ANUSOL-HC) 2.5 % rectal cream, Place 1 application rectally 2 (two) times daily., Disp: 30 g, Rfl: 3 .  metoprolol succinate (TOPROL-XL) 25 MG 24 hr tablet, Take 25 mg by mouth daily., Disp: , Rfl:  .  nitroGLYCERIN (NITROSTAT) 0.4 MG SL tablet, Place 0.4 tablets under the tongue every 5 (five) minutes x 3 doses as needed., Disp: , Rfl: 1 .  Omega-3 Fatty Acids (FISH OIL PO), Take by mouth., Disp: , Rfl:  .  POTASSIUM GLUCONATE PO, Take by mouth., Disp: , Rfl:  .  valACYclovir (VALTREX) 500 MG tablet, Take 1 tablet (500 mg total) by mouth daily., Disp: 90 tablet, Rfl: 4   No Known Allergies   Review of Systems   Constitutional: Negative.   HENT: Negative.   Eyes: Negative.  Negative for blurred vision.  Respiratory: Negative.  Negative for shortness of breath.   Cardiovascular: Negative.  Negative for chest pain, palpitations and orthopnea.  Endocrine: Negative.   Genitourinary: Positive for vaginal pain (she c/o vaginal dryness. estrogen suppository prescribed by gyn. she is hesitant to use this given h/o breast ca.).  Musculoskeletal: Negative.  Negative for neck pain.  Skin: Negative.   Allergic/Immunologic: Negative.   Neurological: Negative.  Negative for headaches.  Hematological: Negative.   Psychiatric/Behavioral: Negative.      Today's Vitals   02/16/18 0935  BP: 112/62  Pulse: (!) 57  Temp: 98.3 F (36.8 C)  TempSrc: Oral  Weight: 144 lb (65.3 kg)  Height: '5\' 2"'$  (1.575 m)   Body mass index is 26.34 kg/m.   Objective:  Physical Exam  Constitutional: She is oriented to person, place, and time. She appears well-developed and well-nourished.  HENT:  Head: Normocephalic and atraumatic.  Right Ear: External ear normal.  Left Ear: External ear normal.  Nose: Nose normal.  Mouth/Throat: Oropharynx is clear and moist.  Eyes: Pupils are equal, round, and reactive to light. Conjunctivae and EOM are normal.  Neck: Normal range of motion. Neck supple.  Cardiovascular: Normal rate, regular rhythm, normal heart sounds and intact distal pulses.  Pulmonary/Chest: Effort normal and breath sounds normal.  Bilateral breast exam performed. She is s/p mastectomy and reconstructive surgery. Implants b/l.  No masses appreciated.  Abdominal: Soft. Bowel sounds are normal.  Genitourinary:  Genitourinary Comments: deferred  Musculoskeletal: Normal range of motion.  Neurological: She is alert and oriented to person, place, and time.  Skin: Skin is warm and dry.  Psychiatric: She has a normal mood and affect. Her behavior is normal. Judgment and thought content normal.  Nursing note and  vitals reviewed.       Assessment And Plan:     1. Routine general medical examination at health care facility  A full exam was performed. Importance of monthly self breast exams was discussed with the patient.  PATIENT HAS BEEN ADVISED TO GET 30-45 MINUTES REGULAR EXERCISE NO LESS THAN FOUR TO FIVE DAYS PER WEEK - BOTH WEIGHTBEARING EXERCISES AND AEROBIC ARE RECOMMENDED.  SHE IS ADVISED TO FOLLOW A HEALTHY DIET WITH AT LEAST SIX FRUITS/VEGGIES PER DAY, DECREASE INTAKE OF RED MEAT, AND TO INCREASE FISH INTAKE TO TWO DAYS PER WEEK.  MEATS/FISH SHOULD NOT BE FRIED, BAKED OR BROILED IS PREFERABLE.  I SUGGEST WEARING SPF 50 SUNSCREEN ON EXPOSED PARTS AND ESPECIALLY WHEN IN THE DIRECT SUNLIGHT FOR AN EXTENDED PERIOD OF TIME.  PLEASE AVOID FAST FOOD RESTAURANTS AND INCREASE YOUR WATER INTAKE.  - CMP14+EGFR - CBC - Lipid  panel - Hemoglobin A1c - TSH  2. Essential hypertension, benign  Well controlled. She will continue with current meds. She is encouraged to limit her salt intake. EKG performed, no acute changes noted. She will rto in six months for re-evaluation.   3. Postmenopausal atrophic vaginitis  I will call in vitamin E suppositories into [facility]. She is hesitant to use Estrogen suppository prescribed by her GYN. She is encouraged to use vitamin E suppositories nightly x 7 days, then 2-3 days per week as needed. She will let me know if her sx persist.   4. Colon cancer screening  Her last colonoscopy was performed in 2013. Results reviewed in full detail during this visit. I will refer her to GI for f/u colonoscopy as suggested. She was due for f/u procedure in 5 years. She is in agreement with her treatment plan.   Maximino Greenland, MD

## 2018-02-17 LAB — CMP14+EGFR
ALT: 29 IU/L (ref 0–32)
AST: 24 IU/L (ref 0–40)
Albumin/Globulin Ratio: 2.1 (ref 1.2–2.2)
Albumin: 4.7 g/dL (ref 3.5–5.5)
Alkaline Phosphatase: 67 IU/L (ref 39–117)
BUN/Creatinine Ratio: 22 (ref 9–23)
BUN: 17 mg/dL (ref 6–24)
Bilirubin Total: 1 mg/dL (ref 0.0–1.2)
CALCIUM: 9.9 mg/dL (ref 8.7–10.2)
CHLORIDE: 105 mmol/L (ref 96–106)
CO2: 21 mmol/L (ref 20–29)
Creatinine, Ser: 0.78 mg/dL (ref 0.57–1.00)
GFR, EST AFRICAN AMERICAN: 102 mL/min/{1.73_m2} (ref >59)
GFR, EST NON AFRICAN AMERICAN: 88 mL/min/{1.73_m2} (ref >59)
GLUCOSE: 79 mg/dL (ref 65–99)
Globulin, Total: 2.2 g/dL (ref 1.5–4.5)
Potassium: 4 mmol/L (ref 3.5–5.2)
Sodium: 145 mmol/L — ABNORMAL HIGH (ref 134–144)
TOTAL PROTEIN: 6.9 g/dL (ref 6.0–8.5)

## 2018-02-17 LAB — CBC
HEMATOCRIT: 39.9 % (ref 34.0–46.6)
Hemoglobin: 14 g/dL (ref 11.1–15.9)
MCH: 31.3 pg (ref 26.6–33.0)
MCHC: 35.1 g/dL (ref 31.5–35.7)
MCV: 89 fL (ref 79–97)
Platelets: 218 10*3/uL (ref 150–450)
RBC: 4.47 x10E6/uL (ref 3.77–5.28)
RDW: 12.7 % (ref 12.3–15.4)
WBC: 5.5 10*3/uL (ref 3.4–10.8)

## 2018-02-17 LAB — LIPID PANEL
CHOL/HDL RATIO: 2 ratio (ref 0.0–4.4)
Cholesterol, Total: 142 mg/dL (ref 100–199)
HDL: 71 mg/dL (ref >39)
LDL Calculated: 60 mg/dL (ref 0–99)
TRIGLYCERIDES: 54 mg/dL (ref 0–149)
VLDL CHOLESTEROL CAL: 11 mg/dL (ref 5–40)

## 2018-02-17 LAB — HEMOGLOBIN A1C
Est. average glucose Bld gHb Est-mCnc: 108 mg/dL
Hgb A1c MFr Bld: 5.4 % (ref 4.8–5.6)

## 2018-02-17 LAB — TSH: TSH: 1.36 u[IU]/mL (ref 0.450–4.500)

## 2018-02-19 ENCOUNTER — Encounter: Payer: Self-pay | Admitting: Internal Medicine

## 2018-02-19 NOTE — Progress Notes (Signed)
Hello,   Your sodium level is slightly elevated - this implies you are slightly dehydrated. Please be sure to stay well hydrated. Your liver and kidney function are normal. Your blood count is normal. Your cholesterol looks great.  You are not prediabetic.    Lastly, your thyroid function is normal. Please let me know if you have any questions. May you have a great week! Thank you for your continued support!  Sincerely,    Dontee Jaso N. [facility], MD

## 2018-02-21 DIAGNOSIS — E7800 Pure hypercholesterolemia, unspecified: Secondary | ICD-10-CM | POA: Diagnosis not present

## 2018-02-21 DIAGNOSIS — I340 Nonrheumatic mitral (valve) insufficiency: Secondary | ICD-10-CM | POA: Diagnosis not present

## 2018-02-21 DIAGNOSIS — I119 Hypertensive heart disease without heart failure: Secondary | ICD-10-CM | POA: Diagnosis not present

## 2018-03-17 DIAGNOSIS — K080 Exfoliation of teeth due to systemic causes: Secondary | ICD-10-CM | POA: Diagnosis not present

## 2018-03-28 DIAGNOSIS — B349 Viral infection, unspecified: Secondary | ICD-10-CM | POA: Diagnosis not present

## 2018-04-02 ENCOUNTER — Other Ambulatory Visit: Payer: Self-pay | Admitting: Internal Medicine

## 2018-04-04 ENCOUNTER — Encounter: Payer: Self-pay | Admitting: Internal Medicine

## 2018-04-18 DIAGNOSIS — Z9189 Other specified personal risk factors, not elsewhere classified: Secondary | ICD-10-CM | POA: Diagnosis not present

## 2018-04-18 DIAGNOSIS — Z170 Estrogen receptor positive status [ER+]: Secondary | ICD-10-CM | POA: Diagnosis not present

## 2018-04-18 DIAGNOSIS — C50111 Malignant neoplasm of central portion of right female breast: Secondary | ICD-10-CM | POA: Diagnosis not present

## 2018-04-18 DIAGNOSIS — I10 Essential (primary) hypertension: Secondary | ICD-10-CM | POA: Diagnosis not present

## 2018-05-24 ENCOUNTER — Other Ambulatory Visit: Payer: Self-pay | Admitting: Obstetrics & Gynecology

## 2018-05-24 DIAGNOSIS — N83202 Unspecified ovarian cyst, left side: Secondary | ICD-10-CM

## 2018-05-26 ENCOUNTER — Encounter: Payer: Self-pay | Admitting: Obstetrics & Gynecology

## 2018-05-26 ENCOUNTER — Ambulatory Visit (INDEPENDENT_AMBULATORY_CARE_PROVIDER_SITE_OTHER): Payer: Federal, State, Local not specified - PPO | Admitting: Obstetrics & Gynecology

## 2018-05-26 VITALS — BP 128/76 | Ht 62.0 in | Wt 145.0 lb

## 2018-05-26 DIAGNOSIS — Z170 Estrogen receptor positive status [ER+]: Secondary | ICD-10-CM

## 2018-05-26 DIAGNOSIS — Z853 Personal history of malignant neoplasm of breast: Secondary | ICD-10-CM | POA: Diagnosis not present

## 2018-05-26 DIAGNOSIS — Z780 Asymptomatic menopausal state: Secondary | ICD-10-CM | POA: Diagnosis not present

## 2018-05-26 DIAGNOSIS — C50911 Malignant neoplasm of unspecified site of right female breast: Secondary | ICD-10-CM

## 2018-05-26 DIAGNOSIS — Z8619 Personal history of other infectious and parasitic diseases: Secondary | ICD-10-CM | POA: Diagnosis not present

## 2018-05-26 DIAGNOSIS — Z01419 Encounter for gynecological examination (general) (routine) without abnormal findings: Secondary | ICD-10-CM | POA: Diagnosis not present

## 2018-05-26 NOTE — Progress Notes (Addendum)
Leslie Salazar [facility] September 23, 1965 [id number]   History:    53 y.o. G1P1L1  Married  RP:  Established patient presenting for annual gyn exam   HPI:  Menopause, well on no HRT, no PMB.  No pelvic pain.  Rt Breast Infiltrating Ductal Ca, ER-PR positive.  Finished Tamoxifen x 6 months.  BrCa1-2 negative.  Using Vit E in vagina to help with IC.  BMI 26.52.  Gym with trainer 3 times a week.  Health labs with Fam MD.   Past medical history,surgical history, family history and social history were all reviewed and documented in the EPIC chart.  Gynecologic History No LMP recorded. Patient is postmenopausal. Contraception: post menopausal status Last Pap: 05/2017. Results were: Negative Last mammogram: S/P Bilateral Mastectomy, reconstruction Bone Density: 05/2016 Normal.  Repeat at 6 yrs Colonoscopy: 2017 on a 5 yr schedule  Obstetric History OB History  Gravida Para Term Preterm AB Living  _0 SAB TAB Ectopic Multiple Live Births          1    # Outcome Date GA Lbr Len/2nd Weight Sex Delivery Anes PTL Lv  1 Term     M CS-Unspec  N LIV     ROS: A ROS was performed and pertinent positives and negatives are included in the history.  GENERAL: No fevers or chills. HEENT: No change in vision, no earache, sore throat or sinus congestion. NECK: No pain or stiffness. CARDIOVASCULAR: No chest pain or pressure. No palpitations. PULMONARY: No shortness of breath, cough or wheeze. GASTROINTESTINAL: No abdominal pain, nausea, vomiting or diarrhea, melena or bright red blood per rectum. GENITOURINARY: No urinary frequency, urgency, hesitancy or dysuria. MUSCULOSKELETAL: No joint or muscle pain, no back pain, no recent trauma. DERMATOLOGIC: No rash, no itching, no lesions. ENDOCRINE: No polyuria, polydipsia, no heat or cold intolerance. No recent change in weight. HEMATOLOGICAL: No anemia or easy bruising or bleeding. NEUROLOGIC: No headache, seizures, numbness, tingling or weakness. PSYCHIATRIC: No  depression, no loss of interest in normal activity or change in sleep pattern.     Exam:   BP 128/76   Ht 5' 2" (1.575 m)   Wt 145 lb (65.8 kg)   BMI 26.52 kg/m   Body mass index is 26.52 kg/m.  General appearance : Well developed well nourished female. No acute distress HEENT: Eyes: no retinal hemorrhage or exudates,  Neck supple, trachea midline, no carotid bruits, no thyroidmegaly Lungs: Clear to auscultation, no rhonchi or wheezes, or rib retractions  Heart: Regular rate and rhythm, no murmurs or gallops Breast:Examined in sitting and supine position were symmetrical in appearance, no palpable masses or tenderness,  no skin retraction, no nipple inversion, no nipple discharge, no skin discoloration, no axillary or supraclavicular lymphadenopathy Abdomen: no palpable masses or tenderness, no rebound or guarding Extremities: no edema or skin discoloration or tenderness  Pelvic: Vulva: Normal             Vagina: No gross lesions or discharge  Cervix: No gross lesions or discharge.  Pap reflex done  Uterus  AV, normal size, shape and consistency, non-tender and mobile  Adnexa  Without masses or tenderness  Anus: Normal   Assessment/Plan:  53 y.o. female for annual exam   1. Encounter for routine gynecological examination with Papanicolaou smear of cervix Normal gynecologic exam in menopause.  Pap reflex done.  Status post bilateral mastectomy for right breast cancer.  Colonoscopy in 2017.  Health labs with  family physician.  2. Postmenopausal Well on no hormone replacement therapy.  No postmenopausal bleeding.  Bone density normal in February 2018.  Will repeat bone density at 5 years.  Vitamin D supplements, calcium intake of 1500 mg daily including nutritional and supplemental, regular weightbearing physical activities recommended.  3. Infiltrating ductal carcinoma of right breast, estrogen receptor positive, stage 1 ([place]) Status post bilateral mastectomy.  Finished  tamoxifen last year.  4. H/O herpes genitalis Well on Valacyclovir prophylaxis.  Represcribed.  Other orders - valACYclovir (VALTREX) 500 MG tablet; Take 1 tablet (500 mg total) by mouth daily.  Princess Bruins MD, 11:18 AM 05/26/2018

## 2018-05-26 NOTE — Patient Instructions (Signed)
1. Encounter for routine gynecological examination with Papanicolaou smear of cervix Normal gynecologic exam in menopause.  Pap reflex done.  Status post bilateral mastectomy for right breast cancer.  Colonoscopy in 2017.  Health labs with family physician.  2. Postmenopausal Well on no hormone replacement therapy.  No postmenopausal bleeding.  Bone density normal in February 2018.  Will repeat bone density at 5 years.  Vitamin D supplements, calcium intake of 1500 mg daily including nutritional and supplemental, regular weightbearing physical activities recommended.  3. Infiltrating ductal carcinoma of right breast, estrogen receptor positive, stage 1 ([place]) Status post bilateral mastectomy.  Finished tamoxifen last year.  [name], it was a pleasure seeing you today!  I will inform you of your results as soon as they are available.

## 2018-05-29 ENCOUNTER — Ambulatory Visit (INDEPENDENT_AMBULATORY_CARE_PROVIDER_SITE_OTHER): Payer: Federal, State, Local not specified - PPO

## 2018-05-29 ENCOUNTER — Ambulatory Visit (INDEPENDENT_AMBULATORY_CARE_PROVIDER_SITE_OTHER): Payer: Federal, State, Local not specified - PPO | Admitting: Obstetrics & Gynecology

## 2018-05-29 ENCOUNTER — Encounter: Payer: Self-pay | Admitting: Obstetrics & Gynecology

## 2018-05-29 VITALS — BP 128/80

## 2018-05-29 DIAGNOSIS — N83292 Other ovarian cyst, left side: Secondary | ICD-10-CM

## 2018-05-29 DIAGNOSIS — Z780 Asymptomatic menopausal state: Secondary | ICD-10-CM | POA: Diagnosis not present

## 2018-05-29 DIAGNOSIS — C50911 Malignant neoplasm of unspecified site of right female breast: Secondary | ICD-10-CM

## 2018-05-29 DIAGNOSIS — N83202 Unspecified ovarian cyst, left side: Secondary | ICD-10-CM

## 2018-05-29 DIAGNOSIS — Z853 Personal history of malignant neoplasm of breast: Secondary | ICD-10-CM | POA: Diagnosis not present

## 2018-05-29 MED ORDER — VALACYCLOVIR HCL 500 MG PO TABS: 500.0000 mg | ORAL_TABLET | Freq: Every day | ORAL | 4 refills | Status: DC

## 2018-05-29 NOTE — Addendum Note (Signed)
Addended by: Princess Bruins on: 05/29/2018 10:58 AM   Modules accepted: Orders

## 2018-05-29 NOTE — Progress Notes (Signed)
    Leslie Salazar June 29, 1965 [id number]        53 y.o.  G1P1001   RP: F/U Left ovarian cyst for Pelvic US  HPI:  Menopause, well on no HRT, no PMB.  No pelvic pain.  Rt Breast Infiltrating Ductal Ca, ER-PR positive.  Finished Tamoxifen x 6 months.  BrCa1-2 negative.  Last Pelvic US 06/16/2017 Left simple ovarian cyst 3 x 2.7 x 2.7 cm.  Ca 125 05/25/2017 was normal at 11.   OB History  Gravida Para Term Preterm AB Living  _0 SAB TAB Ectopic Multiple Live Births          1    # Outcome Date GA Lbr Len/2nd Weight Sex Delivery Anes PTL Lv  1 Term     M CS-Unspec  N LIV    Past medical history,surgical history, problem list, medications, allergies, family history and social history were all reviewed and documented in the EPIC chart.   Directed ROS with pertinent positives and negatives documented in the history of present illness/assessment and plan.  Exam:  There were no vitals filed for this visit. General appearance:  Normal  Pelvic US today: T/V images.  Uterus anteverted heterogeneous measuring 7.33 x 4.77 x 3.21 cm.  Intramural fibroid measured at 1.5 cm with calcifications and subserous left at 2.1 x 1.6 cm.  Endometrial lining is thin at 2.3 mm.  Right ovary normal.  Left ovary with a rim of tissue with normal color flow Doppler.  Thin-walled echo-free cystic mass on the left ovary measuring 3.4 x 3.1 x 2.9 cm, mean diameter at 3.1 cm, negative color flow Doppler.  Echogenic focus in the wall of the cyst measuring 4 mm with negative color flow Doppler.  Small cyst adjacent to the larger cyst measuring 9 x 8 mm.  No free fluid in the posterior cul-de-sac.   Assessment/Plan:  53 y.o. G1P1001   1. Complex cyst of left ovary Pelvic US findings thoroughly reviewed with patient.  Given the changes in the left ovarian cyst including a small increase in size at 3.4 x 3.1 x 2.1 cm now and also the echogenic focus on the wall of the cyst measuring 4 mm and a small cyst  adjacent to the larger cyst measuring 9 x 8 mm, as well as the history of infiltrating ductal carcinoma of the right breast with estrogen receptor positive status, decision to proceed with LPS Peritoneal washings/BSO.  A Ca1 25 was repeated today.  Information given on the surgical procedure.  Patient's questions answered.  We will follow-up for a preop visit to discuss the preop management, the procedure risks and benefits, as well as the postop management more thoroughly. - CA 125  2. Infiltrating ductal carcinoma of right breast, stage 1 ([facility]) ER positive.  Finished Tamoxifen 6 months ago.  BrCa 1-2 negative.  3. Postmenopausal Well on no hormone replacement therapy with no postmenopausal bleeding.  Counseling on above issues >50% x 25 minutes.  Princess Bruins MD, 10:25 AM 05/29/2018

## 2018-05-30 ENCOUNTER — Encounter: Payer: Self-pay | Admitting: Obstetrics & Gynecology

## 2018-05-30 LAB — PAP IG W/ RFLX HPV ASCU

## 2018-05-30 LAB — CA 125: CA 125: 8 U/mL (ref <35)

## 2018-05-30 NOTE — Patient Instructions (Signed)
1. Complex cyst of left ovary Pelvic US findings thoroughly reviewed with patient.  Given the changes in the left ovarian cyst including a small increase in size at 3.4 x 3.1 x 2.1 cm now and also the echogenic focus on the wall of the cyst measuring 4 mm and a small cyst adjacent to the larger cyst measuring 9 x 8 mm, as well as the history of infiltrating ductal carcinoma of the right breast with estrogen receptor positive status, decision to proceed with LPS Peritoneal washings/BSO.  A Ca1 25 was repeated today.  Information given on the surgical procedure.  Patient's questions answered.  We will follow-up for a preop visit to discuss the preop management, the procedure risks and benefits, as well as the postop management more thoroughly. - CA 125  2. Infiltrating ductal carcinoma of right breast, stage 1 ([facility]) ER positive.  Finished Tamoxifen 6 months ago.  BrCa 1-2 negative.  3. Postmenopausal Well on no hormone replacement therapy with no postmenopausal bleeding.  Leslie Salazar, it was a pleasure seeing you today!  I will inform you of your results as soon as they are available. 

## 2018-05-31 ENCOUNTER — Encounter: Payer: Self-pay | Admitting: *Deleted

## 2018-06-09 ENCOUNTER — Telehealth: Payer: Self-pay | Admitting: *Deleted

## 2018-06-09 NOTE — Telephone Encounter (Signed)
Pt would like a work excuse letter for the days she will be out for surgery as well as recovery time.  Before writing this letter I will verify with [place] to see if everything else is taken care of pertaining to surgery that will take place on 07/04/2018 LAPAROSCOPIC BILATERAL SALPINGO OOPHORECTOMY [place].  Questions pertaining to letter:will verify how many days out of work will patient need?

## 2018-06-12 NOTE — Telephone Encounter (Signed)
Dr. Dellis Filbert  Patient needs a note for work regarding surgery. I will write. What is her recovery time and how long will she be out of work?  Thanks Sharrie Rothman CMA

## 2018-06-13 NOTE — Telephone Encounter (Signed)
Out of work x 1 week.

## 2018-06-14 NOTE — Telephone Encounter (Signed)
Spoke to patient. Letter faxed to patient [phone] attention to her. Pt out for a week for surgery. KW CMA

## 2018-06-14 NOTE — Telephone Encounter (Signed)
Lm for pt that letter can be written and I need to know where to send the letter. KW CMA

## 2018-06-23 ENCOUNTER — Other Ambulatory Visit: Payer: Self-pay

## 2018-06-23 ENCOUNTER — Encounter: Payer: Self-pay | Admitting: Obstetrics & Gynecology

## 2018-06-23 ENCOUNTER — Ambulatory Visit (INDEPENDENT_AMBULATORY_CARE_PROVIDER_SITE_OTHER): Payer: Federal, State, Local not specified - PPO | Admitting: Obstetrics & Gynecology

## 2018-06-23 VITALS — BP 120/78

## 2018-06-23 DIAGNOSIS — N83292 Other ovarian cyst, left side: Secondary | ICD-10-CM | POA: Diagnosis not present

## 2018-06-23 DIAGNOSIS — N959 Unspecified menopausal and perimenopausal disorder: Secondary | ICD-10-CM

## 2018-06-23 DIAGNOSIS — C50911 Malignant neoplasm of unspecified site of right female breast: Secondary | ICD-10-CM

## 2018-06-23 DIAGNOSIS — Z853 Personal history of malignant neoplasm of breast: Secondary | ICD-10-CM | POA: Diagnosis not present

## 2018-06-23 NOTE — Progress Notes (Signed)
    Leslie Salazar 1966-03-05 [id number]        53 y.o.  G1P1L1 Married  RP: Preop LPS Peritoneal washings/BSO for Complex Left Ovarian Cyst  HPI: No change x 05/29/2018.  Menopause, well on no HRT, no PMB.  No pelvic pain.  Rt Breast Infiltrating Ductal Ca, ER-PR positive.  Finished Tamoxifen x 6 months.  BrCa1-2 negative.  Last Pelvic US 06/16/2017 Left simple ovarian cyst 3 x 2.7 x 2.7 cm.  Ca 125 05/25/2017 was normal at 11.   OB History  Gravida Para Term Preterm AB Living  _0 SAB TAB Ectopic Multiple Live Births          1    # Outcome Date GA Lbr Len/2nd Weight Sex Delivery Anes PTL Lv  1 Term     M CS-Unspec  N LIV    Past medical history,surgical history, problem list, medications, allergies, family history and social history were all reviewed and documented in the EPIC chart.   Directed ROS with pertinent positives and negatives documented in the history of present illness/assessment and plan.  Exam:  Vitals:   06/23/18 0914  BP: 120/78   General appearance:  Normal  Pelvic US 05/29/2018: T/V images.  Uterus anteverted heterogeneous measuring 7.33 x 4.77 x 3.21 cm.  Intramural fibroid measured at 1.5 cm with calcifications and subserous left at 2.1 x 1.6 cm.  Endometrial lining is thin at 2.3 mm.  Right ovary normal.  Left ovary with a rim of tissue with normal color flow Doppler.  Thin-walled echo-free cystic mass on the left ovary measuring 3.4 x 3.1 x 2.9 cm, mean diameter at 3.1 cm, negative color flow Doppler.  Echogenic focus in the wall of the cyst measuring 4 mm with negative color flow Doppler.  Small cyst adjacent to the larger cyst measuring 9 x 8 mm.  No free fluid in the posterior cul-de-sac.  05/29/2018: Ca 125 normal at 8    Assessment/Plan:  53 y.o. G1P1001   1. Complex cyst of left ovary Pelvic US findings thoroughly reviewed with patient.  Given the changes in the left ovarian cyst including a small increase in size at 3.4 x 3.1 x 2.1 cm now  and also the echogenic focus on the wall of the cyst measuring 4 mm and a small cyst adjacent to the larger cyst measuring 9 x 8 mm, as well as the history of infiltrating ductal carcinoma of the right breast with estrogen receptor positive status, decision to proceed with LPS Peritoneal washings/BSO.  A Ca125 was repeated normal at 8.  Information given on the surgical procedure.  Patient's questions answered.  Preop management, the procedure risks and benefits, as well as the postop management discussed thoroughly.  2. Menopausal and perimenopausal disorder On no HRT.  3. Infiltrating ductal carcinoma of right breast, stage 1 ([place]) ER positive. Tomoxifen finished 7 months ago.  BrCa 1-2 negative.  Counseling on above issues and coordination of care more than 50% for 15 minutes.  Princess Bruins MD, 9:28 AM 06/23/2018

## 2018-06-26 ENCOUNTER — Encounter: Payer: Self-pay | Admitting: Obstetrics & Gynecology

## 2018-06-26 ENCOUNTER — Encounter (HOSPITAL_BASED_OUTPATIENT_CLINIC_OR_DEPARTMENT_OTHER): Payer: Self-pay | Admitting: *Deleted

## 2018-06-26 ENCOUNTER — Other Ambulatory Visit: Payer: Self-pay

## 2018-06-26 NOTE — Patient Instructions (Signed)
1. Complex cyst of left ovary Pelvic US findings thoroughly reviewed with patient.  Given the changes in the left ovarian cyst including a small increase in size at 3.4 x 3.1 x 2.1 cm now and also the echogenic focus on the wall of the cyst measuring 4 mm and a small cyst adjacent to the larger cyst measuring 9 x 8 mm, as well as the history of infiltrating ductal carcinoma of the right breast with estrogen receptor positive status, decision to proceed with LPS Peritoneal washings/BSO.  A Ca125 was repeated normal at 8.  Information given on the surgical procedure.  Patient's questions answered.  Preop management, the procedure risks and benefits, as well as the postop management discussed thoroughly.  2. Menopausal and perimenopausal disorder On no HRT.  3. Infiltrating ductal carcinoma of right breast, stage 1 ([facility]) ER positive. Tomoxifen finished 7 months ago.  BrCa 1-2 negative.  Leslie Salazar, it was a pleasure seeing you today! 

## 2018-06-26 NOTE — Progress Notes (Signed)
Spoke with patient via telephone for pre op interview. NPO after MN. Patient to take Toprol AM of surgery. Patient will need CBC, BMET, UPT, T&S AM of surgery. Current EKG in Epic. Arrival time 0630.

## 2018-07-04 ENCOUNTER — Ambulatory Visit (HOSPITAL_BASED_OUTPATIENT_CLINIC_OR_DEPARTMENT_OTHER): Admit: 2018-07-04 | Payer: Federal, State, Local not specified - PPO | Admitting: Obstetrics & Gynecology

## 2018-07-04 SURGERY — SALPINGO-OOPHORECTOMY, BILATERAL, LAPAROSCOPIC
Anesthesia: General | Laterality: Bilateral

## 2018-07-19 ENCOUNTER — Encounter: Payer: Self-pay | Admitting: *Deleted

## 2018-07-24 ENCOUNTER — Ambulatory Visit: Payer: Federal, State, Local not specified - PPO | Admitting: Obstetrics & Gynecology

## 2018-08-16 ENCOUNTER — Telehealth: Payer: Self-pay

## 2018-08-16 NOTE — Telephone Encounter (Signed)
I called patient and let her know we are resuming scheduling surgery. I let her know my next available date is May 20 and she would like to schedule there.  I scheduled her for 9:00am that day at [facility].  I had previously discussed ins benefits with her. She has two insurances so no surgery prepymt due.  She had pre op appt in March but I will check with Dr. Marguerita Merles to see if she would like to see her again prior to surgery.  Patient asked me to mail her a letter for work stating she will be out for two weeks for recovery and I included that in with her [facility] pamphlet I mailed her.

## 2018-08-17 ENCOUNTER — Other Ambulatory Visit: Payer: Self-pay | Admitting: Internal Medicine

## 2018-08-17 ENCOUNTER — Ambulatory Visit (INDEPENDENT_AMBULATORY_CARE_PROVIDER_SITE_OTHER): Payer: Federal, State, Local not specified - PPO | Admitting: Internal Medicine

## 2018-08-17 ENCOUNTER — Encounter: Payer: Self-pay | Admitting: Internal Medicine

## 2018-08-17 ENCOUNTER — Other Ambulatory Visit: Payer: Self-pay

## 2018-08-17 ENCOUNTER — Telehealth: Payer: Self-pay

## 2018-08-17 VITALS — BP 124/68 | HR 74 | Temp 98.4°F | Ht 62.0 in | Wt 147.4 lb

## 2018-08-17 DIAGNOSIS — N952 Postmenopausal atrophic vaginitis: Secondary | ICD-10-CM

## 2018-08-17 DIAGNOSIS — R002 Palpitations: Secondary | ICD-10-CM

## 2018-08-17 DIAGNOSIS — I10 Essential (primary) hypertension: Secondary | ICD-10-CM

## 2018-08-17 DIAGNOSIS — N951 Menopausal and female climacteric states: Secondary | ICD-10-CM | POA: Diagnosis not present

## 2018-08-17 DIAGNOSIS — Z79899 Other long term (current) drug therapy: Secondary | ICD-10-CM

## 2018-08-17 DIAGNOSIS — R3915 Urgency of urination: Secondary | ICD-10-CM | POA: Diagnosis not present

## 2018-08-17 DIAGNOSIS — Z853 Personal history of malignant neoplasm of breast: Secondary | ICD-10-CM

## 2018-08-17 LAB — POCT URINALYSIS DIPSTICK
Bilirubin, UA: NEGATIVE
Blood, UA: NEGATIVE
Glucose, UA: NEGATIVE
Ketones, UA: NEGATIVE
Leukocytes, UA: NEGATIVE
Nitrite, UA: NEGATIVE
Protein, UA: NEGATIVE
Spec Grav, UA: 1.025 (ref 1.010–1.025)
Urobilinogen, UA: 0.2 E.U./dL
pH, UA: 5.5 (ref 5.0–8.0)

## 2018-08-17 NOTE — Telephone Encounter (Signed)
Patient notified that her urine is normal. YRL,RMA

## 2018-08-17 NOTE — Patient Instructions (Signed)
Look up recipes for golden milk  Coconut milk, ginger, turmeric - you may use honey to sweeten, nutmeg is good as well.    Menopause and Hormone Replacement Therapy What is hormone replacement therapy?  Hormone replacement therapy (HRT) is the use of artificial (synthetic) hormones to replace hormones that your body stops producing during menopause. Menopause is the normal time of life when menstrual periods stop completely and the ovaries stop producing the female hormones estrogen and progesterone. This lack of hormones can affect your health and cause undesirable symptoms. HRT can relieve some of those symptoms. What are my options for HRT? HRT may consist of the synthetic hormones estrogen and progestin, or it may consist of only estrogen (estrogen-only therapy). You and your health care provider will decide which form of HRT is best for you. If you choose to be on HRT and you have a uterus, estrogen and progestin are usually prescribed. Estrogen-only therapy is used for women who do not have a uterus. Possible options for taking HRT include:  Pills.  Patches.  Gels.  Sprays.  Vaginal cream.  Vaginal rings.  Vaginal inserts. The amount of hormone(s) that you take and how long you take the hormone(s) varies depending on your individual health. It is important to:  Begin HRT with the lowest possible dosage.  Stop HRT as soon as your health care provider tells you to stop.  Work with your health care provider so that you feel informed and comfortable with your decisions. What are the benefits of HRT? HRT can reduce the frequency and severity of menopausal symptoms. Benefits of HRT vary depending on the menopausal symptoms that you have, the severity of your symptoms, and your overall health. HRT may help to improve the following menopausal symptoms:  Hot flashes and night sweats. These are sudden feelings of heat that spread over the face and body. The skin may turn red,  like a blush. Night sweats are hot flashes that happen while you are sleeping or trying to sleep.  Bone loss (osteoporosis). The body loses calcium more quickly after menopause, causing the bones to become weaker. This can increase the risk for bone breaks (fractures).  Vaginal dryness. The lining of the vagina can become thin and dry, which can cause pain during sexual intercourse or cause infection, burning, or itching.  Urinary tract infections.  Urinary incontinence. This is a decreased ability to control when you urinate.  Irritability.  Short-term memory problems. What are the risks of HRT? Risks of HRT vary depending on your individual health and medical history. Risks of HRT also depend on whether you receive both estrogen and progestin or you receive estrogen only.HRT may increase the risk of:  Spotting. This is when a small amount of bloodleaks from the vagina unexpectedly.  Endometrial cancer. This cancer is in the lining of the uterus (endometrium).  Breast cancer.  Increased density of breast tissue. This can make it harder to find breast cancer on a breast X-ray (mammogram).  Stroke.  Heart attack.  Blood clots.  Gallbladder disease. Risks of HRT can increase if you have any of the following conditions:  Endometrial cancer.  Liver disease.  Heart disease.  Breast cancer.  History of blood clots.  History of stroke. How should I care for myself while I am on HRT?  Take over-the-counter and prescription medicines only as told by your health care provider.  Get mammograms, pelvic exams, and medical checkups as often as told by your health care provider.  Have Pap tests done as often as told by your health care provider. A Pap test is sometimes called a Pap smear. It is a screening test that is used to check for signs of cancer of the cervix and vagina. A Pap test can also identify the presence of infection or precancerous changes. Pap tests may be done:  ? Every 3 years, starting at age 23. ? Every 5 years, starting after age 79, in combination with testing for human papillomavirus (HPV). ? More often or less often depending on other medical conditions you have, your age, and other risk factors.  It is your responsibility to get your Pap test results. Ask your health care provider or the department performing the test when your results will be ready.  Keep all follow-up visits as told by your health care provider. This is important. When should I seek medical care? Talk with your health care provider if:  You have any of these: ? Pain or swelling in your legs. ? Shortness of breath. ? Chest pain. ? Lumps or changes in your breasts or armpits. ? Slurred speech. ? Pain, burning, or bleeding when you urine.  You develop any of these: ? Unusual vaginal bleeding. ? Dizziness or headaches. ? Weakness or numbness in any part of your arms or legs. ? Pain in your abdomen. This information is not intended to replace advice given to you by your health care provider. Make sure you discuss any questions you have with your health care provider. Document Released: 12/26/2002 Document Revised: 11/11/2016 Document Reviewed: 09/30/2014 Elsevier Interactive Patient Education  2019 [employer].

## 2018-08-18 ENCOUNTER — Encounter: Payer: Self-pay | Admitting: Internal Medicine

## 2018-08-18 LAB — CMP14+EGFR
ALT: 26 IU/L (ref 0–32)
AST: 22 IU/L (ref 0–40)
Albumin/Globulin Ratio: 2 (ref 1.2–2.2)
Albumin: 4.4 g/dL (ref 3.8–4.9)
Alkaline Phosphatase: 70 IU/L (ref 39–117)
BUN/Creatinine Ratio: 18 (ref 9–23)
BUN: 14 mg/dL (ref 6–24)
Bilirubin Total: 1 mg/dL (ref 0.0–1.2)
CO2: 25 mmol/L (ref 20–29)
Calcium: 9.4 mg/dL (ref 8.7–10.2)
Chloride: 104 mmol/L (ref 96–106)
Creatinine, Ser: 0.8 mg/dL (ref 0.57–1.00)
GFR calc Af Amer: 98 mL/min/{1.73_m2} (ref >59)
GFR calc non Af Amer: 85 mL/min/{1.73_m2} (ref >59)
Globulin, Total: 2.2 g/dL (ref 1.5–4.5)
Glucose: 74 mg/dL (ref 65–99)
Potassium: 4 mmol/L (ref 3.5–5.2)
Sodium: 146 mmol/L — ABNORMAL HIGH (ref 134–144)
Total Protein: 6.6 g/dL (ref 6.0–8.5)

## 2018-08-18 LAB — MAGNESIUM: Magnesium: 1.9 mg/dL (ref 1.6–2.3)

## 2018-08-18 LAB — TSH: TSH: 1.32 u[IU]/mL (ref 0.450–4.500)

## 2018-08-20 NOTE — Progress Notes (Signed)
Subjective:     Patient ID: Leslie Salazar , female    DOB: [birth year] , 53 y.o.   MRN: [id number]   Chief Complaint  Patient presents with  . Hypertension    HPI  Hypertension  This is a chronic problem. The current episode started more than 1 year ago. The problem has been rapidly improving since onset. The problem is controlled. Associated symptoms include palpitations (states she had episode of palpitations - occurred about a month ago - reports had several cups of coffee that day, and one right before walking with her friend. no associated cp. no repeat occurrences. ). Pertinent negatives include no blurred vision, chest pain, headaches, neck pain, orthopnea or shortness of breath. Risk factors for coronary artery disease include post-menopausal state. The current treatment provides moderate improvement. There are no compliance problems.      Past Medical History:  Diagnosis Date  . Acne   . Cancer ([place]) 2012   RIGHT DUCTAL CARCINOMA INSITU..  . Complication of anesthesia   . Condyloma    ON BUTTOCKS  . 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 High cholesterol   . HSV-1 (herpes simplex virus 1) infection   . Hx of radiation therapy 2013   from april to may - six weeks  . Hypertension   . PONV (postoperative nausea and vomiting)    nausea no vomiting  . Scoliosis   . Wears glasses      Family History  Problem Relation Age of Onset  . Colon cancer Paternal Aunt   . Diabetes Mother        HAS HAD A KIDNEY TRANSPLATN  . Hypertension Mother   . Kidney failure Mother   . Hypertension Father   . Gallstones Father   . Gallstones Sister      Current Outpatient Medications:  .  atorvastatin (LIPITOR) 20 MG tablet, Take 20 mg by mouth daily. Reported on 05/22/2015, Disp: , Rfl:  .  Calcium Citrate-Vitamin D (CITRACAL + D PO), Take 1 tablet by mouth 2 (two) times daily. '900mg'$  Calcium-1000iu Vitamin D, Disp: , Rfl:  .  Cholecalciferol  (VITAMIN D-1000 MAX ST) 1000 units tablet, Take 1 tablet by mouth daily. 2400 mg daily, Disp: , Rfl:  .  Coenzyme Q10 100 MG capsule, Take 1 capsule by mouth daily., Disp: , Rfl:  .  hydrochlorothiazide (MICROZIDE) 12.5 MG capsule, TAKE 1 CAPSULE BY MOUTH EVERY DAY, Disp: 90 capsule, Rfl: 1 .  hydrocortisone (ANUSOL-HC) 2.5 % rectal cream, Place 1 application rectally 2 (two) times daily., Disp: 30 g, Rfl: 3 .  loratadine (CLARITIN) 10 MG tablet, Take 10 mg by mouth daily., Disp: , Rfl:  .  metoprolol succinate (TOPROL-XL) 25 MG 24 hr tablet, TAKE 1 TABLET BY MOUTH DAILY, Disp: 90 tablet, Rfl: 1 .  Multiple Vitamins-Minerals (MULTIVITAMIN WOMENS 50+ ADV PO), Take by mouth., Disp: , Rfl:  .  Omega-3 Fatty Acids (FISH OIL PO), Take by mouth., Disp: , Rfl:  .  POTASSIUM GLUCONATE PO, Take 550 mg by mouth daily. , Disp: , Rfl:  .  valACYclovir (VALTREX) 500 MG tablet, Take 1 tablet (500 mg total) by mouth daily., Disp: 90 tablet, Rfl: 4   No Known Allergies   Review of Systems  Constitutional: Negative.   Eyes: Negative for blurred vision.  Respiratory: Negative.  Negative for shortness of breath.   Cardiovascular: Positive for palpitations (states she had episode of palpitations -  occurred about a month ago - reports had several cups of coffee that day, and one right before walking with her friend. no associated cp. no repeat occurrences. ). Negative for chest pain and orthopnea.  Gastrointestinal: Negative.   Genitourinary: Positive for urgency and vaginal pain (she does have pain with intercourse - since chemo. improved with use of vit E suppositories).       Reports occ. Urinary urgency - must use bathroom immediately. Sx seem to be worsening. Has yet to d/w her GYN  Musculoskeletal: Negative for neck pain.  Neurological: Negative.  Negative for headaches.  Psychiatric/Behavioral: Negative.      Today's Vitals   08/17/18 0942  BP: 124/68  Pulse: 74  Temp: 98.4 F (36.9 C)  TempSrc:  Oral  Weight: 147 lb 6.4 oz (66.9 kg)  Height: '5\' 2"'$  (1.575 m)   Body mass index is 26.96 kg/m.   Objective:  Physical Exam Vitals signs and nursing note reviewed.  Constitutional:      Appearance: Normal appearance.  HENT:     Head: Normocephalic and atraumatic.  Cardiovascular:     Rate and Rhythm: Normal rate and regular rhythm.     Heart sounds: Normal heart sounds.  Pulmonary:     Effort: Pulmonary effort is normal.     Breath sounds: Normal breath sounds.  Skin:    General: Skin is warm.  Neurological:     General: No focal deficit present.     Mental Status: She is alert.  Psychiatric:        Mood and Affect: Mood normal.        Behavior: Behavior normal.         Assessment And Plan:     1. Essential hypertension, benign  Well controlled. She will continue with current meds. I will check labs as listed below. She will rto in six months for a full physical examination.   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 Pt advised she may benefit from Remifemin. She will also resume use of golden milk nightly.   3. Atrophic vaginitis  This is a result of chemotherapy as treatment for breast CA years ago. She will continue with use of vaginal suppositories.   4. Palpitations   Intermittent. I will check labs as listed below. She is encouraged to limit her caffeine intake to before 2 pm and to increase her water intake. Also advised to take magnesium nightly.   - TSH - Magnesium  5. Urinary urgency  I will check urinalysis today. I will make further recommendations once her labs are available for review. She is also advised that some GYNs perform bladder studies in their practice and that she should discuss further with Dr. Dellis Filbert.   - POCT Urinalysis Dipstick (81002)  6. Drug therapy   Maximino Greenland, MD    THE PATIENT IS ENCOURAGED TO PRACTICE SOCIAL DISTANCING DUE TO THE COVID-19 PANDEMIC.

## 2018-08-21 ENCOUNTER — Encounter (HOSPITAL_BASED_OUTPATIENT_CLINIC_OR_DEPARTMENT_OTHER): Payer: Self-pay

## 2018-08-21 ENCOUNTER — Other Ambulatory Visit: Payer: Self-pay | Admitting: Internal Medicine

## 2018-08-21 ENCOUNTER — Other Ambulatory Visit: Payer: Self-pay

## 2018-08-21 NOTE — Progress Notes (Signed)
SPOKE W/  Sharyn Lull     SCREENING SYMPTOMS OF COVID 19:   COUGH--NO  RUNNY NOSE---NO  SORE THROAT---NO  NASAL CONGESTION----NO  SNEEZING----NO  SHORTNESS OF BREATH---NO  DIFFICULTY BREATHING---NO  TEMP >100.0 -----NO  UNEXPLAINED BODY ACHES------NO  CHILLS --------NO   HEADACHES ---------NO  LOSS OF SMELL/ TASTE --------NO    HAVE YOU OR ANY FAMILY MEMBER TRAVELLED PAST 14 DAYS OUT OF THE   COUNTY---NO STATE----NO COUNTRY----NO  HAVE YOU OR ANY FAMILY MEMBER BEEN EXPOSED TO ANYONE WITH COVID 19? NO

## 2018-08-21 NOTE — Progress Notes (Signed)
Spoke with:  Sharyn Lull NPO: No food after midnight/Clear liquids until 4:30AM DOS/DRINK Arrival time: 0700AM Labs: (COVID, CBC, BMP, T&S 08/25/2018, EKG 02/2018 in epic) AM medications: Atorvastatin, Loratadine, Metoprolol, Valacyclovir Pre op orders: Yes Ride home:  Oswaldo Milian (Husband) [phone]

## 2018-08-22 ENCOUNTER — Telehealth: Payer: Self-pay

## 2018-08-22 NOTE — Progress Notes (Addendum)
Pt called via phone inquiring about her medication instructions for dos.  Pt asked about HCTZ.  Pt told she to continue taking HCTZ but do not take morning of surgery since it is a diuretic, pt verbalized understanding. Also, asking about which vitamins to stop or not.  Pt told to stop fish oil today, continue other vitamins, and since coq10 is taken to prevent cramping due to taking lipitor can continue taking it,  Pt verbalized understanding. Pt verbalized understanding morning of surgery to take lipitor, metoprolol, claritin, and valtrex.

## 2018-08-22 NOTE — Telephone Encounter (Signed)
I contacted patient about if another pre op appt needed. Dr. Dellis Filbert said to offer her an appointment but if no change and she preferrred not to, it is ok. Patient declined appointment stating she had no further questions/discussion.  Patient said Preadmitting Nurse told her to check with you about her Vitamins and if she needed to d/c them prior to surgery. Please advise.  Fish Oil Co-Enzyme 10 One A day Multi-Vit 50+ Calcium 600 mg/Vit D 400 Vitamin D 1000 iu Potassium Glutomate 550 mg.

## 2018-08-23 NOTE — Telephone Encounter (Signed)
Yes, stop everything until surgery.

## 2018-08-23 NOTE — Telephone Encounter (Signed)
Per DPR access note on file I left message in voice mail advising her to d/c list of vitamins/supplements.

## 2018-08-25 ENCOUNTER — Other Ambulatory Visit: Payer: Self-pay

## 2018-08-25 ENCOUNTER — Encounter (HOSPITAL_COMMUNITY)
Admission: RE | Admit: 2018-08-25 | Discharge: 2018-08-25 | Disposition: A | Discharge status: Home / Self Care | Payer: Federal, State, Local not specified - PPO | Source: Ambulatory Visit | Attending: Obstetrics & Gynecology | Admitting: Obstetrics & Gynecology

## 2018-08-25 ENCOUNTER — Other Ambulatory Visit (HOSPITAL_COMMUNITY)
Admission: RE | Admit: 2018-08-25 | Discharge: 2018-08-25 | Disposition: A | Discharge status: Home / Self Care | Payer: Federal, State, Local not specified - PPO | Source: Ambulatory Visit | Attending: Obstetrics & Gynecology | Admitting: Obstetrics & Gynecology

## 2018-08-25 DIAGNOSIS — Z1159 Encounter for screening for other viral diseases: Secondary | ICD-10-CM | POA: Insufficient documentation

## 2018-08-25 DIAGNOSIS — Z01812 Encounter for preprocedural laboratory examination: Secondary | ICD-10-CM | POA: Insufficient documentation

## 2018-08-25 LAB — CBC
HCT: 39 % (ref 36.0–46.0)
Hemoglobin: 13.2 g/dL (ref 12.0–15.0)
MCH: 31.1 pg (ref 26.0–34.0)
MCHC: 33.8 g/dL (ref 30.0–36.0)
MCV: 92 fL (ref 80.0–100.0)
Platelets: 237 10*3/uL (ref 150–400)
RBC: 4.24 MIL/uL (ref 3.87–5.11)
RDW: 12.7 % (ref 11.5–15.5)
WBC: 9.1 10*3/uL (ref 4.0–10.5)
nRBC: 0 % (ref 0.0–0.2)

## 2018-08-25 LAB — BASIC METABOLIC PANEL
Anion gap: 9 (ref 5–15)
BUN: 15 mg/dL (ref 6–20)
CO2: 24 mmol/L (ref 22–32)
Calcium: 9.1 mg/dL (ref 8.9–10.3)
Chloride: 105 mmol/L (ref 98–111)
Creatinine, Ser: 0.63 mg/dL (ref 0.44–1.00)
GFR calc Af Amer: >60 mL/min (ref >60)
GFR calc non Af Amer: >60 mL/min (ref >60)
Glucose, Bld: 103 mg/dL — ABNORMAL HIGH (ref 70–99)
Potassium: 3.6 mmol/L (ref 3.5–5.1)
Sodium: 138 mmol/L (ref 135–145)

## 2018-08-26 LAB — NOVEL CORONAVIRUS, NAA (HOSP ORDER, SEND-OUT TO REF LAB; TAT 18-24 HRS): SARS-CoV-2, NAA: NOT DETECTED

## 2018-08-26 LAB — ABO/RH: ABO/RH(D): O POS

## 2018-08-29 NOTE — Progress Notes (Signed)
SPOKE W/  _MICHELLE HAARRID     SCREENING SYMPTOMS OF COVID 19:   COUGH--NO  RUNNY NOSE--- NO  SORE THROAT---NO  NASAL CONGESTION----NO  SNEEZING----NO  SHORTNESS OF BREATH---NO  DIFFICULTY BREATHING---NO  TEMP >100.0 -----NO  UNEXPLAINED BODY ACHES------NO  CHILLS -------- NO  HEADACHES ---------NO  LOSS OF SMELL/ TASTE --------NO    HAVE YOU OR ANY FAMILY MEMBER TRAVELLED PAST 14 DAYS OUT OF THE   COUNTY---NO STATE----NO COUNTRY----NO  HAVE YOU OR ANY FAMILY MEMBER BEEN EXPOSED TO ANYONE WITH COVID 19?  NO

## 2018-08-30 ENCOUNTER — Ambulatory Visit (HOSPITAL_BASED_OUTPATIENT_CLINIC_OR_DEPARTMENT_OTHER)
Admission: RE | Admit: 2018-08-30 | Discharge: 2018-08-30 | Disposition: A | Discharge status: Home / Self Care | Payer: Federal, State, Local not specified - PPO | Attending: Obstetrics & Gynecology | Admitting: Obstetrics & Gynecology

## 2018-08-30 ENCOUNTER — Ambulatory Visit (HOSPITAL_BASED_OUTPATIENT_CLINIC_OR_DEPARTMENT_OTHER): Payer: Federal, State, Local not specified - PPO | Admitting: Anesthesiology

## 2018-08-30 ENCOUNTER — Other Ambulatory Visit (HOSPITAL_BASED_OUTPATIENT_CLINIC_OR_DEPARTMENT_OTHER): Payer: Self-pay | Admitting: Obstetrics & Gynecology

## 2018-08-30 ENCOUNTER — Other Ambulatory Visit: Payer: Self-pay

## 2018-08-30 ENCOUNTER — Ambulatory Visit (HOSPITAL_BASED_OUTPATIENT_CLINIC_OR_DEPARTMENT_OTHER): Payer: Federal, State, Local not specified - PPO | Admitting: Physician Assistant

## 2018-08-30 ENCOUNTER — Encounter (HOSPITAL_BASED_OUTPATIENT_CLINIC_OR_DEPARTMENT_OTHER): Payer: Self-pay

## 2018-08-30 ENCOUNTER — Encounter (HOSPITAL_BASED_OUTPATIENT_CLINIC_OR_DEPARTMENT_OTHER)
Admission: RE | Disposition: A | Discharge status: Home / Self Care | Payer: Self-pay | Source: Home / Self Care | Attending: Obstetrics & Gynecology

## 2018-08-30 DIAGNOSIS — Z7982 Long term (current) use of aspirin: Secondary | ICD-10-CM | POA: Diagnosis not present

## 2018-08-30 DIAGNOSIS — D252 Subserosal leiomyoma of uterus: Secondary | ICD-10-CM | POA: Diagnosis not present

## 2018-08-30 DIAGNOSIS — I10 Essential (primary) hypertension: Secondary | ICD-10-CM | POA: Diagnosis not present

## 2018-08-30 DIAGNOSIS — E7800 Pure hypercholesterolemia, unspecified: Secondary | ICD-10-CM | POA: Diagnosis not present

## 2018-08-30 DIAGNOSIS — Z79899 Other long term (current) drug therapy: Secondary | ICD-10-CM | POA: Insufficient documentation

## 2018-08-30 DIAGNOSIS — N83202 Unspecified ovarian cyst, left side: Secondary | ICD-10-CM | POA: Insufficient documentation

## 2018-08-30 DIAGNOSIS — Z923 Personal history of irradiation: Secondary | ICD-10-CM | POA: Insufficient documentation

## 2018-08-30 DIAGNOSIS — I209 Angina pectoris, unspecified: Secondary | ICD-10-CM | POA: Diagnosis not present

## 2018-08-30 DIAGNOSIS — Z853 Personal history of malignant neoplasm of breast: Secondary | ICD-10-CM | POA: Diagnosis not present

## 2018-08-30 DIAGNOSIS — Z170 Estrogen receptor positive status [ER+]: Secondary | ICD-10-CM | POA: Diagnosis not present

## 2018-08-30 DIAGNOSIS — R8569 Abnormal cytological findings in specimens from other digestive organs and abdominal cavity: Secondary | ICD-10-CM | POA: Diagnosis not present

## 2018-08-30 DIAGNOSIS — D271 Benign neoplasm of left ovary: Secondary | ICD-10-CM | POA: Diagnosis not present

## 2018-08-30 DIAGNOSIS — Z9851 Tubal ligation status: Secondary | ICD-10-CM | POA: Diagnosis not present

## 2018-08-30 DIAGNOSIS — C50919 Malignant neoplasm of unspecified site of unspecified female breast: Secondary | ICD-10-CM | POA: Diagnosis not present

## 2018-08-30 HISTORY — PX: LAPAROSCOPIC BILATERAL SALPINGO OOPHERECTOMY: SHX5890

## 2018-08-30 HISTORY — DX: Benign neoplasm of connective and other soft tissue, unspecified: D21.9

## 2018-08-30 HISTORY — DX: Personal history of other diseases of the circulatory system: Z86.79

## 2018-08-30 HISTORY — DX: Unspecified ovarian cyst, left side: N83.202

## 2018-08-30 HISTORY — DX: Urgency of urination: R39.15

## 2018-08-30 HISTORY — DX: Palpitations: R00.2

## 2018-08-30 LAB — TYPE AND SCREEN
ABO/RH(D): O POS
Antibody Screen: NEGATIVE

## 2018-08-30 LAB — POCT PREGNANCY, URINE: Preg Test, Ur: NEGATIVE

## 2018-08-30 SURGERY — SALPINGO-OOPHORECTOMY, BILATERAL, LAPAROSCOPIC
Anesthesia: General | Laterality: Bilateral

## 2018-08-30 MED ORDER — CEFAZOLIN SODIUM-DEXTROSE 2-4 GM/100ML-% IV SOLN
INTRAVENOUS | Status: AC
  Filled 2018-08-30: qty 100

## 2018-08-30 MED ORDER — SUGAMMADEX SODIUM 200 MG/2ML IV SOLN
INTRAVENOUS | Status: DC | PRN
  Administered 2018-08-30: 200 mg via INTRAVENOUS

## 2018-08-30 MED ORDER — SODIUM CHLORIDE 0.9 % IR SOLN
Status: DC | PRN
  Administered 2018-08-30: 3000 mL

## 2018-08-30 MED ORDER — ACETAMINOPHEN 325 MG PO TABS
325.0000 mg | ORAL_TABLET | Freq: Once | ORAL | Status: DC | PRN
  Filled 2018-08-30: qty 2

## 2018-08-30 MED ORDER — DEXAMETHASONE SODIUM PHOSPHATE 10 MG/ML IJ SOLN
INTRAMUSCULAR | Status: DC | PRN
  Administered 2018-08-30: 10 mg via INTRAVENOUS

## 2018-08-30 MED ORDER — LACTATED RINGERS IV SOLN
INTRAVENOUS | Status: DC
  Administered 2018-08-30: 11:00:00 via INTRAVENOUS
  Filled 2018-08-30: qty 1000

## 2018-08-30 MED ORDER — FENTANYL CITRATE (PF) 250 MCG/5ML IJ SOLN
INTRAMUSCULAR | Status: AC
  Filled 2018-08-30: qty 5

## 2018-08-30 MED ORDER — ROCURONIUM BROMIDE 10 MG/ML (PF) SYRINGE
PREFILLED_SYRINGE | INTRAVENOUS | Status: DC | PRN
  Administered 2018-08-30: 40 mg via INTRAVENOUS

## 2018-08-30 MED ORDER — HYDROMORPHONE HCL 1 MG/ML IJ SOLN
0.2500 mg | INTRAMUSCULAR | Status: DC | PRN
  Administered 2018-08-30: 11:00:00 0.25 mg via INTRAVENOUS
  Filled 2018-08-30: qty 0.5

## 2018-08-30 MED ORDER — KETOROLAC TROMETHAMINE 30 MG/ML IJ SOLN
INTRAMUSCULAR | Status: DC | PRN
  Administered 2018-08-30: 30 mg via INTRAVENOUS

## 2018-08-30 MED ORDER — ROCURONIUM BROMIDE 10 MG/ML (PF) SYRINGE
PREFILLED_SYRINGE | INTRAVENOUS | Status: AC
  Filled 2018-08-30: qty 10

## 2018-08-30 MED ORDER — PROMETHAZINE HCL 25 MG/ML IJ SOLN
INTRAMUSCULAR | Status: AC
  Filled 2018-08-30: qty 1

## 2018-08-30 MED ORDER — ONDANSETRON HCL 4 MG/2ML IJ SOLN
INTRAMUSCULAR | Status: DC | PRN
  Administered 2018-08-30: 4 mg via INTRAVENOUS

## 2018-08-30 MED ORDER — SUCCINYLCHOLINE CHLORIDE 200 MG/10ML IV SOSY
PREFILLED_SYRINGE | INTRAVENOUS | Status: DC | PRN
  Administered 2018-08-30: 120 mg via INTRAVENOUS

## 2018-08-30 MED ORDER — MEPERIDINE HCL 25 MG/ML IJ SOLN
INTRAMUSCULAR | Status: AC
  Filled 2018-08-30: qty 1

## 2018-08-30 MED ORDER — ARTIFICIAL TEARS OPHTHALMIC OINT
TOPICAL_OINTMENT | OPHTHALMIC | Status: AC
  Filled 2018-08-30: qty 3.5

## 2018-08-30 MED ORDER — SUGAMMADEX SODIUM 200 MG/2ML IV SOLN
INTRAVENOUS | Status: AC
  Filled 2018-08-30: qty 2

## 2018-08-30 MED ORDER — FENTANYL CITRATE (PF) 100 MCG/2ML IJ SOLN
INTRAMUSCULAR | Status: DC | PRN
  Administered 2018-08-30: 100 ug via INTRAVENOUS
  Administered 2018-08-30: 50 ug via INTRAVENOUS

## 2018-08-30 MED ORDER — SCOPOLAMINE 1 MG/3DAYS TD PT72
MEDICATED_PATCH | TRANSDERMAL | Status: DC | PRN
  Administered 2018-08-30: 1 via TRANSDERMAL

## 2018-08-30 MED ORDER — ACETAMINOPHEN 10 MG/ML IV SOLN
1000.0000 mg | Freq: Once | INTRAVENOUS | Status: DC | PRN
  Filled 2018-08-30: qty 100

## 2018-08-30 MED ORDER — SUCCINYLCHOLINE CHLORIDE 200 MG/10ML IV SOSY
PREFILLED_SYRINGE | INTRAVENOUS | Status: AC
  Filled 2018-08-30: qty 10

## 2018-08-30 MED ORDER — HYDROMORPHONE HCL 1 MG/ML IJ SOLN
INTRAMUSCULAR | Status: AC
  Filled 2018-08-30: qty 1

## 2018-08-30 MED ORDER — MEPERIDINE HCL 25 MG/ML IJ SOLN
6.2500 mg | INTRAMUSCULAR | Status: DC | PRN
  Filled 2018-08-30: qty 1

## 2018-08-30 MED ORDER — PROMETHAZINE HCL 25 MG/ML IJ SOLN
6.2500 mg | INTRAMUSCULAR | Status: DC | PRN
  Administered 2018-08-30: 6.25 mg via INTRAVENOUS
  Filled 2018-08-30: qty 1

## 2018-08-30 MED ORDER — PROPOFOL 10 MG/ML IV BOLUS
INTRAVENOUS | Status: AC
  Filled 2018-08-30: qty 20

## 2018-08-30 MED ORDER — BUPIVACAINE HCL (PF) 0.25 % IJ SOLN
INTRAMUSCULAR | Status: DC | PRN
  Administered 2018-08-30: 16 mL

## 2018-08-30 MED ORDER — LACTATED RINGERS IV SOLN
INTRAVENOUS | Status: DC
  Administered 2018-08-30: 08:00:00 125 mL/h via INTRAVENOUS
  Filled 2018-08-30: qty 1000

## 2018-08-30 MED ORDER — PROPOFOL 10 MG/ML IV BOLUS
INTRAVENOUS | Status: DC | PRN
  Administered 2018-08-30: 130 mg via INTRAVENOUS

## 2018-08-30 MED ORDER — MIDAZOLAM HCL 2 MG/2ML IJ SOLN
INTRAMUSCULAR | Status: AC
  Filled 2018-08-30: qty 2

## 2018-08-30 MED ORDER — ONDANSETRON HCL 4 MG/2ML IJ SOLN
INTRAMUSCULAR | Status: AC
  Filled 2018-08-30: qty 2

## 2018-08-30 MED ORDER — SCOPOLAMINE 1 MG/3DAYS TD PT72
MEDICATED_PATCH | TRANSDERMAL | Status: AC
  Filled 2018-08-30: qty 1

## 2018-08-30 MED ORDER — ACETAMINOPHEN 160 MG/5ML PO SOLN
325.0000 mg | Freq: Once | ORAL | Status: DC | PRN
  Filled 2018-08-30: qty 20.3

## 2018-08-30 MED ORDER — KETOROLAC TROMETHAMINE 30 MG/ML IJ SOLN
INTRAMUSCULAR | Status: AC
  Filled 2018-08-30: qty 1

## 2018-08-30 MED ORDER — MIDAZOLAM HCL 2 MG/2ML IJ SOLN
INTRAMUSCULAR | Status: DC | PRN
  Administered 2018-08-30: 2 mg via INTRAVENOUS

## 2018-08-30 MED ORDER — LIDOCAINE 2% (20 MG/ML) 5 ML SYRINGE
INTRAMUSCULAR | Status: DC | PRN
  Administered 2018-08-30: 40 mg via INTRAVENOUS

## 2018-08-30 MED ORDER — OXYCODONE HCL 5 MG/5ML PO SOLN
5.0000 mg | Freq: Once | ORAL | Status: DC | PRN
  Filled 2018-08-30: qty 5

## 2018-08-30 MED ORDER — DEXAMETHASONE SODIUM PHOSPHATE 10 MG/ML IJ SOLN
INTRAMUSCULAR | Status: AC
  Filled 2018-08-30: qty 1

## 2018-08-30 MED ORDER — LIDOCAINE 2% (20 MG/ML) 5 ML SYRINGE
INTRAMUSCULAR | Status: AC
  Filled 2018-08-30: qty 5

## 2018-08-30 MED ORDER — CEFAZOLIN SODIUM-DEXTROSE 2-4 GM/100ML-% IV SOLN
2.0000 g | INTRAVENOUS | Status: AC
  Administered 2018-08-30: 2 g via INTRAVENOUS
  Filled 2018-08-30: qty 100

## 2018-08-30 MED ORDER — OXYCODONE HCL 5 MG PO TABS
5.0000 mg | ORAL_TABLET | Freq: Once | ORAL | Status: DC | PRN
  Filled 2018-08-30: qty 1

## 2018-08-30 SURGICAL SUPPLY — 55 items
ADH SKN CLS APL DERMABOND .7 (GAUZE/BANDAGES/DRESSINGS) ×1
APL PRP STRL LF DISP 70% ISPRP (MISCELLANEOUS) ×1
APL SRG 38 LTWT LNG FL B (MISCELLANEOUS)
APPLICATOR ARISTA FLEXITIP XL (MISCELLANEOUS) IMPLANT
BAG LAPAROSCOPIC 12 15 PORT 16 (BASKET) IMPLANT
BAG RETRIEVAL 12/15 (BASKET)
BARRIER ADHS 3X4 INTERCEED (GAUZE/BANDAGES/DRESSINGS) IMPLANT
BRR ADH 4X3 ABS CNTRL BYND (GAUZE/BANDAGES/DRESSINGS)
CABLE HIGH FREQUENCY MONO STRZ (ELECTRODE) IMPLANT
CATH ROBINSON RED A/P 16FR (CATHETERS) IMPLANT
CHLORAPREP W/TINT 26 (MISCELLANEOUS) ×1 IMPLANT
CONT SPEC 4OZ CLIKSEAL STRL BL (MISCELLANEOUS) ×2 IMPLANT
COVER MAYO STAND STRL (DRAPES) ×2 IMPLANT
COVER WAND RF STERILE (DRAPES) ×2 IMPLANT
DERMABOND ADVANCED (GAUZE/BANDAGES/DRESSINGS) ×1
DERMABOND ADVANCED .7 DNX12 (GAUZE/BANDAGES/DRESSINGS) ×1 IMPLANT
DRSG COVADERM PLUS 2X2 (GAUZE/BANDAGES/DRESSINGS) ×1 IMPLANT
DRSG OPSITE POSTOP 3X4 (GAUZE/BANDAGES/DRESSINGS) IMPLANT
DURAPREP 26ML APPLICATOR (WOUND CARE) IMPLANT
GAUZE 4X4 16PLY RFD (DISPOSABLE) ×2 IMPLANT
GLOVE BIO SURGEON STRL SZ 6.5 (GLOVE) ×4 IMPLANT
GLOVE BIO SURGEON STRL SZ7.5 (GLOVE) IMPLANT
GLOVE BIOGEL PI IND STRL 7.0 (GLOVE) ×2 IMPLANT
GLOVE BIOGEL PI INDICATOR 7.0 (GLOVE) ×3
GOWN STRL REUS W/TWL LRG LVL3 (GOWN DISPOSABLE) ×6 IMPLANT
GOWN STRL REUS W/TWL XL LVL3 (GOWN DISPOSABLE) IMPLANT
HEMOSTAT ARISTA ABSORB 3G PWDR (HEMOSTASIS) IMPLANT
MANIPULATOR UTERINE 4.5 ZUMI (MISCELLANEOUS) IMPLANT
NDL SAFETY ECLIPSE 18X1.5 (NEEDLE) IMPLANT
NEEDLE HYPO 18GX1.5 SHARP (NEEDLE)
PACK LAPAROSCOPY BASIN (CUSTOM PROCEDURE TRAY) ×2 IMPLANT
PACK TRENDGUARD 450 HYBRID PRO (MISCELLANEOUS) ×1 IMPLANT
PAD OB MATERNITY 4.3X12.25 (PERSONAL CARE ITEMS) ×2 IMPLANT
PROTECTOR NERVE ULNAR (MISCELLANEOUS) ×4 IMPLANT
SCISSORS LAP 5X35 DISP (ENDOMECHANICALS) IMPLANT
SET IRRIG TUBING LAPAROSCOPIC (IRRIGATION / IRRIGATOR) ×2 IMPLANT
SHEARS HARMONIC ACE PLUS 36CM (ENDOMECHANICALS) ×2 IMPLANT
SOLUTION ELECTROLUBE (MISCELLANEOUS) ×2 IMPLANT
SUT MNCRL AB 4-0 PS2 18 (SUTURE) ×4 IMPLANT
SUT VICRYL 0 UR6 27IN ABS (SUTURE) ×2 IMPLANT
SYR 50ML LL SCALE MARK (SYRINGE) IMPLANT
SYS BAG RETRIEVAL 10MM (BASKET) ×2
SYSTEM BAG RETRIEVAL 10MM (BASKET) ×1 IMPLANT
TOWEL OR 17X26 10 PK STRL BLUE (TOWEL DISPOSABLE) ×4 IMPLANT
TRAY FOLEY W/BAG SLVR 14FR (SET/KITS/TRAYS/PACK) ×2 IMPLANT
TRENDGUARD 450 HYBRID PRO PACK (MISCELLANEOUS) ×2
TROCAR 12M 150ML BLUNT (TROCAR) IMPLANT
TROCAR BALLN 12MMX100 BLUNT (TROCAR) ×2 IMPLANT
TROCAR BLADELESS 15MM (ENDOMECHANICALS) IMPLANT
TROCAR BLADELESS OPT 5 100 (ENDOMECHANICALS) ×4 IMPLANT
TROCAR XCEL 12X100 BLDLESS (ENDOMECHANICALS) IMPLANT
TROCAR XCEL DIL TIP R 11M (ENDOMECHANICALS) IMPLANT
TROCAR XCEL NON-BLD 11X100MML (ENDOMECHANICALS) IMPLANT
TUBING EVAC SMOKE HEATED PNEUM (TUBING) ×2 IMPLANT
WARMER LAPAROSCOPE (MISCELLANEOUS) ×2 IMPLANT

## 2018-08-30 NOTE — Transfer of Care (Signed)
Immediate Anesthesia Transfer of Care Note  Patient: Leslie Salazar  Procedure(s) Performed: LAPAROSCOPIC BILATERAL SALPINGO OOPHORECTOMY w/ PERITONEAL WASHINGS (Bilateral )  Patient Location: PACU  Anesthesia Type:General  Level of Consciousness: awake, oriented and patient cooperative  Airway & Oxygen Therapy: Patient Spontanous Breathing and Patient connected to nasal cannula oxygen  Post-op Assessment: Report given to RN and Post -op Vital signs reviewed and stable  Post vital signs: Reviewed and stable  Last Vitals:  Vitals Value Taken Time  BP 142/101 08/30/2018 10:10 AM  Temp    Pulse 99 08/30/2018 10:11 AM  Resp 19 08/30/2018 10:11 AM  SpO2 99 % 08/30/2018 10:11 AM  Vitals shown include unvalidated device data.  Last Pain:  Vitals:   08/30/18 0806  TempSrc:   PainSc: 0-No pain      Patients Stated Pain Goal: 7 (01/41/03 0131)  Complications: No apparent anesthesia complications

## 2018-08-30 NOTE — Discharge Instructions (Addendum)
Bilateral Salpingo-Oophorectomy, Care After This sheet gives you information about how to care for yourself after your procedure. Your health care provider may also give you more specific instructions. If you have problems or questions, contact your health care provider. What can I expect after the procedure? After the procedure, it is common to have:  Abdominal pain.  Some occasional vaginal bleeding (spotting).  Tiredness.  Symptoms of menopause, such as hot flashes, night sweats, or mood swings. Follow these instructions at home: Incision care   Keep your incision area and your bandage (dressing) clean and dry.  Follow instructions from your health care provider about how to take care of your incision. Make sure you: ? Wash your hands with soap and water before you change your dressing. If soap and water are not available, use hand sanitizer. ? Change your dressing as told by your health care provider. ? Leave stitches (sutures), staples, skin glue, or adhesive strips in place. These skin closures may need to stay in place for 2 weeks or longer. If adhesive strip edges start to loosen and curl up, you may trim the loose edges. Do not remove adhesive strips completely unless your health care provider tells you to do that.  Check your incision area every day for signs of infection. Check for: ? Redness, swelling, or pain. ? Fluid or blood. ? Warmth. ? Pus or a bad smell. Activity  Do not drive or use heavy machinery while taking prescription pain medicine.  Do not drive for 24 hours if you received a medicine to help you relax (sedative) during your procedure.  Take frequent, short walks throughout the day. Rest when you get tired. Ask your health care provider what activities are safe for you.  Avoid activity that requires great effort. Also, avoid heavy lifting. Do not lift anything that is heavier than 10 lbs. (4.5 kg), or the limit that your health care provider tells you,  until he or she says that it is safe to do so.  Do not douche, use tampons, or have sex until your health care provider approves. General instructions  To prevent or treat constipation while you are taking prescription pain medicine, your health care provider may recommend that you: ? Drink enough fluid to keep your urine clear or pale yellow. ? Take over-the-counter or prescription medicines. ? Eat foods that are high in fiber, such as fresh fruits and vegetables, whole grains, and beans. ? Limit foods that are high in fat and processed sugars, such as fried and sweet foods.  Take over-the-counter and prescription medicines only as told by your health care provider.  Do not take baths, swim, or use a hot tub until your health care provider approves. Ask your health care provider if you can take showers. You may only be allowed to take sponge baths for bathing.  Wear compression stockings as told by your health care provider. These stockings help to prevent blood clots and reduce swelling in your legs.  Keep all follow-up visits as told by your health care provider. This is important. Contact a health care provider if:  You have pain when you urinate.  You have pus or a bad smelling discharge coming from your vagina.  You have redness, swelling, or pain around your incision.  You have fluid or blood coming from your incision.  Your incision feels warm to the touch.  You have pus or a bad smell coming from your incision.  You have a fever.  Your incision  starts to break open.  You have pain in the abdomen, and it gets worse or does not get better when you take medicine.  You develop a rash.  You develop nausea and vomiting.  You feel lightheaded. Get help right away if:  You develop pain in your chest or leg.  You become short of breath.  You faint.  You have increased bleeding from your vagina. Summary  After the procedure, it is common to have pain, bleeding in  the vagina, and symptoms of menopause.  Follow instructions from your health care provider about how to take care of your incision.  Follow instructions from your health care provider about activities and restrictions.  Check your incision every day for signs of infection and report any symptoms to your health care provider. This information is not intended to replace advice given to you by your health care provider. Make sure you discuss any questions you have with your health care provider. Document Released: 03/29/2005 Document Revised: 07/08/2016 Document Reviewed: 05/03/2016 Elsevier Interactive Patient Education  2019 [place] Instructions  Activity: Get plenty of rest for the remainder of the day. A responsible individual must stay with you for 24 hours following the procedure.  For the next 24 hours, DO NOT: -Drive a car -Paediatric nurse -Drink alcoholic beverages -Take any medication unless instructed by your physician -Make any legal decisions or sign important papers.  Meals: Start with liquid foods such as gelatin or soup. Progress to regular foods as tolerated. Avoid greasy, spicy, heavy foods. If nausea and/or vomiting occur, drink only clear liquids until the nausea and/or vomiting subsides. Call your physician if vomiting continues.  Special Instructions/Symptoms: Your throat may feel dry or sore from the anesthesia or the breathing tube placed in your throat during surgery. If this causes discomfort, gargle with warm salt water. The discomfort should disappear within 24 hours.  If you had a scopolamine patch placed behind your ear for the management of post- operative nausea and/or vomiting:  1. The medication in the patch is effective for 72 hours, after which it should be removed.  Wrap patch in a tissue and discard in the trash. Wash hands thoroughly with soap and water. 2. You may remove the patch earlier than 72 hours if you  experience unpleasant side effects which may include dry mouth, dizziness or visual disturbances. 3. Avoid touching the patch. Wash your hands with soap and water after contact with the patch.

## 2018-08-30 NOTE — Anesthesia Procedure Notes (Signed)
Procedure Name: Intubation Date/Time: 08/30/2018 8:51 AM Performed by: Wanita Chamberlain, CRNA Pre-anesthesia Checklist: Timeout performed, Patient identified, Emergency Drugs available, Suction available and Patient being monitored Patient Re-evaluated:Patient Re-evaluated prior to induction Oxygen Delivery Method: Circle system utilized Preoxygenation: Pre-oxygenation with 100% oxygen Induction Type: IV induction Ventilation: Mask ventilation without difficulty Laryngoscope Size: Mac and 3 Grade View: Grade I Tube type: Oral Number of attempts: 1 Airway Equipment and Method: Stylet Placement Confirmation: breath sounds checked- equal and bilateral,  CO2 detector,  positive ETCO2 and ETT inserted through vocal cords under direct vision Secured at: 21 cm Tube secured with: Tape Dental Injury: Teeth and Oropharynx as per pre-operative assessment

## 2018-08-30 NOTE — H&P (Signed)
Leslie Salazar is an 53 y.o. female.   RP:  LPS Peritoneal washings/BSO for Complex Left Ovarian Cyst  HPI: No change x 06/23/2018.  Menopause, well on no HRT, no PMB. No pelvic pain. Rt Breast Infiltrating Ductal Ca, ER-PR positive. Finished Tamoxifen x 6 months. BrCa1-2 negative. Last Pelvic US 06/16/2017 Left simple ovarian cyst 3 x 2.7 x 2.7 cm. Ca 125 05/25/2017 was normal at 11.  Pertinent Gynecological History: Menses: post-menopausal Contraception: post menopausal status Blood transfusions: none Sexually transmitted diseases: no past history Last pap: 05/2018 Negative OB History: G1P1L1   Menstrual History: No LMP recorded. Patient is postmenopausal.    Past Medical History:  Diagnosis Date  . Acne   . Cancer ([place]) 2012   RIGHT DUCTAL CARCINOMA INSITU..  . Complication of anesthesia   . Condyloma    ON BUTTOCKS  . 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 Fibroids   . Heart palpitations   . High cholesterol   . History of angina   . HSV-1 (herpes simplex virus 1) infection   . Hx of radiation therapy 2013   from april to may - six weeks  . Hypertension   . Left ovarian cyst   . PONV (postoperative nausea and vomiting)    nausea no vomiting  . Scoliosis   . Urinary urgency   . Wears glasses     Past Surgical History:  Procedure Laterality Date  . BREAST RECONSTRUCTION WITH PLACEMENT OF TISSUE EXPANDER AND FLEX HD (ACELLULAR HYDRATED DERMIS) Right 11/16/2012   Procedure: RIGHT LATISSIMUS myocutaneous FLAP WITH PLACEMENT OF TISSUE EXPANDER  TO RIGHT BREAST;  Surgeon: Theodoro Kos, DO;  Location: [facility];  Service: Plastics;  Laterality: Right;  . CARDIAC CATHETERIZATION N/A 02/28/2015   Procedure: Left Heart Cath and Coronary Angiography;  Surgeon: Adrian Prows, MD;  Location: [facility];  Service: Cardiovascular;  Laterality: N/A;  . CESAREAN SECTION  2000  . ENDOMETRIAL ABLATION W/ NOVASURE  2007  . INSERTION OF TISSUE  EXPANDER AFTER MASTECTOMY Bilateral 04/20/2011   Breast Ca (r) T1b N0 M0 stage IA infiltrating ductal carcinoma  . LASER ABLATION OF THE CERVIX  1994   FOR DYSPLASIA  . LIPOSUCTION WITH LIPOFILLING Right 03/14/2013   Procedure: LIPOSUCTION WITH LIPOFILLING;  Surgeon: Theodoro Kos, DO;  Location: [facility];  Service: Plastics;  Laterality: Right;  . RECONSTRUCTION BREAST W/ LATISSIMUS DORSI FLAP Right 11/16/2012  . REMOVAL OF TISSUE EXPANDER AND PLACEMENT OF IMPLANT Right 03/14/2013   Procedure: REMOVAL OF RIGHT BREAST TISSUE EXPANDER WITH PLACEMENT OF RIGHT BREAST IMPLANT;  Surgeon: Theodoro Kos, DO;  Location: [facility];  Service: Plastics;  Laterality: Right;  Removal of Right Breast Tissue Expander with Placement of Right Breast Implant, Possible Lipsuction with Lipofilling  . TISSUE EXPANDER PLACEMENT Right 11/16/2012  . TISSUE EXPANDER REMOVAL Bilateral 12/2011  . TUBAL LIGATION  09/08/2001   BY LAPAROSCOPY ,, HULKA CLIPS TECHNIQUE  . WISDOM TOOTH EXTRACTION      Family History  Problem Relation Age of Onset  . Colon cancer Paternal Aunt   . Diabetes Mother        HAS HAD A KIDNEY TRANSPLATN  . Hypertension Mother   . Kidney failure Mother   . Hypertension Father   . Gallstones Father   . Gallstones Sister     Social History:  reports that she has never smoked. She has never used smokeless tobacco.  She reports current alcohol use of about 1.0 standard drinks of alcohol per week. She reports that she does not use drugs.  Allergies: No Known Allergies  Medications Prior to Admission  Medication Sig Dispense Refill Last Dose  . aspirin EC 81 MG tablet Take 81 mg by mouth every morning.   Past Week at Unknown time  . atorvastatin (LIPITOR) 20 MG tablet TAKE 1 TABLET BY MOUTH DAILY 90 tablet 3 08/30/2018 at 0430  . Calcium Citrate-Vitamin D (CITRACAL + D PO) Take 1 tablet by mouth 2 (two) times daily. '900mg'$  Calcium-1000iu Vitamin D   Past Week at Unknown  time  . Cholecalciferol (VITAMIN D-1000 MAX ST) 1000 units tablet Take 1 tablet by mouth daily. 2400 mg daily   Past Week at Unknown time  . Coenzyme Q10 100 MG capsule Take 1 capsule by mouth daily.   Past Week at Unknown time  . hydrochlorothiazide (MICROZIDE) 12.5 MG capsule TAKE 1 CAPSULE BY MOUTH EVERY DAY (Patient taking differently: every morning. ) 90 capsule 1 08/29/2018 at Unknown time  . loratadine (CLARITIN) 10 MG tablet Take 10 mg by mouth every morning.    08/29/2018 at Unknown time  . metoprolol succinate (TOPROL-XL) 25 MG 24 hr tablet TAKE 1 TABLET BY MOUTH DAILY 90 tablet 1 08/30/2018 at 0430  . Multiple Vitamins-Minerals (MULTIVITAMIN WOMENS 50+ ADV PO) Take by mouth.   Past Week at Unknown time  . Omega-3 Fatty Acids (FISH OIL PO) Take by mouth.   Past Week at Unknown time  . POTASSIUM GLUCONATE PO Take 550 mg by mouth every morning.    Past Week at Unknown time  . valACYclovir (VALTREX) 500 MG tablet Take 1 tablet (500 mg total) by mouth daily. (Patient taking differently: Take 500 mg by mouth every morning. ) 90 tablet 4 08/30/2018 at 0430  . hydrocortisone (ANUSOL-HC) 2.5 % rectal cream Place 1 application rectally 2 (two) times daily. 30 g 3 More than a month at Unknown time    REVIEW OF SYSTEMS: A ROS was performed and pertinent positives and negatives are included in the history.  GENERAL: No fevers or chills. HEENT: No change in vision, no earache, sore throat or sinus congestion. NECK: No pain or stiffness. CARDIOVASCULAR: No chest pain or pressure. No palpitations. PULMONARY: No shortness of breath, cough or wheeze. GASTROINTESTINAL: No abdominal pain, nausea, vomiting or diarrhea, melena or bright red blood per rectum. GENITOURINARY: No urinary frequency, urgency, hesitancy or dysuria. MUSCULOSKELETAL: No joint or muscle pain, no back pain, no recent trauma. DERMATOLOGIC: No rash, no itching, no lesions. ENDOCRINE: No polyuria, polydipsia, no heat or cold intolerance. No recent  change in weight. HEMATOLOGICAL: No anemia or easy bruising or bleeding. NEUROLOGIC: No headache, seizures, numbness, tingling or weakness. PSYCHIATRIC: No depression, no loss of interest in normal activity or change in sleep pattern.     Blood pressure 115/78, pulse 61, temperature 98.4 F (36.9 C), temperature source Oral, resp. rate 14, height '5\' 2"'$  (1.575 m), weight 67.1 kg, SpO2 100 %.  Physical Exam:  See office notes   Results for orders placed or performed during the hospital encounter of 08/30/18 (from the past 24 hour(s))  Pregnancy, urine POC     Status: None   Collection Time: 08/30/18  7:42 AM  Result Value Ref Range   Preg Test, Ur NEGATIVE NEGATIVE   Pelvic US 05/29/2018: T/V images. Uterus anteverted heterogeneous measuring 7.33 x 4.77 x 3.21 cm. Intramural fibroid measured at 1.5 cm with calcifications  and subserous left at 2.1 x 1.6 cm. Endometrial lining is thin at 2.3 mm. Right ovary normal. Left ovary with a rim of tissue with normal color flow Doppler. Thin-walled echo-free cystic mass on theleft ovary measuring 3.4 x 3.1 x 2.9 cm, mean diameter at 3.1 cm, negative color flow Doppler. Echogenic focus in the wall of the cyst measuring 4 mm with negative color flow Doppler. Small cyst adjacent to the larger cyst measuring 9 x 8 mm. No free fluid in the posterior cul-de-sac.  05/29/2018: Ca 125 normal at 8    Assessment/Plan:  53 y.o. G1P1001   1. Complex cyst of left ovary Pelvic US findings thoroughly reviewed with patient. Given the changesin the left ovarian cyst including a small increase in size at 3.4 x 3.1 x 2.1 cm now and also the echogenic focus on the wall of the cyst measuring 4 mm and a small cyst adjacent to the larger cyst measuring 9 x 8 mm,as well as the history of infiltrating ductal carcinoma of the right breast with estrogen receptor positive status,decision to proceed with LPS Peritoneal washings/BSO.A Ca125 was repeated normal at 8.  Information given on the surgical procedure. Patient's questions answered.  Preop management, the procedure risks and benefits,as well as the postop management discussed thoroughly.  2. Menopausal and perimenopausal disorder On no HRT.  3. Infiltrating ductal carcinoma of right breast, stage 1 ([place]) ER positive. Tomoxifen finished 9 months ago.  BrCa 1-2 negative.  Marie-Lyne Steph Cheadle 08/30/2018, 8:34 AM

## 2018-08-30 NOTE — Anesthesia Preprocedure Evaluation (Addendum)
Anesthesia Evaluation  Patient identified by MRN, date of birth, ID band Patient awake    Reviewed: Allergy & Precautions, NPO status , Patient's Chart, lab work & pertinent test results  History of Anesthesia Complications (+) PONV  Airway Mallampati: I  TM Distance: >3 FB Neck ROM: Full    Dental  (+) Teeth Intact, Dental Advisory Given   Pulmonary neg pulmonary ROS,    breath sounds clear to auscultation       Cardiovascular hypertension, (-) angina Rhythm:Regular Rate:Normal     Neuro/Psych negative neurological ROS     GI/Hepatic negative GI ROS, Neg liver ROS,   Endo/Other  negative endocrine ROS  Renal/GU negative Renal ROS     Musculoskeletal negative musculoskeletal ROS (+)   Abdominal Normal abdominal exam  (+)   Peds  Hematology negative hematology ROS (+)   Anesthesia Other Findings   Reproductive/Obstetrics                           Lab Results  Component Value Date   WBC 9.1 08/25/2018   HGB 13.2 08/25/2018   HCT 39.0 08/25/2018   MCV 92.0 08/25/2018   PLT 237 08/25/2018   Lab Results  Component Value Date   CREATININE 0.63 08/25/2018   BUN 15 08/25/2018   NA 138 08/25/2018   K 3.6 08/25/2018   CL 105 08/25/2018   CO2 24 08/25/2018   No results found for: INR, PROTIME   Anesthesia Physical Anesthesia Plan  ASA: II  Anesthesia Plan: General   Post-op Pain Management:    Induction: Intravenous  PONV Risk Score and Plan: 4 or greater and Ondansetron, Dexamethasone, Midazolam and Scopolamine patch - Pre-op  Airway Management Planned: Oral ETT  Additional Equipment: None  Intra-op Plan:   Post-operative Plan: Extubation in OR  Informed Consent: I have reviewed the patients History and Physical, chart, labs and discussed the procedure including the risks, benefits and alternatives for the proposed anesthesia with the patient or authorized  representative who has indicated his/her understanding and acceptance.     Dental advisory given  Plan Discussed with: CRNA  Anesthesia Plan Comments:        Anesthesia Quick Evaluation

## 2018-08-30 NOTE — Op Note (Signed)
Operative Note  08/30/2018  9:58 AM  PATIENT:  Leslie Salazar  53 y.o. female  PRE-OPERATIVE DIAGNOSIS:  Left ovarian cyst, breast cancer ER positive  POST-OPERATIVE DIAGNOSIS:  Left ovarian cyst, breast cancer ER positive  PROCEDURE:  Procedure(s): LAPAROSCOPIC BILATERAL SALPINGO OOPHORECTOMY w/ PERITONEAL WASHINGS  SURGEON:  Surgeon(s): Princess Bruins, MD Fontaine, Belinda Block, MD  ANESTHESIA:   general  FINDINGS: Uterus with a small subserosal myoma, s/p bilateral tubal sterilization with Filshie clips, left ovarian cyst about 4 cm, right ovary normal.  DESCRIPTION OF OPERATION: Under general anesthesia with endotracheal intubation the patient is in lithotomy position.  She is prepped with DuraPrep on the abdomen and with Betadine on the suprapubic, vulvar and vaginal areas.  She is draped as usual.  A Foley is inserted in the bladder.  Timeout is done.  Patient tested coronavirus negative.  The vaginal exam reveals an anteverted uterus, normal volume, mobile.  Right adnexa normal/4-5 cm cyst in the left adnexa.  The speculum is inserted in the vagina.  Cervix is normal.  The tenaculum is applied on the anterior lip of the cervix and the acorn uterine cannula is inserted and hooked to the tenaculum.  The speculum is removed.  We go to the abdomen.      Marcaine one quarter plain is infiltrated at the infraumbilical area.  A 1.5 cm incision is done at the infra umbilical area with the scalpel.  The aponeurosis is grasped with Coker's.  The aponeurosis is open with Mayo scissors under direct vision.  The parietal peritoneum is grasped and opened under direct vision with Mayo scissors.  A pursestring stitch of Vicryl 0 was done on the aponeurosis.  The Sheryle Hail is inserted under direct vision.  The camera is inserted in that port.  Inspection of the abdomen is normal.  Inspection of the pelvis reveals a small 1 cm subserosal myoma at the uterine fundus.  Both tubes are status post tubal  sterilization with Filshie clips.  The right ovary is completely normal in size and appearance.  The left ovary presents a small about 4-5 cm cyst which has benign characteristics, the surface of the left ovary is smooth and there is no increased vascularity.  A 5 mm incision is done with the scalpel in the lower abdomen on the right and left.  5 mm ports are entered under direct vision at both levels starting on the right than on the left.  Pictures are taken of the liver and appendix.  Pictures are taken of the uterus, tubes and ovaries.  Both ureters were seen in normal anatomy position with peristalsis.  Peritoneal washings are done.  We start on the right side with the harmonic scalpel cauterizing and sectioning the right infundibulopelvic ligament.  We continue to catheterize and section at the inferior aspect of the right ovary.  We finished with cauterization and section of the right utero-ovarian ligament and right proximal tube.  The right tube and ovary are completely detached and put in the posterior cul-de-sac.  We proceeded exactly the same way on the left side.  The left ovarian cyst is not ruptured.  We switched to a 5 mm camera in the right 5 mm port.  We used a 10 cm Endobag.  Both specimen consisting of the right tube and ovary and left tube and ovary are put in the bag and removed from the abdominal pelvic cavities.  Both specimens are sent together to pathology.  The left ovarian cyst was  ruptured in the bag without spillage to remove the specimen from the abdomen.  The Sheryle Hail is put back in place.  We go back to inspect the pelvic cavity.  We irrigated and suction the pelvic cavity.  Hemostasis is adequate at all levels.  Pictures are taken.  All laparoscopic instruments are removed.  The ports are removed under direct vision.  The CO2 is evacuated.  The infraumbilical incision is closed by attaching the pursestring stitch.  All incisions are closed with separate stitches of Monocryl 4-0 at the  skin.  Dermabond is added on the 3 incisions.  Hemostasis is adequate.  The tenaculum and acorn are removed from the vagina.  The Foley is removed in the OR.  The patient is brought to recovery room in good and stable status.  ESTIMATED BLOOD LOSS: 5 mL   Intake/Output Summary (Last 24 hours) at 08/30/2018 0958 Last data filed at 08/30/2018 0948 Gross per 24 hour  Intake -  Output 55 ml  Net -55 ml     BLOOD ADMINISTERED:none   LOCAL MEDICATIONS USED:  MARCAINE     SPECIMEN:  Source of Specimen:  Peritoneal washings/Bilateral tubes and ovaries  DISPOSITION OF SPECIMEN:  PATHOLOGY  COUNTS:  YES  PLAN OF CARE: Transfer to PACU  Marie-Lyne LavoieMD9:58 AM

## 2018-08-30 NOTE — Progress Notes (Signed)
Postop Percocet prescription #12 sent to pharmacy.

## 2018-08-31 ENCOUNTER — Encounter (HOSPITAL_BASED_OUTPATIENT_CLINIC_OR_DEPARTMENT_OTHER): Payer: Self-pay | Admitting: Obstetrics & Gynecology

## 2018-08-31 NOTE — Anesthesia Postprocedure Evaluation (Signed)
Anesthesia Post Note  Patient: Leslie Salazar  Procedure(s) Performed: LAPAROSCOPIC BILATERAL SALPINGO OOPHORECTOMY w/ PERITONEAL WASHINGS (Bilateral )     Patient location during evaluation: PACU Anesthesia Type: General Level of consciousness: awake and alert Pain management: pain level controlled Vital Signs Assessment: post-procedure vital signs reviewed and stable Respiratory status: spontaneous breathing, nonlabored ventilation, respiratory function stable and patient connected to nasal cannula oxygen Cardiovascular status: blood pressure returned to baseline and stable Postop Assessment: no apparent nausea or vomiting Anesthetic complications: no    Last Vitals:  Vitals:   08/30/18 1145 08/30/18 1245  BP: 115/78 132/71  Pulse: 69 73  Resp: 14 16  Temp:  36.8 C  SpO2: 93% 99%                 Effie Berkshire

## 2018-09-11 ENCOUNTER — Telehealth: Payer: Self-pay

## 2018-09-11 NOTE — Telephone Encounter (Addendum)
Patient had surgery May 20th. She said that she has a hard lump under her skin "mid-incision site" that has been there since last Thursday.  She cannot see but can feel it. No fever, no redness, no pain. Looks normal she can just feel it when she rubs over incision.  What to rec?

## 2018-09-12 NOTE — Telephone Encounter (Signed)
Recommend not touching, will verify at 3 wks postop visit.  Call back for earlier evaluation if signs of infection, drainage of fluid or pain.  May just be taking more time to heal completely at that location with some induration.

## 2018-09-12 NOTE — Telephone Encounter (Signed)
Spoke with patient and read her Dr. Mariah Milling reply. Will call prn.

## 2018-09-20 ENCOUNTER — Telehealth: Payer: Self-pay

## 2018-09-20 DIAGNOSIS — M67431 Ganglion, right wrist: Secondary | ICD-10-CM | POA: Diagnosis not present

## 2018-09-20 NOTE — Telephone Encounter (Signed)
Pt called stated she no longer needs to be seen, she went to see ortho for the problem she was having 09/20/2018

## 2018-09-21 ENCOUNTER — Other Ambulatory Visit: Payer: Self-pay

## 2018-09-22 ENCOUNTER — Ambulatory Visit (INDEPENDENT_AMBULATORY_CARE_PROVIDER_SITE_OTHER): Payer: Federal, State, Local not specified - PPO | Admitting: Obstetrics & Gynecology

## 2018-09-22 ENCOUNTER — Encounter: Payer: Self-pay | Admitting: Obstetrics & Gynecology

## 2018-09-22 VITALS — BP 122/80

## 2018-09-22 DIAGNOSIS — Z09 Encounter for follow-up examination after completed treatment for conditions other than malignant neoplasm: Secondary | ICD-10-CM

## 2018-09-22 NOTE — Progress Notes (Signed)
    LEIGHANNE ADOLPH 1965/05/10 [id number]        53 y.o.  G1P1001 Married  RP: Postop 3 wks LPS BSO/Peritoneal washings  HPI: Good postop.  No pelvic pain.  No vaginal bleeding.  No vaginal discharge.  Incisions healing well.  Urine and bowel movements normal.  No fever.   OB History  Gravida Para Term Preterm AB Living  1 1 1     1   SAB TAB Ectopic Multiple Live Births          1    # Outcome Date GA Lbr Len/2nd Weight Sex Delivery Anes PTL Lv  1 Term     M CS-Unspec  N LIV    Past medical history,surgical history, problem list, medications, allergies, family history and social history were all reviewed and documented in the EPIC chart.   Directed ROS with pertinent positives and negatives documented in the history of present illness/assessment and plan.  Exam:  Vitals:   09/22/18 1532  BP: 122/80   General appearance:  Normal  Abdomen: Incisions healing well.  Gynecologic exam: Vulva normal.  Bimanual exam:  Uterus AV, normal volume, mobile, NT.  No adnexal mass, NT bilaterally.  Patho 08/30/18: Diagnosis  Ovaries and fallopian tubes, bilateral  - OVARY WITH BENIGN SEROUS CYST (3.2 CM)  - BENIGN OVARY  - BENIGN BILATERAL FALLOPIAN TUBES Peritoneal washings benign   Assessment/Plan:  53 y.o. G1P1001   1. Status post gynecological surgery, follow-up exam Excellent postop evolution without complication.  Pathology benign.  Patient reassured that she can resume all physical activities.  Follow-up annual gynecologic exam.  Princess Bruins MD, 3:46 PM 09/22/2018

## 2018-09-29 ENCOUNTER — Encounter: Payer: Self-pay | Admitting: Obstetrics & Gynecology

## 2018-09-29 NOTE — Patient Instructions (Signed)
1. Status post gynecological surgery, follow-up exam Excellent postop evolution without complication.  Pathology benign.  Patient reassured that she can resume all physical activities.  Follow-up annual gynecologic exam.  Leslie Salazar, it was a pleasure seeing you today!

## 2018-10-16 ENCOUNTER — Other Ambulatory Visit: Payer: Self-pay | Admitting: Internal Medicine

## 2018-10-24 ENCOUNTER — Telehealth: Payer: Self-pay

## 2018-10-24 NOTE — Telephone Encounter (Signed)
Patient called because she is needing medical records to be able to file her [employer].  I advised her how to go online and print medical records form. I told her I do not handle Dr. Marguerita Merles medical records but when she faxed is back to put attention [place] and she will assist her with getting the records she needs.

## 2019-02-22 ENCOUNTER — Other Ambulatory Visit: Payer: Self-pay

## 2019-02-22 ENCOUNTER — Encounter: Payer: Self-pay | Admitting: Internal Medicine

## 2019-02-22 ENCOUNTER — Ambulatory Visit (INDEPENDENT_AMBULATORY_CARE_PROVIDER_SITE_OTHER): Payer: Federal, State, Local not specified - PPO | Admitting: Internal Medicine

## 2019-02-22 VITALS — BP 114/66 | HR 71 | Temp 98.9°F | Ht 62.0 in | Wt 153.0 lb

## 2019-02-22 DIAGNOSIS — N952 Postmenopausal atrophic vaginitis: Secondary | ICD-10-CM | POA: Diagnosis not present

## 2019-02-22 DIAGNOSIS — I10 Essential (primary) hypertension: Secondary | ICD-10-CM

## 2019-02-22 DIAGNOSIS — Z6827 Body mass index (BMI) 27.0-27.9, adult: Secondary | ICD-10-CM

## 2019-02-22 DIAGNOSIS — E663 Overweight: Secondary | ICD-10-CM | POA: Diagnosis not present

## 2019-02-22 MED ORDER — HYDROCHLOROTHIAZIDE 12.5 MG PO CAPS: ORAL_CAPSULE | ORAL | 1 refills | Status: DC

## 2019-02-22 MED ORDER — ATORVASTATIN CALCIUM 20 MG PO TABS: 20.0000 mg | ORAL_TABLET | Freq: Every day | ORAL | 31 refills | Status: DC

## 2019-02-22 MED ORDER — METOPROLOL SUCCINATE ER 25 MG PO TB24: 25.0000 mg | ORAL_TABLET | Freq: Every day | ORAL | 1 refills | Status: DC

## 2019-02-22 NOTE — Patient Instructions (Signed)
Ginger and Turmeric tea  Try chicken/bone broth  Dinner: meat or fish and veggies ONLY

## 2019-02-23 LAB — CMP14+EGFR
ALT: 21 IU/L (ref 0–32)
AST: 21 IU/L (ref 0–40)
Albumin/Globulin Ratio: 2.1 (ref 1.2–2.2)
Albumin: 4.6 g/dL (ref 3.8–4.9)
Alkaline Phosphatase: 80 IU/L (ref 39–117)
BUN/Creatinine Ratio: 18 (ref 9–23)
BUN: 13 mg/dL (ref 6–24)
Bilirubin Total: 0.9 mg/dL (ref 0.0–1.2)
CO2: 28 mmol/L (ref 20–29)
Calcium: 10 mg/dL (ref 8.7–10.2)
Chloride: 105 mmol/L (ref 96–106)
Creatinine, Ser: 0.72 mg/dL (ref 0.57–1.00)
GFR calc Af Amer: 111 mL/min/{1.73_m2} (ref >59)
GFR calc non Af Amer: 97 mL/min/{1.73_m2} (ref >59)
Globulin, Total: 2.2 g/dL (ref 1.5–4.5)
Glucose: 78 mg/dL (ref 65–99)
Potassium: 4.1 mmol/L (ref 3.5–5.2)
Sodium: 144 mmol/L (ref 134–144)
Total Protein: 6.8 g/dL (ref 6.0–8.5)

## 2019-02-23 NOTE — Progress Notes (Signed)
Subjective:     Patient ID: Leslie Salazar , female    DOB: Sep 27, 1965 , 53 y.o.   MRN: [id number]   Chief Complaint  Patient presents with  . Hypertension    HPI  Hypertension This is a chronic problem. The current episode started more than 1 year ago. The problem has been rapidly improving since onset. The problem is controlled. Pertinent negatives include no blurred vision, chest pain, headaches, neck pain, orthopnea or shortness of breath. Palpitations: states she had episode of palpitations - occurred about a month ago - reports had several cups of coffee that day, and one right before walking with her friend. no associated cp. no repeat occurrences.  Risk factors for coronary artery disease include post-menopausal state. The current treatment provides moderate improvement. There are no compliance problems.      Past Medical History:  Diagnosis Date  . Acne   . Cancer ([place]) 2012   RIGHT DUCTAL CARCINOMA INSITU..  . Complication of anesthesia   . Condyloma    ON BUTTOCKS  . 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 Fibroids   . Heart palpitations   . High cholesterol   . History of angina   . HSV-1 (herpes simplex virus 1) infection   . Hx of radiation therapy 2013   from april to may - six weeks  . Hypertension   . Left ovarian cyst   . PONV (postoperative nausea and vomiting)    nausea no vomiting  . Scoliosis   . Urinary urgency   . Wears glasses      Family History  Problem Relation Age of Onset  . Colon cancer Paternal Aunt   . Diabetes Mother        HAS HAD A KIDNEY TRANSPLATN  . Hypertension Mother   . Kidney failure Mother   . Hypertension Father   . Gallstones Father   . Gallstones Sister      Current Outpatient Medications:  .  aspirin EC 81 MG tablet, Take 81 mg by mouth every morning., Disp: , Rfl:  .  atorvastatin (LIPITOR) 20 MG tablet, Take 1 tablet (20 mg total) by mouth daily., Disp: 90 tablet, Rfl: 31 .   Calcium Citrate-Vitamin D (CITRACAL + D PO), Take 1 tablet by mouth 2 (two) times daily. 936m Calcium-1000iu Vitamin D, Disp: , Rfl:  .  Cholecalciferol (VITAMIN D-1000 MAX ST) 1000 units tablet, Take 1 tablet by mouth daily. 2400 mg daily, Disp: , Rfl:  .  Coenzyme Q10 100 MG capsule, Take 1 capsule by mouth daily., Disp: , Rfl:  .  hydrochlorothiazide (MICROZIDE) 12.5 MG capsule, TAKE 1 CAPSULE BY MOUTH EVERY DAY, Disp: 90 capsule, Rfl: 1 .  hydrocortisone (ANUSOL-HC) 2.5 % rectal cream, Place 1 application rectally 2 (two) times daily., Disp: 30 g, Rfl: 3 .  loratadine (CLARITIN) 10 MG tablet, Take 10 mg by mouth every morning. , Disp: , Rfl:  .  metoprolol succinate (TOPROL-XL) 25 MG 24 hr tablet, Take 1 tablet (25 mg total) by mouth daily., Disp: 90 tablet, Rfl: 1 .  Multiple Vitamins-Minerals (MULTIVITAMIN WOMENS 50+ ADV PO), Take by mouth., Disp: , Rfl:  .  Omega-3 Fatty Acids (FISH OIL PO), Take by mouth., Disp: , Rfl:  .  POTASSIUM GLUCONATE PO, Take 550 mg by mouth every morning. 2 times per week, Disp: , Rfl:  .  Specialty Vitamins Products (JOINT/BONE VITALITY PO), , Disp: , Rfl:  .  valACYclovir (VALTREX) 500 MG tablet, Take 1 tablet (500 mg total) by mouth daily. (Patient taking differently: Take 500 mg by mouth every morning. ), Disp: 90 tablet, Rfl: 4   No Known Allergies   Review of Systems  Constitutional: Positive for unexpected weight change.  Eyes: Negative for blurred vision.  Respiratory: Negative.  Negative for shortness of breath.   Cardiovascular: Negative for chest pain and orthopnea. Palpitations: states she had episode of palpitations - occurred about a month ago - reports had several cups of coffee that day, and one right before walking with her friend. no associated cp. no repeat occurrences.   Gastrointestinal: Negative.   Genitourinary:       She has vaginal dryness. Recently had oophorectomy.   Musculoskeletal: Negative for neck pain.  Neurological:  Negative.  Negative for headaches.  Psychiatric/Behavioral: Negative.      Today's Vitals   02/22/19 1042  BP: 114/66  Pulse: 71  Temp: 98.9 F (37.2 C)  TempSrc: Oral  Weight: 153 lb (69.4 kg)  Height: _0  (1.575 m)   Body mass index is 27.98 kg/m.   Objective:  Physical Exam Vitals signs and nursing note reviewed.  Constitutional:      Appearance: Normal appearance.  HENT:     Head: Normocephalic and atraumatic.  Cardiovascular:     Rate and Rhythm: Normal rate and regular rhythm.     Heart sounds: Normal heart sounds.  Pulmonary:     Effort: Pulmonary effort is normal.     Breath sounds: Normal breath sounds.  Skin:    General: Skin is warm.  Neurological:     General: No focal deficit present.     Mental Status: She is alert.  Psychiatric:        Mood and Affect: Mood normal.        Behavior: Behavior normal.         Assessment And Plan:     1. Essential hypertension, benign  Chronic, well controlled. She will continue with current meds. I will check renal function today. She will rto in six months for a full physical examination.   - CMP14+EGFR  2. Postmenopausal atrophic vaginitis  She will continue with vitamin E suppositories as needed. She also plans to consider an OTC product. She will let me know if her sx persist/worsen.  3. Overweight with body mass index (BMI) of 27 to 27.9 in adult  She is aware of six pound weight gain since May 2020.  She is encouraged to cut back on her refined carbs, especially with her evening meals. She will continue with her regular exercise regimen. She is also encouraged to incorporate more activity into her daily routine since she is now working from home.   Maximino Greenland, MD    THE PATIENT IS ENCOURAGED TO PRACTICE SOCIAL DISTANCING DUE TO THE COVID-19 PANDEMIC.

## 2019-04-17 ENCOUNTER — Other Ambulatory Visit: Payer: Self-pay

## 2019-04-17 ENCOUNTER — Ambulatory Visit (INDEPENDENT_AMBULATORY_CARE_PROVIDER_SITE_OTHER): Payer: Federal, State, Local not specified - PPO | Admitting: Obstetrics & Gynecology

## 2019-04-17 ENCOUNTER — Encounter: Payer: Self-pay | Admitting: Obstetrics & Gynecology

## 2019-04-17 VITALS — BP 116/70

## 2019-04-17 DIAGNOSIS — N898 Other specified noninflammatory disorders of vagina: Secondary | ICD-10-CM | POA: Diagnosis not present

## 2019-04-17 DIAGNOSIS — Z90722 Acquired absence of ovaries, bilateral: Secondary | ICD-10-CM

## 2019-04-17 DIAGNOSIS — N952 Postmenopausal atrophic vaginitis: Secondary | ICD-10-CM | POA: Diagnosis not present

## 2019-04-17 DIAGNOSIS — C50911 Malignant neoplasm of unspecified site of right female breast: Secondary | ICD-10-CM

## 2019-04-17 LAB — WET PREP FOR TRICH, YEAST, CLUE

## 2019-04-17 MED ORDER — ESTRADIOL 10 MCG VA TABS: 1.0000 | ORAL_TABLET | VAGINAL | 4 refills | Status: DC

## 2019-04-17 NOTE — Telephone Encounter (Signed)
Patient coming in today for office visit. 

## 2019-04-17 NOTE — Progress Notes (Signed)
    Leslie Salazar 05/09/1965 [id number]        53 y.o.  G1P1L1  Married  RP: Vaginal dryness and irritation  HPI:  C/O vaginal and vulvar dryness worsening x LPS BSO 08/30/2018.  Pain with IC.  Has tried coconut oil and a moisturizer x 2 weeks without success.  Rt Breast Infiltrating Ductal Ca, ER-PR positive.  Finished Tamoxifen x 1 1/2 year.  BrCa1-2 negative. LPS BSO 08/30/2018.  Patho Benign Serous Ovarian Cyst.  Menopause, no PMB.  No pelvic pain.   OB History  Gravida Para Term Preterm AB Living  '1 1 1     1  '$ SAB TAB Ectopic Multiple Live Births          1    # Outcome Date GA Lbr Len/2nd Weight Sex Delivery Anes PTL Lv  1 Term     M CS-Unspec  N LIV    Past medical history,surgical history, problem list, medications, allergies, family history and social history were all reviewed and documented in the EPIC chart.   Directed ROS with pertinent positives and negatives documented in the history of present illness/assessment and plan.  Exam:  Vitals:   04/17/19 1525  BP: 116/70   General appearance:  Normal  Gynecologic exam: Vulva and vagina with postmenopausal atrophic vaginitis.  No other lesion.  Speculum exam reveals normal cervix and vaginal secretions, wet prep done.  Wet prep negative   Assessment/Plan:  54 y.o. G1P1001   1. Vaginal irritation Wet prep negative, patient reassured. - WET PREP FOR [place], YEAST, CLUE  2. 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  Given the severity of her symptoms which were not alleviated with coconut oil and lubricants, decision to start estradiol vaginal tablets 10 mcg twice a week.  Risks, benefits and usage thoroughly reviewed.  Patient voiced understanding and agreement with plan.  Prescription sent to pharmacy.  3. S/P BSO (bilateral salpingo-oophorectomy)  4. Infiltrating ductal carcinoma of right breast, stage  1 (HCC) S/P Bilateral Mastectomy.  Other orders - Estradiol 10 MCG TABS vaginal tablet; Place 1 tablet (10 mcg total) vaginally 2 (two) times a week.  Counseling on above issues and coordination of care more than 50% for 25 minutes.  Princess Bruins MD, 3:48 PM 04/17/2019

## 2019-04-22 ENCOUNTER — Encounter: Payer: Self-pay | Admitting: Obstetrics & Gynecology

## 2019-04-22 NOTE — Patient Instructions (Signed)
1. Vaginal irritation Wet prep negative, patient reassured. - WET PREP FOR [place], YEAST, CLUE  2. 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  Given the severity of her symptoms which were not alleviated with coconut oil and lubricants, decision to start estradiol vaginal tablets 10 mcg twice a week.  Risks, benefits and usage thoroughly reviewed.  Patient voiced understanding and agreement with plan.  Prescription sent to pharmacy.  3. S/P BSO (bilateral salpingo-oophorectomy)  4. Infiltrating ductal carcinoma of right breast, stage 1 (HCC) S/P Bilateral Mastectomy.  Other orders - Estradiol 10 MCG TABS vaginal tablet; Place 1 tablet (10 mcg total) vaginally 2 (two) times a week.  [name], it was a pleasure seeing you today!

## 2019-04-26 DIAGNOSIS — R635 Abnormal weight gain: Secondary | ICD-10-CM | POA: Diagnosis not present

## 2019-04-26 DIAGNOSIS — Z08 Encounter for follow-up examination after completed treatment for malignant neoplasm: Secondary | ICD-10-CM | POA: Diagnosis not present

## 2019-04-26 DIAGNOSIS — N898 Other specified noninflammatory disorders of vagina: Secondary | ICD-10-CM | POA: Diagnosis not present

## 2019-04-26 DIAGNOSIS — N959 Unspecified menopausal and perimenopausal disorder: Secondary | ICD-10-CM | POA: Diagnosis not present

## 2019-04-26 DIAGNOSIS — I10 Essential (primary) hypertension: Secondary | ICD-10-CM | POA: Diagnosis not present

## 2019-04-26 DIAGNOSIS — Z853 Personal history of malignant neoplasm of breast: Secondary | ICD-10-CM | POA: Diagnosis not present

## 2019-04-26 DIAGNOSIS — Z9189 Other specified personal risk factors, not elsewhere classified: Secondary | ICD-10-CM | POA: Diagnosis not present

## 2019-04-26 DIAGNOSIS — Z90722 Acquired absence of ovaries, bilateral: Secondary | ICD-10-CM | POA: Diagnosis not present

## 2019-04-26 DIAGNOSIS — A6004 Herpesviral vulvovaginitis: Secondary | ICD-10-CM | POA: Diagnosis not present

## 2019-04-26 DIAGNOSIS — Z6828 Body mass index (BMI) 28.0-28.9, adult: Secondary | ICD-10-CM | POA: Diagnosis not present

## 2019-04-26 DIAGNOSIS — C50111 Malignant neoplasm of central portion of right female breast: Secondary | ICD-10-CM | POA: Diagnosis not present

## 2019-05-01 ENCOUNTER — Other Ambulatory Visit: Payer: Self-pay

## 2019-05-01 ENCOUNTER — Encounter: Payer: Self-pay | Admitting: Plastic Surgery

## 2019-05-01 ENCOUNTER — Ambulatory Visit (INDEPENDENT_AMBULATORY_CARE_PROVIDER_SITE_OTHER): Payer: Federal, State, Local not specified - PPO | Admitting: Plastic Surgery

## 2019-05-01 VITALS — BP 121/81 | HR 74 | Temp 97.3°F | Ht 62.0 in | Wt 156.6 lb

## 2019-05-01 DIAGNOSIS — Z9010 Acquired absence of unspecified breast and nipple: Secondary | ICD-10-CM

## 2019-05-01 DIAGNOSIS — N651 Disproportion of reconstructed breast: Secondary | ICD-10-CM | POA: Diagnosis not present

## 2019-05-01 NOTE — Progress Notes (Signed)
Patient ID: Leslie Salazar, female    DOB: 06/21/1965, 54 y.o.   MRN: [id number]   Chief Complaint  Patient presents with  . Breast Problem    The patient is a 54 year old female here for follow-up after bilateral breast reconstruction.  She originally had right breast cancer and had a lumpectomy.  The margins were positive so she had a second lumpectomy.  Those margins were still positive so she opted for bilateral mastectomies.  She had radiation on the right side.  I did a latissimus reconstruction to the right side followed by placement of a smooth round high-profile 683 cc silicone implant placed in 03/2013.  She has a little bit of capsular contracture with the implant positioning laterally on the right side.  She has gained a little bit of weight so this has changed the symmetry so now she has a little bit of asymmetry with the right side looking larger than the left.  There is a little bit of a dog-ear to the posterior latissimus incision.  She also has loss of upper pole fullness on the right side.  According to the notes I did not do surgery on the left breast.  I did do the nipple areolar tattoos.  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  HENT: Negative.   Eyes: Negative.   Respiratory: Negative for chest tightness and shortness of breath.   Cardiovascular: Negative for leg swelling.  Gastrointestinal: Negative for abdominal distention and abdominal pain.  Endocrine: Negative.   Genitourinary: Negative.   Musculoskeletal: Negative.   Neurological: Negative.   Hematological: Negative.   Psychiatric/Behavioral: Negative.     Past Medical History:  Diagnosis Date  . Acne   . Cancer ([place]) 2012   RIGHT DUCTAL CARCINOMA INSITU..  . Complication of anesthesia   . Condyloma    ON BUTTOCKS  . 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  Fibroids   . Heart palpitations   . High cholesterol   . History of angina   . HSV-1 (herpes simplex virus 1) infection   . Hx of radiation therapy 2013   from april to may - six weeks  . Hypertension   . Left ovarian cyst   . PONV (postoperative nausea and vomiting)    nausea no vomiting  . Scoliosis   . Urinary urgency   . Wears glasses     Past Surgical History:  Procedure Laterality Date  . BREAST RECONSTRUCTION WITH PLACEMENT OF TISSUE EXPANDER AND FLEX HD (ACELLULAR HYDRATED DERMIS) Right 11/16/2012   Procedure: RIGHT LATISSIMUS myocutaneous FLAP WITH PLACEMENT OF TISSUE EXPANDER  TO RIGHT BREAST;  Surgeon: Theodoro Kos, DO;  Location: [facility];  Service: Plastics;  Laterality: Right;  . CARDIAC CATHETERIZATION N/A 02/28/2015   Procedure: Left Heart Cath and Coronary Angiography;  Surgeon: Adrian Prows, MD;  Location: [facility];  Service: Cardiovascular;  Laterality: N/A;  . CESAREAN SECTION  2000  . ENDOMETRIAL ABLATION W/ NOVASURE  2007  . INSERTION OF TISSUE EXPANDER AFTER MASTECTOMY Bilateral 04/20/2011   Breast Ca (r) T1b N0 M0 stage IA infiltrating ductal carcinoma  . LAPAROSCOPIC BILATERAL SALPINGO OOPHERECTOMY Bilateral 08/30/2018   Procedure: LAPAROSCOPIC BILATERAL SALPINGO OOPHORECTOMY w/ PERITONEAL WASHINGS;  Surgeon: Princess Bruins, MD;  Location: [facility];  Service: Gynecology;  Laterality: Bilateral;  . LASER ABLATION OF THE CERVIX  1994   FOR DYSPLASIA  . LIPOSUCTION WITH LIPOFILLING Right 03/14/2013   Procedure: LIPOSUCTION WITH LIPOFILLING;  Surgeon: Theodoro Kos, DO;  Location: [facility];  Service: Plastics;  Laterality: Right;  . RECONSTRUCTION BREAST W/ LATISSIMUS DORSI FLAP Right 11/16/2012  . REMOVAL OF TISSUE EXPANDER AND PLACEMENT OF IMPLANT Right 03/14/2013   Procedure: REMOVAL OF RIGHT BREAST TISSUE EXPANDER WITH PLACEMENT OF RIGHT BREAST IMPLANT;  Surgeon: Theodoro Kos, DO;  Location: [facility];   Service: Plastics;  Laterality: Right;  Removal of Right Breast Tissue Expander with Placement of Right Breast Implant, Possible Lipsuction with Lipofilling  . TISSUE EXPANDER PLACEMENT Right 11/16/2012  . TISSUE EXPANDER REMOVAL Bilateral 12/2011  . TUBAL LIGATION  09/08/2001   BY LAPAROSCOPY ,, HULKA CLIPS TECHNIQUE  . WISDOM TOOTH EXTRACTION        Current Outpatient Medications:  .  aspirin EC 81 MG tablet, Take 81 mg by mouth every morning., Disp: , Rfl:  .  atorvastatin (LIPITOR) 20 MG tablet, Take 1 tablet (20 mg total) by mouth daily., Disp: 90 tablet, Rfl: 31 .  Calcium Citrate-Vitamin D (CITRACAL + D PO), Take 1 tablet by mouth 2 (two) times daily. 954m Calcium-1000iu Vitamin D, Disp: , Rfl:  .  Cholecalciferol (VITAMIN D-1000 MAX ST) 1000 units tablet, Take 1 tablet by mouth daily. 2400 mg daily, Disp: , Rfl:  .  Coenzyme Q10 100 MG capsule, Take 1 capsule by mouth daily., Disp: , Rfl:  .  Estradiol 10 MCG TABS vaginal tablet, Place 1 tablet (10 mcg total) vaginally 2 (two) times a week., Disp: 24 tablet, Rfl: 4 .  hydrochlorothiazide (MICROZIDE) 12.5 MG capsule, TAKE 1 CAPSULE BY MOUTH EVERY DAY, Disp: 90 capsule, Rfl: 1 .  hydrocortisone (ANUSOL-HC) 2.5 % rectal cream, Place 1 application rectally 2 (two) times daily., Disp: 30 g, Rfl: 3 .  loratadine (CLARITIN) 10 MG tablet, Take 10 mg by mouth every morning. , Disp: , Rfl:  .  metoprolol succinate (TOPROL-XL) 25 MG 24 hr tablet, Take 1 tablet (25 mg total) by mouth daily., Disp: 90 tablet, Rfl: 1 .  Multiple Vitamins-Minerals (MULTIVITAMIN WOMENS 50+ ADV PO), Take by mouth., Disp: , Rfl:  .  Omega-3 Fatty Acids (FISH OIL PO), Take by mouth., Disp: , Rfl:  .  Specialty Vitamins Products (JOINT/BONE VITALITY PO), , Disp: , Rfl:  .  valACYclovir (VALTREX) 500 MG tablet, Take 1 tablet (500 mg total) by mouth daily. (Patient taking differently: Take 500 mg by mouth every morning. ), Disp: 90 tablet, Rfl: 4 .  POTASSIUM GLUCONATE PO,  Take 550 mg by mouth every morning. 2 times per week, Disp: , Rfl:    Objective:   Vitals:   05/01/19 0901  BP: 121/81  Pulse: 74  Temp: (!) 97.3 F (36.3 C)  SpO2: 99%    Physical Exam Vitals and nursing note reviewed.  Constitutional:      Appearance: Normal appearance.  HENT:     Head: Normocephalic and atraumatic.  Eyes:     Extraocular Movements: Extraocular movements intact.  Cardiovascular:     Rate and Rhythm: Normal rate.     Pulses: Normal pulses.  Pulmonary:     Effort: Pulmonary effort is normal. No respiratory distress.  Abdominal:     General: Abdomen is flat. There is no distension.     Tenderness: There is no abdominal tenderness.  Neurological:  General: No focal deficit present.     Mental Status: She is alert and oriented to person, place, and time.  Psychiatric:        Mood and Affect: Mood normal.        Behavior: Behavior normal.     Assessment & Plan:  Acquired absence of breast and absent nipple, unspecified laterality  Breast asymmetry following reconstructive surgery  The patient would do well for improving her symmetry.  I recommend excision of the posterior dog ear.  Fat grafting to the upper pole of the right breast.  Capsular contracture release of the right breast with repositioning of the implant more medially.  Possible small increase in size of the right implant. I do need to find out the size of the left implant since I did not place it.  We will also try to maintain the right nipple areola scar more medially.  After surgery she would be a good candidate for redoing of the nipple areola tattoo.  Recommend, and patient requests, ultrasound due to the changes.  I think this is reasonable course of action with ultrasound of bilateral breasts.   Pictures were obtained of the patient and placed in the chart with the patient's or guardian's permission.   Wallace Going, DO   The 21st Century Cures Act was signed into law in  2016 which includes the topic of electronic health records.  This provides immediate access to information in MyChart.  This includes consultation notes, operative notes, office notes, lab results and pathology reports.  If you have any questions about what you read please let us know at your next visit or call us at the office.  We are right here with you.

## 2019-05-10 ENCOUNTER — Other Ambulatory Visit: Payer: Self-pay | Admitting: Plastic Surgery

## 2019-05-10 DIAGNOSIS — N651 Disproportion of reconstructed breast: Secondary | ICD-10-CM

## 2019-05-10 DIAGNOSIS — Z9010 Acquired absence of unspecified breast and nipple: Secondary | ICD-10-CM

## 2019-05-14 ENCOUNTER — Other Ambulatory Visit: Payer: Self-pay | Admitting: Plastic Surgery

## 2019-05-14 ENCOUNTER — Other Ambulatory Visit: Payer: Self-pay

## 2019-05-14 ENCOUNTER — Ambulatory Visit
Admission: RE | Admit: 2019-05-14 | Discharge: 2019-05-14 | Disposition: A | Discharge status: Home / Self Care | Payer: Federal, State, Local not specified - PPO | Source: Ambulatory Visit | Attending: Plastic Surgery | Admitting: Plastic Surgery

## 2019-05-14 DIAGNOSIS — Z9010 Acquired absence of unspecified breast and nipple: Secondary | ICD-10-CM

## 2019-05-14 DIAGNOSIS — N651 Disproportion of reconstructed breast: Secondary | ICD-10-CM

## 2019-05-14 DIAGNOSIS — N6489 Other specified disorders of breast: Secondary | ICD-10-CM | POA: Diagnosis not present

## 2019-05-17 DIAGNOSIS — Z20822 Contact with and (suspected) exposure to covid-19: Secondary | ICD-10-CM | POA: Diagnosis not present

## 2019-05-28 ENCOUNTER — Telehealth: Payer: Self-pay | Admitting: *Deleted

## 2019-05-28 ENCOUNTER — Encounter: Payer: Federal, State, Local not specified - PPO | Admitting: Obstetrics & Gynecology

## 2019-05-28 MED ORDER — VALACYCLOVIR HCL 500 MG PO TABS: 500.0000 mg | ORAL_TABLET | Freq: Every day | ORAL | 0 refills | Status: DC

## 2019-05-28 NOTE — Telephone Encounter (Signed)
Patient called requesting refill on Valtrex 500 mg tablet 1 po daily. Annual exam scheduled on 06/20/19. Rx sent.

## 2019-05-28 NOTE — H&P (View-Only) (Signed)
ICD-10-CM   1. Breast asymmetry following reconstructive surgery  N65.1       Patient ID: Leslie Salazar, female    DOB: [birth year], 54 y.o.   MRN: [id number]   History of Present Illness: Leslie Salazar is a 54 y.o.  female  with a history of asymmetry after breast reconstruction.  She presents for preoperative evaluation for upcoming procedure, fat grafting to right breast, right capsular contracture release, and excision of back excess skin, scheduled for 06/07/2019 with Dr. Marla Roe.  Summary from previous visit: Patient had right breast cancer, two right side lumpectomies without clear margins, and then bilateral mastectomies followed by radiation on the right side and left reconstruction with implant placement. Dr. Marla Roe did the right side reconstruction with a latissimus flap and placement of a smooth round high profile 366 cc silicone implant in 4403 and the nipple areolar tattoos. Since then she has had some capsular contracture of the right breast causing the implant to position laterally. She has gained some weight resulting in some asymmetry with the right side looking larger than the left. In addition there is a dog-ear to the posterior latissimus incision and she has lost some upper pole fullness on the right.   Ultrasound Results (05/14/19): No extracapsular silicone seen on either side. 1.4 cm normal fat lobule or lipoma at 2 o'clock position of left breast.  She works as a Freight forwarder at [employer]. PMH significant for HTN, HLD, daily aspirin. Sees Dr. Woody Seller for cardiology for yearly checks since mother died from heart disease. Denies personal or family history of blood clots.   The patient reports no problems with anesthesia.   Past Medical History: Allergies: No Known Allergies  Current Medications:  Current Outpatient Medications:  .  aspirin EC 81 MG tablet, Take 81 mg by mouth every morning., Disp: , Rfl:  .  atorvastatin (LIPITOR) 20 MG tablet, Take 1 tablet (20  mg total) by mouth daily., Disp: 90 tablet, Rfl: 31 .  Calcium Citrate-Vitamin D (CITRACAL + D PO), Take 1 tablet by mouth 2 (two) times daily. 931m Calcium-1000iu Vitamin D, Disp: , Rfl:  .  Cholecalciferol (VITAMIN D-1000 MAX ST) 1000 units tablet, Take 1 tablet by mouth daily. 2400 mg daily, Disp: , Rfl:  .  Coenzyme Q10 100 MG capsule, Take 1 capsule by mouth daily., Disp: , Rfl:  .  Estradiol 10 MCG TABS vaginal tablet, Place 1 tablet (10 mcg total) vaginally 2 (two) times a week., Disp: 24 tablet, Rfl: 4 .  hydrochlorothiazide (MICROZIDE) 12.5 MG capsule, TAKE 1 CAPSULE BY MOUTH EVERY DAY, Disp: 90 capsule, Rfl: 1 .  hydrocortisone (ANUSOL-HC) 2.5 % rectal cream, Place 1 application rectally 2 (two) times daily., Disp: 30 g, Rfl: 3 .  loratadine (CLARITIN) 10 MG tablet, Take 10 mg by mouth every morning. , Disp: , Rfl:  .  metoprolol succinate (TOPROL-XL) 25 MG 24 hr tablet, Take 1 tablet (25 mg total) by mouth daily., Disp: 90 tablet, Rfl: 1 .  Multiple Vitamins-Minerals (MULTIVITAMIN WOMENS 50+ ADV PO), Take by mouth., Disp: , Rfl:  .  Omega-3 Fatty Acids (FISH OIL PO), Take by mouth., Disp: , Rfl:  .  Specialty Vitamins Products (JOINT/BONE VITALITY PO), , Disp: , Rfl:  .  valACYclovir (VALTREX) 500 MG tablet, Take 1 tablet (500 mg total) by mouth daily., Disp: 90 tablet, Rfl: 0 .  POTASSIUM GLUCONATE PO, Take 550 mg by mouth every morning. 2 times per week, Disp: ,  Rfl:   Past Medical Problems: Past Medical History:  Diagnosis Date  . Acne   . Cancer ([place]) 2012   RIGHT DUCTAL CARCINOMA INSITU..  . Complication of anesthesia   . Condyloma    ON BUTTOCKS  . 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 Fibroids   . Heart palpitations   . High cholesterol   . History of angina   . HSV-1 (herpes simplex virus 1) infection   . Hx of radiation therapy 2013   from april to may - six weeks  . Hypertension   . Left ovarian cyst   . PONV (postoperative  nausea and vomiting)    nausea no vomiting  . Scoliosis   . Urinary urgency   . Wears glasses     Past Surgical History: Past Surgical History:  Procedure Laterality Date  . BREAST RECONSTRUCTION WITH PLACEMENT OF TISSUE EXPANDER AND FLEX HD (ACELLULAR HYDRATED DERMIS) Right 11/16/2012   Procedure: RIGHT LATISSIMUS myocutaneous FLAP WITH PLACEMENT OF TISSUE EXPANDER  TO RIGHT BREAST;  Surgeon: Theodoro Kos, DO;  Location: [place];  Service: Plastics;  Laterality: Right;  . CARDIAC CATHETERIZATION N/A 02/28/2015   Procedure: Left Heart Cath and Coronary Angiography;  Surgeon: Adrian Prows, MD;  Location: [facility];  Service: Cardiovascular;  Laterality: N/A;  . CESAREAN SECTION  2000  . ENDOMETRIAL ABLATION W/ NOVASURE  2007  . INSERTION OF TISSUE EXPANDER AFTER MASTECTOMY Bilateral 04/20/2011   Breast Ca (r) T1b N0 M0 stage IA infiltrating ductal carcinoma  . LAPAROSCOPIC BILATERAL SALPINGO OOPHERECTOMY Bilateral 08/30/2018   Procedure: LAPAROSCOPIC BILATERAL SALPINGO OOPHORECTOMY w/ PERITONEAL WASHINGS;  Surgeon: Princess Bruins, MD;  Location: [place];  Service: Gynecology;  Laterality: Bilateral;  . LASER ABLATION OF THE CERVIX  1994   FOR DYSPLASIA  . LIPOSUCTION WITH LIPOFILLING Right 03/14/2013   Procedure: LIPOSUCTION WITH LIPOFILLING;  Surgeon: Theodoro Kos, DO;  Location: [place];  Service: Plastics;  Laterality: Right;  . RECONSTRUCTION BREAST W/ LATISSIMUS DORSI FLAP Right 11/16/2012  . REMOVAL OF TISSUE EXPANDER AND PLACEMENT OF IMPLANT Right 03/14/2013   Procedure: REMOVAL OF RIGHT BREAST TISSUE EXPANDER WITH PLACEMENT OF RIGHT BREAST IMPLANT;  Surgeon: Theodoro Kos, DO;  Location: [place];  Service: Plastics;  Laterality: Right;  Removal of Right Breast Tissue Expander with Placement of Right Breast Implant, Possible Lipsuction with Lipofilling  . TISSUE EXPANDER PLACEMENT Right 11/16/2012  . TISSUE EXPANDER REMOVAL  Bilateral 12/2011  . TUBAL LIGATION  09/08/2001   BY LAPAROSCOPY ,, HULKA CLIPS TECHNIQUE  . WISDOM TOOTH EXTRACTION      Social History: Social History   Socioeconomic History  . Marital status: Married    Spouse name: Not on file  . Number of children: Not on file  . Years of education: Not on file  . Highest education level: Not on file  Occupational History  . Not on file  Tobacco Use  . Smoking status: Never Smoker  . Smokeless tobacco: Never Used  Substance and Sexual Activity  . Alcohol use: Yes    Alcohol/week: 1.0 standard drinks    Types: 1 Glasses of wine per week  . Drug use: No  . Sexual activity: Yes    Birth control/protection: Surgical, Condom    Comment: 1ST INTERCOURSE- 15, PARTNERS- 4  Other Topics Concern  . Not on file  Social History Narrative  . Not on file  Social Determinants of Health   Financial Resource Strain:   . Difficulty of Paying Living Expenses: Not on file  Food Insecurity:   . Worried About Charity fundraiser in the Last Year: Not on file  . Ran Out of Food in the Last Year: Not on file  Transportation Needs:   . Lack of Transportation (Medical): Not on file  . Lack of Transportation (Non-Medical): Not on file  Physical Activity:   . Days of Exercise per Week: Not on file  . Minutes of Exercise per Session: Not on file  Stress:   . Feeling of Stress : Not on file  Social Connections:   . Frequency of Communication with Friends and Family: Not on file  . Frequency of Social Gatherings with Friends and Family: Not on file  . Attends Religious Services: Not on file  . Active Member of Clubs or Organizations: Not on file  . Attends Archivist Meetings: Not on file  . Marital Status: Not on file  Intimate Partner Violence:   . Fear of Current or Ex-Partner: Not on file  . Emotionally Abused: Not on file  . Physically Abused: Not on file  . Sexually Abused: Not on file    Family History: Family History  Problem  Relation Age of Onset  . Colon cancer Paternal Aunt   . Diabetes Mother        HAS HAD A KIDNEY TRANSPLATN  . Hypertension Mother   . Kidney failure Mother   . Hypertension Father   . Gallstones Father   . Gallstones Sister     Review of Systems: Review of Systems  Constitutional: Negative for chills and fever.  HENT: Negative for congestion and sore throat.   Respiratory: Negative for cough and shortness of breath.   Cardiovascular: Negative for chest pain and palpitations.  Gastrointestinal: Negative for abdominal pain, nausea and vomiting.  Musculoskeletal: Negative for back pain, joint pain, myalgias and neck pain.  Skin: Negative for itching and rash.    Physical Exam: Vital Signs BP 127/85 (BP Location: Left Arm, Patient Position: Sitting, Cuff Size: Large)   Pulse 60   Temp (!) 97.5 F (36.4 C) (Temporal)   Ht _0  (1.575 m)   Wt 155 lb (70.3 kg)   SpO2 100%   BMI 28.35 kg/m  Physical Exam Constitutional:      Appearance: Normal appearance. She is normal weight.  HENT:     Head: Normocephalic and atraumatic.  Eyes:     Extraocular Movements: Extraocular movements intact.  Cardiovascular:     Rate and Rhythm: Normal rate and regular rhythm.     Pulses: Normal pulses.     Heart sounds: Normal heart sounds.  Pulmonary:     Effort: Pulmonary effort is normal.     Breath sounds: Normal breath sounds. No wheezing, rhonchi or rales.  Chest:     Comments: Right breast implant positioned laterally, Asymmetry right > left. Excess skin 'dog-ear' on posterior latissimus flap. Abdominal:     General: Bowel sounds are normal.     Palpations: Abdomen is soft.  Musculoskeletal:        General: No swelling. Normal range of motion.     Cervical back: Normal range of motion.  Skin:    General: Skin is warm and dry.     Coloration: Skin is not pale.     Findings: No erythema or rash.  Neurological:     General: No focal deficit present.  Mental Status: She is alert  and oriented to person, place, and time.  Psychiatric:        Mood and Affect: Mood normal.        Behavior: Behavior normal.        Thought Content: Thought content normal.        Judgment: Judgment normal.     Assessment/Plan:  Ms. Rabelo scheduled for fat grafting to right breast, right capsular contracture release, and excision of back excess skin with Dr. Marla Roe.  Possible small increase in size of right implant. After surgery may prefer redoing of the NAC tattoo.  Smoking Status: non-smoker Last Mammogram: N/A - bilateral mastectomies  Caprini Score: 6 High; Risk Factors include: 54 year old female, BMI >25, personal hx of cancer, and length of planned surgery. Recommendation for mechanical and pharmacological prophylaxis during surgery. Encourage early ambulation.   Pictures obtained at 05/01/19 visit.  Post-op Rx sent to pharmacy: Keflex, zofran, Norco  The consent was obtained with risks and complications reviewed which included bleeding, pain, scar, infection and the risk of anesthesia.  The patients questions were answered to the patients expressed satisfaction.  The [place] was signed into law in 2016 which includes the topic of electronic health records.  This provides immediate access to information in MyChart.  This includes consultation notes, operative notes, office notes, lab results and pathology reports.  If you have any questions about what you read please let us know at your next visit or call us at the office.  We are right here with you.   Electronically signed by: Threasa Heads, PA-C 05/29/2019 4:22 PM

## 2019-05-28 NOTE — Progress Notes (Signed)
ICD-10-CM   1. Breast asymmetry following reconstructive surgery  N65.1       Patient ID: Leslie Salazar, female    DOB: [birth year], 54 y.o.   MRN: [id number]   History of Present Illness: Leslie Salazar is a 54 y.o.  female  with a history of asymmetry after breast reconstruction.  She presents for preoperative evaluation for upcoming procedure, fat grafting to right breast, right capsular contracture release, and excision of back excess skin, scheduled for 06/07/2019 with Dr. Marla Roe.  Summary from previous visit: Patient had right breast cancer, two right side lumpectomies without clear margins, and then bilateral mastectomies followed by radiation on the right side and left reconstruction with implant placement. Dr. Marla Roe did the right side reconstruction with a latissimus flap and placement of a smooth round high profile 366 cc silicone implant in 4403 and the nipple areolar tattoos. Since then she has had some capsular contracture of the right breast causing the implant to position laterally. She has gained some weight resulting in some asymmetry with the right side looking larger than the left. In addition there is a dog-ear to the posterior latissimus incision and she has lost some upper pole fullness on the right.   Ultrasound Results (05/14/19): No extracapsular silicone seen on either side. 1.4 cm normal fat lobule or lipoma at 2 o'clock position of left breast.  She works as a Freight forwarder at [employer]. PMH significant for HTN, HLD, daily aspirin. Sees Dr. Woody Seller for cardiology for yearly checks since mother died from heart disease. Denies personal or family history of blood clots.   The patient reports no problems with anesthesia.   Past Medical History: Allergies: No Known Allergies  Current Medications:  Current Outpatient Medications:  .  aspirin EC 81 MG tablet, Take 81 mg by mouth every morning., Disp: , Rfl:  .  atorvastatin (LIPITOR) 20 MG tablet, Take 1 tablet (20  mg total) by mouth daily., Disp: 90 tablet, Rfl: 31 .  Calcium Citrate-Vitamin D (CITRACAL + D PO), Take 1 tablet by mouth 2 (two) times daily. 931m Calcium-1000iu Vitamin D, Disp: , Rfl:  .  Cholecalciferol (VITAMIN D-1000 MAX ST) 1000 units tablet, Take 1 tablet by mouth daily. 2400 mg daily, Disp: , Rfl:  .  Coenzyme Q10 100 MG capsule, Take 1 capsule by mouth daily., Disp: , Rfl:  .  Estradiol 10 MCG TABS vaginal tablet, Place 1 tablet (10 mcg total) vaginally 2 (two) times a week., Disp: 24 tablet, Rfl: 4 .  hydrochlorothiazide (MICROZIDE) 12.5 MG capsule, TAKE 1 CAPSULE BY MOUTH EVERY DAY, Disp: 90 capsule, Rfl: 1 .  hydrocortisone (ANUSOL-HC) 2.5 % rectal cream, Place 1 application rectally 2 (two) times daily., Disp: 30 g, Rfl: 3 .  loratadine (CLARITIN) 10 MG tablet, Take 10 mg by mouth every morning. , Disp: , Rfl:  .  metoprolol succinate (TOPROL-XL) 25 MG 24 hr tablet, Take 1 tablet (25 mg total) by mouth daily., Disp: 90 tablet, Rfl: 1 .  Multiple Vitamins-Minerals (MULTIVITAMIN WOMENS 50+ ADV PO), Take by mouth., Disp: , Rfl:  .  Omega-3 Fatty Acids (FISH OIL PO), Take by mouth., Disp: , Rfl:  .  Specialty Vitamins Products (JOINT/BONE VITALITY PO), , Disp: , Rfl:  .  valACYclovir (VALTREX) 500 MG tablet, Take 1 tablet (500 mg total) by mouth daily., Disp: 90 tablet, Rfl: 0 .  POTASSIUM GLUCONATE PO, Take 550 mg by mouth every morning. 2 times per week, Disp: ,  Rfl:   Past Medical Problems: Past Medical History:  Diagnosis Date  . Acne   . Cancer ([facility]) 2012   RIGHT DUCTAL CARCINOMA INSITU..  . Complication of anesthesia   . Condyloma    ON BUTTOCKS  . 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 Fibroids   . Heart palpitations   . High cholesterol   . History of angina   . HSV-1 (herpes simplex virus 1) infection   . Hx of radiation therapy 2013   from april to may - six weeks  . Hypertension   . Left ovarian cyst   . PONV (postoperative  nausea and vomiting)    nausea no vomiting  . Scoliosis   . Urinary urgency   . Wears glasses     Past Surgical History: Past Surgical History:  Procedure Laterality Date  . BREAST RECONSTRUCTION WITH PLACEMENT OF TISSUE EXPANDER AND FLEX HD (ACELLULAR HYDRATED DERMIS) Right 11/16/2012   Procedure: RIGHT LATISSIMUS myocutaneous FLAP WITH PLACEMENT OF TISSUE EXPANDER  TO RIGHT BREAST;  Surgeon: Claire Sanger, DO;  Location: [facility];  Service: Plastics;  Laterality: Right;  . CARDIAC CATHETERIZATION N/A 02/28/2015   Procedure: Left Heart Cath and Coronary Angiography;  Surgeon: Jay Ganji, MD;  Location: [facility];  Service: Cardiovascular;  Laterality: N/A;  . CESAREAN SECTION  2000  . ENDOMETRIAL ABLATION W/ NOVASURE  2007  . INSERTION OF TISSUE EXPANDER AFTER MASTECTOMY Bilateral 04/20/2011   Breast Ca (r) T1b N0 M0 stage IA infiltrating ductal carcinoma  . LAPAROSCOPIC BILATERAL SALPINGO OOPHERECTOMY Bilateral 08/30/2018   Procedure: LAPAROSCOPIC BILATERAL SALPINGO OOPHORECTOMY w/ PERITONEAL WASHINGS;  Surgeon: Lavoie, Marie-Lyne, MD;  Location: [facility];  Service: Gynecology;  Laterality: Bilateral;  . LASER ABLATION OF THE CERVIX  1994   FOR DYSPLASIA  . LIPOSUCTION WITH LIPOFILLING Right 03/14/2013   Procedure: LIPOSUCTION WITH LIPOFILLING;  Surgeon: Claire Sanger, DO;  Location: [facility];  Service: Plastics;  Laterality: Right;  . RECONSTRUCTION BREAST W/ LATISSIMUS DORSI FLAP Right 11/16/2012  . REMOVAL OF TISSUE EXPANDER AND PLACEMENT OF IMPLANT Right 03/14/2013   Procedure: REMOVAL OF RIGHT BREAST TISSUE EXPANDER WITH PLACEMENT OF RIGHT BREAST IMPLANT;  Surgeon: Claire Sanger, DO;  Location: [facility];  Service: Plastics;  Laterality: Right;  Removal of Right Breast Tissue Expander with Placement of Right Breast Implant, Possible Lipsuction with Lipofilling  . TISSUE EXPANDER PLACEMENT Right 11/16/2012  . TISSUE EXPANDER REMOVAL  Bilateral 12/2011  . TUBAL LIGATION  09/08/2001   BY LAPAROSCOPY ,, HULKA CLIPS TECHNIQUE  . WISDOM TOOTH EXTRACTION      Social History: Social History   Socioeconomic History  . Marital status: Married    Spouse name: Not on file  . Number of children: Not on file  . Years of education: Not on file  . Highest education level: Not on file  Occupational History  . Not on file  Tobacco Use  . Smoking status: Never Smoker  . Smokeless tobacco: Never Used  Substance and Sexual Activity  . Alcohol use: Yes    Alcohol/week: 1.0 standard drinks    Types: 1 Glasses of wine per week  . Drug use: No  . Sexual activity: Yes    Birth control/protection: Surgical, Condom    Comment: 1ST INTERCOURSE- 17, PARTNERS- 4  Other Topics Concern  . Not on file  Social History Narrative  . Not on file     Social Determinants of Health   Financial Resource Strain:   . Difficulty of Paying Living Expenses: Not on file  Food Insecurity:   . Worried About Charity fundraiser in the Last Year: Not on file  . Ran Out of Food in the Last Year: Not on file  Transportation Needs:   . Lack of Transportation (Medical): Not on file  . Lack of Transportation (Non-Medical): Not on file  Physical Activity:   . Days of Exercise per Week: Not on file  . Minutes of Exercise per Session: Not on file  Stress:   . Feeling of Stress : Not on file  Social Connections:   . Frequency of Communication with Friends and Family: Not on file  . Frequency of Social Gatherings with Friends and Family: Not on file  . Attends Religious Services: Not on file  . Active Member of Clubs or Organizations: Not on file  . Attends Archivist Meetings: Not on file  . Marital Status: Not on file  Intimate Partner Violence:   . Fear of Current or Ex-Partner: Not on file  . Emotionally Abused: Not on file  . Physically Abused: Not on file  . Sexually Abused: Not on file    Family History: Family History  Problem  Relation Age of Onset  . Colon cancer Paternal Aunt   . Diabetes Mother        HAS HAD A KIDNEY TRANSPLATN  . Hypertension Mother   . Kidney failure Mother   . Hypertension Father   . Gallstones Father   . Gallstones Sister     Review of Systems: Review of Systems  Constitutional: Negative for chills and fever.  HENT: Negative for congestion and sore throat.   Respiratory: Negative for cough and shortness of breath.   Cardiovascular: Negative for chest pain and palpitations.  Gastrointestinal: Negative for abdominal pain, nausea and vomiting.  Musculoskeletal: Negative for back pain, joint pain, myalgias and neck pain.  Skin: Negative for itching and rash.    Physical Exam: Vital Signs BP 127/85 (BP Location: Left Arm, Patient Position: Sitting, Cuff Size: Large)   Pulse 60   Temp (!) 97.5 F (36.4 C) (Temporal)   Ht _0  (1.575 m)   Wt 155 lb (70.3 kg)   SpO2 100%   BMI 28.35 kg/m  Physical Exam Constitutional:      Appearance: Normal appearance. She is normal weight.  HENT:     Head: Normocephalic and atraumatic.  Eyes:     Extraocular Movements: Extraocular movements intact.  Cardiovascular:     Rate and Rhythm: Normal rate and regular rhythm.     Pulses: Normal pulses.     Heart sounds: Normal heart sounds.  Pulmonary:     Effort: Pulmonary effort is normal.     Breath sounds: Normal breath sounds. No wheezing, rhonchi or rales.  Chest:     Comments: Right breast implant positioned laterally, Asymmetry right > left. Excess skin 'dog-ear' on posterior latissimus flap. Abdominal:     General: Bowel sounds are normal.     Palpations: Abdomen is soft.  Musculoskeletal:        General: No swelling. Normal range of motion.     Cervical back: Normal range of motion.  Skin:    General: Skin is warm and dry.     Coloration: Skin is not pale.     Findings: No erythema or rash.  Neurological:     General: No focal deficit present.  Mental Status: She is alert  and oriented to person, place, and time.  Psychiatric:        Mood and Affect: Mood normal.        Behavior: Behavior normal.        Thought Content: Thought content normal.        Judgment: Judgment normal.     Assessment/Plan:  Ms. Rabelo scheduled for fat grafting to right breast, right capsular contracture release, and excision of back excess skin with Dr. Marla Roe.  Possible small increase in size of right implant. After surgery may prefer redoing of the NAC tattoo.  Smoking Status: non-smoker Last Mammogram: N/A - bilateral mastectomies  Caprini Score: 6 High; Risk Factors include: 54 year old female, BMI >25, personal hx of cancer, and length of planned surgery. Recommendation for mechanical and pharmacological prophylaxis during surgery. Encourage early ambulation.   Pictures obtained at 05/01/19 visit.  Post-op Rx sent to pharmacy: Keflex, zofran, Norco  The consent was obtained with risks and complications reviewed which included bleeding, pain, scar, infection and the risk of anesthesia.  The patients questions were answered to the patients expressed satisfaction.  The [place] was signed into law in [place] which includes the topic of electronic health records.  This provides immediate access to information in MyChart.  This includes consultation notes, operative notes, office notes, lab results and pathology reports.  If you have any questions about what you read please let us know at your next visit or call us at the office.  We are right here with you.   Electronically signed by: Threasa Heads, PA-C 05/29/2019 4:22 PM

## 2019-05-29 ENCOUNTER — Other Ambulatory Visit: Payer: Self-pay

## 2019-05-29 ENCOUNTER — Ambulatory Visit (INDEPENDENT_AMBULATORY_CARE_PROVIDER_SITE_OTHER): Payer: Federal, State, Local not specified - PPO | Admitting: Plastic Surgery

## 2019-05-29 ENCOUNTER — Encounter: Payer: Self-pay | Admitting: Plastic Surgery

## 2019-05-29 VITALS — BP 127/85 | HR 60 | Temp 97.5°F | Ht 62.0 in | Wt 155.0 lb

## 2019-05-29 DIAGNOSIS — N651 Disproportion of reconstructed breast: Secondary | ICD-10-CM

## 2019-05-29 MED ORDER — CEPHALEXIN 500 MG PO CAPS: 500.0000 mg | ORAL_CAPSULE | Freq: Four times a day (QID) | ORAL | 0 refills | Status: AC

## 2019-05-29 MED ORDER — ONDANSETRON HCL 4 MG PO TABS: 4.0000 mg | ORAL_TABLET | Freq: Three times a day (TID) | ORAL | 0 refills | Status: DC | PRN

## 2019-05-29 MED ORDER — HYDROCODONE-ACETAMINOPHEN 5-325 MG PO TABS: 1.0000 | ORAL_TABLET | Freq: Three times a day (TID) | ORAL | 0 refills | Status: AC | PRN

## 2019-05-30 ENCOUNTER — Telehealth: Payer: Self-pay

## 2019-05-30 NOTE — Telephone Encounter (Signed)
Surgical/cardiac clearance faxed to Dr. Woody Seller per Venetia Night, PA-for surgery scheduled 06/07/19 Order will be scanned into pt's chart

## 2019-05-31 ENCOUNTER — Encounter (HOSPITAL_BASED_OUTPATIENT_CLINIC_OR_DEPARTMENT_OTHER): Payer: Self-pay | Admitting: Plastic Surgery

## 2019-05-31 ENCOUNTER — Other Ambulatory Visit: Payer: Self-pay

## 2019-05-31 NOTE — Progress Notes (Signed)
Spoke w/ via phone for pre-op interview---[facility] needs dos----  I stat 8, ekg            Lab results------ COVID test ------06-04-2019 Arrive at -------1015 06-07-2019 NPO after ------midnight Medications to take morning of surgery -----loratadine, atorvastatin, metorpolol succinate Diabetic medication -----n/a Patient Special Instructions ----- Pre-Op special Istructions ----- Patient verbalized understanding of instructions that were given at this phone interview. Patient denies shortness of breath, chest pain, fever, cough a this phone interview.  Anesthesia :history of abnormal ekg  PCP:dr robyn sanders Cardiologist : dr vyas Chest x-ray :none EKG :none Echo : Cardiac Cath : 02-28-2015 epic Sleep Study/ CPAP :none Fasting Blood Sugar :      / Checks Blood Sugar -- times a day:  n/a Blood Thinner/ Instructions /Last Dose:none ASA / Instructions/ Last Dose : 81 mg aspirin patient stopping 2 days before surgery per patient wishes  Patient denies shortness of breath, chest pain, fever, and cough at this phone interview.

## 2019-06-04 ENCOUNTER — Other Ambulatory Visit (HOSPITAL_COMMUNITY): Payer: Federal, State, Local not specified - PPO

## 2019-06-06 ENCOUNTER — Telehealth: Payer: Self-pay | Admitting: Plastic Surgery

## 2019-06-06 ENCOUNTER — Encounter: Payer: Federal, State, Local not specified - PPO | Admitting: Obstetrics & Gynecology

## 2019-06-06 NOTE — Telephone Encounter (Signed)
Pt called to see if it was okay to get the covid vaccine since she has surgery coming up in March. Spoke with [facility] who will check with Dr. Marla Roe regarding this. Advd pt that we will follow up with her today and to keep her appt for now. Will call pt back once we have confirmed with Dr. Marla Roe.

## 2019-06-15 ENCOUNTER — Encounter: Payer: Federal, State, Local not specified - PPO | Admitting: Plastic Surgery

## 2019-06-19 ENCOUNTER — Telehealth: Payer: Self-pay

## 2019-06-19 ENCOUNTER — Encounter (HOSPITAL_BASED_OUTPATIENT_CLINIC_OR_DEPARTMENT_OTHER): Payer: Self-pay | Admitting: Plastic Surgery

## 2019-06-19 ENCOUNTER — Other Ambulatory Visit: Payer: Self-pay

## 2019-06-19 NOTE — Telephone Encounter (Signed)
Surgical/cardiac clearance received from Dr. Woody Seller Forwarded the clearance to Roselyn Reef at [facility] via fax  Copy of clearance scanned to [facility] & to pt's chart I informed Dr. Baltazar Apo & Venetia Night of the results

## 2019-06-20 ENCOUNTER — Encounter: Payer: Federal, State, Local not specified - PPO | Admitting: Obstetrics & Gynecology

## 2019-06-22 ENCOUNTER — Other Ambulatory Visit: Payer: Self-pay

## 2019-06-22 ENCOUNTER — Encounter (HOSPITAL_BASED_OUTPATIENT_CLINIC_OR_DEPARTMENT_OTHER)
Admission: RE | Admit: 2019-06-22 | Discharge: 2019-06-22 | Disposition: A | Discharge status: Home / Self Care | Payer: Federal, State, Local not specified - PPO | Source: Ambulatory Visit | Attending: Plastic Surgery | Admitting: Plastic Surgery

## 2019-06-22 DIAGNOSIS — Z01812 Encounter for preprocedural laboratory examination: Secondary | ICD-10-CM | POA: Diagnosis not present

## 2019-06-22 LAB — BASIC METABOLIC PANEL
Anion gap: 10 (ref 5–15)
BUN: 13 mg/dL (ref 6–20)
CO2: 28 mmol/L (ref 22–32)
Calcium: 10.2 mg/dL (ref 8.9–10.3)
Chloride: 105 mmol/L (ref 98–111)
Creatinine, Ser: 0.8 mg/dL (ref 0.44–1.00)
GFR calc Af Amer: >60 mL/min (ref >60)
GFR calc non Af Amer: >60 mL/min (ref >60)
Glucose, Bld: 96 mg/dL (ref 70–99)
Potassium: 4.1 mmol/L (ref 3.5–5.1)
Sodium: 143 mmol/L (ref 135–145)

## 2019-06-23 ENCOUNTER — Other Ambulatory Visit (HOSPITAL_COMMUNITY)
Admission: RE | Admit: 2019-06-23 | Discharge: 2019-06-23 | Disposition: A | Discharge status: Home / Self Care | Payer: Federal, State, Local not specified - PPO | Source: Ambulatory Visit | Attending: Plastic Surgery | Admitting: Plastic Surgery

## 2019-06-23 DIAGNOSIS — Z01812 Encounter for preprocedural laboratory examination: Secondary | ICD-10-CM | POA: Insufficient documentation

## 2019-06-23 DIAGNOSIS — Z20822 Contact with and (suspected) exposure to covid-19: Secondary | ICD-10-CM | POA: Diagnosis not present

## 2019-06-23 LAB — SARS CORONAVIRUS 2 (TAT 6-24 HRS): SARS Coronavirus 2: NEGATIVE

## 2019-06-27 ENCOUNTER — Ambulatory Visit (HOSPITAL_BASED_OUTPATIENT_CLINIC_OR_DEPARTMENT_OTHER): Payer: Federal, State, Local not specified - PPO | Admitting: Anesthesiology

## 2019-06-27 ENCOUNTER — Encounter (HOSPITAL_BASED_OUTPATIENT_CLINIC_OR_DEPARTMENT_OTHER)
Admission: RE | Disposition: A | Discharge status: Home / Self Care | Payer: Self-pay | Source: Home / Self Care | Attending: Plastic Surgery

## 2019-06-27 ENCOUNTER — Ambulatory Visit (HOSPITAL_BASED_OUTPATIENT_CLINIC_OR_DEPARTMENT_OTHER)
Admission: RE | Admit: 2019-06-27 | Discharge: 2019-06-27 | Disposition: A | Discharge status: Home / Self Care | Payer: Federal, State, Local not specified - PPO | Attending: Plastic Surgery | Admitting: Plastic Surgery

## 2019-06-27 ENCOUNTER — Encounter (HOSPITAL_BASED_OUTPATIENT_CLINIC_OR_DEPARTMENT_OTHER): Payer: Self-pay | Admitting: Plastic Surgery

## 2019-06-27 ENCOUNTER — Other Ambulatory Visit: Payer: Self-pay

## 2019-06-27 DIAGNOSIS — N651 Disproportion of reconstructed breast: Secondary | ICD-10-CM | POA: Diagnosis not present

## 2019-06-27 DIAGNOSIS — Z923 Personal history of irradiation: Secondary | ICD-10-CM | POA: Insufficient documentation

## 2019-06-27 DIAGNOSIS — E785 Hyperlipidemia, unspecified: Secondary | ICD-10-CM | POA: Insufficient documentation

## 2019-06-27 DIAGNOSIS — Z9013 Acquired absence of bilateral breasts and nipples: Secondary | ICD-10-CM | POA: Insufficient documentation

## 2019-06-27 DIAGNOSIS — N6489 Other specified disorders of breast: Secondary | ICD-10-CM | POA: Insufficient documentation

## 2019-06-27 DIAGNOSIS — Z79899 Other long term (current) drug therapy: Secondary | ICD-10-CM | POA: Diagnosis not present

## 2019-06-27 DIAGNOSIS — Z9011 Acquired absence of right breast and nipple: Secondary | ICD-10-CM | POA: Diagnosis not present

## 2019-06-27 DIAGNOSIS — Z853 Personal history of malignant neoplasm of breast: Secondary | ICD-10-CM | POA: Diagnosis not present

## 2019-06-27 DIAGNOSIS — Z7982 Long term (current) use of aspirin: Secondary | ICD-10-CM | POA: Diagnosis not present

## 2019-06-27 DIAGNOSIS — N83202 Unspecified ovarian cyst, left side: Secondary | ICD-10-CM | POA: Diagnosis not present

## 2019-06-27 DIAGNOSIS — Z9010 Acquired absence of unspecified breast and nipple: Secondary | ICD-10-CM | POA: Diagnosis not present

## 2019-06-27 DIAGNOSIS — I10 Essential (primary) hypertension: Secondary | ICD-10-CM | POA: Insufficient documentation

## 2019-06-27 HISTORY — PX: BREAST CAPSULECTOMY WITH IMPLANT EXCHANGE: SHX5592

## 2019-06-27 HISTORY — PX: LIPOSUCTION WITH LIPOFILLING: SHX6436

## 2019-06-27 SURGERY — LIPOSUCTION, WITH FAT TRANSFER
Anesthesia: General | Site: Breast | Laterality: Right

## 2019-06-27 MED ORDER — DEXAMETHASONE SODIUM PHOSPHATE 4 MG/ML IJ SOLN
INTRAMUSCULAR | Status: DC | PRN
  Administered 2019-06-27: 5 mg via INTRAVENOUS

## 2019-06-27 MED ORDER — MIDAZOLAM HCL 2 MG/2ML IJ SOLN
1.0000 mg | INTRAMUSCULAR | Status: DC | PRN
  Administered 2019-06-27: 2 mg via INTRAVENOUS

## 2019-06-27 MED ORDER — SCOPOLAMINE 1 MG/3DAYS TD PT72
MEDICATED_PATCH | TRANSDERMAL | Status: AC
  Filled 2019-06-27: qty 1

## 2019-06-27 MED ORDER — SODIUM CHLORIDE 0.9% FLUSH: 3.0000 mL | INTRAVENOUS | Status: DC | PRN

## 2019-06-27 MED ORDER — OXYCODONE HCL 5 MG PO TABS: 5.0000 mg | ORAL_TABLET | ORAL | Status: DC | PRN

## 2019-06-27 MED ORDER — EPHEDRINE 5 MG/ML INJ
INTRAVENOUS | Status: AC
  Filled 2019-06-27: qty 10

## 2019-06-27 MED ORDER — SODIUM CHLORIDE 0.9% FLUSH: 3.0000 mL | Freq: Two times a day (BID) | INTRAVENOUS | Status: DC

## 2019-06-27 MED ORDER — EPHEDRINE SULFATE 50 MG/ML IJ SOLN
INTRAMUSCULAR | Status: DC | PRN
  Administered 2019-06-27: 10 mg via INTRAVENOUS

## 2019-06-27 MED ORDER — CEFAZOLIN SODIUM-DEXTROSE 2-4 GM/100ML-% IV SOLN
INTRAVENOUS | Status: AC
  Filled 2019-06-27: qty 100

## 2019-06-27 MED ORDER — DEXAMETHASONE SODIUM PHOSPHATE 10 MG/ML IJ SOLN
INTRAMUSCULAR | Status: AC
  Filled 2019-06-27: qty 1

## 2019-06-27 MED ORDER — PROPOFOL 10 MG/ML IV BOLUS
INTRAVENOUS | Status: DC | PRN
  Administered 2019-06-27: 150 mg via INTRAVENOUS

## 2019-06-27 MED ORDER — FENTANYL CITRATE (PF) 100 MCG/2ML IJ SOLN: 25.0000 ug | INTRAMUSCULAR | Status: DC | PRN

## 2019-06-27 MED ORDER — CHLORHEXIDINE GLUCONATE CLOTH 2 % EX PADS: 6.0000 | MEDICATED_PAD | Freq: Once | CUTANEOUS | Status: DC

## 2019-06-27 MED ORDER — ACETAMINOPHEN 325 MG PO TABS: 650.0000 mg | ORAL_TABLET | ORAL | Status: DC | PRN

## 2019-06-27 MED ORDER — MIDAZOLAM HCL 2 MG/2ML IJ SOLN
INTRAMUSCULAR | Status: AC
  Filled 2019-06-27: qty 2

## 2019-06-27 MED ORDER — ONDANSETRON HCL 4 MG/2ML IJ SOLN
4.0000 mg | Freq: Once | INTRAMUSCULAR | Status: AC | PRN
  Administered 2019-06-27: 4 mg via INTRAVENOUS

## 2019-06-27 MED ORDER — FENTANYL CITRATE (PF) 100 MCG/2ML IJ SOLN
50.0000 ug | INTRAMUSCULAR | Status: DC | PRN
  Administered 2019-06-27 (×2): 50 ug via INTRAVENOUS

## 2019-06-27 MED ORDER — ONDANSETRON HCL 4 MG/2ML IJ SOLN
INTRAMUSCULAR | Status: DC | PRN
  Administered 2019-06-27: 4 mg via INTRAVENOUS

## 2019-06-27 MED ORDER — LIDOCAINE 2% (20 MG/ML) 5 ML SYRINGE
INTRAMUSCULAR | Status: DC | PRN
  Administered 2019-06-27: 50 mg via INTRAVENOUS

## 2019-06-27 MED ORDER — DIPHENHYDRAMINE HCL 50 MG/ML IJ SOLN
INTRAMUSCULAR | Status: DC | PRN
  Administered 2019-06-27: 6.25 mg via INTRAVENOUS

## 2019-06-27 MED ORDER — LIDOCAINE HCL 1 % IJ SOLN
INTRAVENOUS | Status: DC | PRN
  Administered 2019-06-27: 800 mL

## 2019-06-27 MED ORDER — OXYCODONE HCL 5 MG/5ML PO SOLN: 5.0000 mg | Freq: Once | ORAL | Status: DC | PRN

## 2019-06-27 MED ORDER — ONDANSETRON HCL 4 MG/2ML IJ SOLN
INTRAMUSCULAR | Status: AC
  Filled 2019-06-27: qty 2

## 2019-06-27 MED ORDER — SUCCINYLCHOLINE CHLORIDE 200 MG/10ML IV SOSY
PREFILLED_SYRINGE | INTRAVENOUS | Status: AC
  Filled 2019-06-27: qty 10

## 2019-06-27 MED ORDER — DIPHENHYDRAMINE HCL 50 MG/ML IJ SOLN
INTRAMUSCULAR | Status: AC
  Filled 2019-06-27: qty 1

## 2019-06-27 MED ORDER — CEFAZOLIN SODIUM-DEXTROSE 2-4 GM/100ML-% IV SOLN
2.0000 g | INTRAVENOUS | Status: AC
  Administered 2019-06-27: 2 g via INTRAVENOUS

## 2019-06-27 MED ORDER — LIDOCAINE-EPINEPHRINE 1 %-1:100000 IJ SOLN
INTRAMUSCULAR | Status: DC | PRN
  Administered 2019-06-27: 10 mL

## 2019-06-27 MED ORDER — PHENYLEPHRINE 40 MCG/ML (10ML) SYRINGE FOR IV PUSH (FOR BLOOD PRESSURE SUPPORT)
PREFILLED_SYRINGE | INTRAVENOUS | Status: AC
  Filled 2019-06-27: qty 10

## 2019-06-27 MED ORDER — LIDOCAINE 2% (20 MG/ML) 5 ML SYRINGE
INTRAMUSCULAR | Status: AC
  Filled 2019-06-27: qty 5

## 2019-06-27 MED ORDER — ACETAMINOPHEN 650 MG RE SUPP: 650.0000 mg | RECTAL | Status: DC | PRN

## 2019-06-27 MED ORDER — FENTANYL CITRATE (PF) 100 MCG/2ML IJ SOLN
INTRAMUSCULAR | Status: AC
  Filled 2019-06-27: qty 2

## 2019-06-27 MED ORDER — LACTATED RINGERS IV SOLN: INTRAVENOUS | Status: DC

## 2019-06-27 MED ORDER — MEPERIDINE HCL 25 MG/ML IJ SOLN: 6.2500 mg | INTRAMUSCULAR | Status: DC | PRN

## 2019-06-27 MED ORDER — ACETAMINOPHEN 160 MG/5ML PO SOLN: 325.0000 mg | ORAL | Status: DC | PRN

## 2019-06-27 MED ORDER — ACETAMINOPHEN 325 MG PO TABS: 325.0000 mg | ORAL_TABLET | ORAL | Status: DC | PRN

## 2019-06-27 MED ORDER — FENTANYL CITRATE (PF) 100 MCG/2ML IJ SOLN
25.0000 ug | INTRAMUSCULAR | Status: DC | PRN
  Administered 2019-06-27: 50 ug via INTRAVENOUS
  Administered 2019-06-27: 25 ug via INTRAVENOUS

## 2019-06-27 MED ORDER — PROPOFOL 500 MG/50ML IV EMUL
INTRAVENOUS | Status: AC
  Filled 2019-06-27: qty 50

## 2019-06-27 MED ORDER — SODIUM CHLORIDE 0.9 % IV SOLN: 250.0000 mL | INTRAVENOUS | Status: DC | PRN

## 2019-06-27 MED ORDER — OXYCODONE HCL 5 MG PO TABS: 5.0000 mg | ORAL_TABLET | Freq: Once | ORAL | Status: DC | PRN

## 2019-06-27 SURGICAL SUPPLY — 76 items
ADH SKN CLS APL DERMABOND .7 (GAUZE/BANDAGES/DRESSINGS) ×1
BAG DECANTER FOR FLEXI CONT (MISCELLANEOUS) ×2 IMPLANT
BINDER ABDOMINAL  9 SM 30-45 (SOFTGOODS)
BINDER ABDOMINAL 10 UNV 27-48 (MISCELLANEOUS) IMPLANT
BINDER ABDOMINAL 12 SM 30-45 (SOFTGOODS) IMPLANT
BINDER ABDOMINAL 9 SM 30-45 (SOFTGOODS) IMPLANT
BINDER BREAST LRG (GAUZE/BANDAGES/DRESSINGS) IMPLANT
BINDER BREAST MEDIUM (GAUZE/BANDAGES/DRESSINGS) IMPLANT
BINDER BREAST XLRG (GAUZE/BANDAGES/DRESSINGS) IMPLANT
BINDER BREAST XXLRG (GAUZE/BANDAGES/DRESSINGS) IMPLANT
BIOPATCH RED 1 DISK 7.0 (GAUZE/BANDAGES/DRESSINGS) IMPLANT
BLADE HEX COATED 2.75 (ELECTRODE) ×2 IMPLANT
BLADE SURG 15 STRL LF DISP TIS (BLADE) ×1 IMPLANT
BLADE SURG 15 STRL SS (BLADE) ×2
BNDG GAUZE ELAST 4 BULKY (GAUZE/BANDAGES/DRESSINGS) ×4 IMPLANT
CANISTER SUCT 1200ML W/VALVE (MISCELLANEOUS) ×2 IMPLANT
COVER BACK TABLE 60X90IN (DRAPES) ×2 IMPLANT
COVER MAYO STAND STRL (DRAPES) ×2 IMPLANT
COVER WAND RF STERILE (DRAPES) IMPLANT
DECANTER SPIKE VIAL GLASS SM (MISCELLANEOUS) IMPLANT
DERMABOND ADVANCED (GAUZE/BANDAGES/DRESSINGS) ×1
DERMABOND ADVANCED .7 DNX12 (GAUZE/BANDAGES/DRESSINGS) ×1 IMPLANT
DRAIN CHANNEL 19F RND (DRAIN) IMPLANT
DRAPE LAPAROSCOPIC ABDOMINAL (DRAPES) ×2 IMPLANT
DRSG PAD ABDOMINAL 8X10 ST (GAUZE/BANDAGES/DRESSINGS) ×4 IMPLANT
ELECT BLADE 4.0 EZ CLEAN MEGAD (MISCELLANEOUS) ×2
ELECT BLADE 6.5 EXT (BLADE) IMPLANT
ELECT REM PT RETURN 9FT ADLT (ELECTROSURGICAL) ×2
ELECTRODE BLDE 4.0 EZ CLN MEGD (MISCELLANEOUS) ×1 IMPLANT
ELECTRODE REM PT RTRN 9FT ADLT (ELECTROSURGICAL) ×1 IMPLANT
EVACUATOR SILICONE 100CC (DRAIN) IMPLANT
EXTRACTOR CANIST REVOLVE STRL (CANNISTER) ×2 IMPLANT
GAUZE SPONGE 4X4 12PLY STRL LF (GAUZE/BANDAGES/DRESSINGS) IMPLANT
GLOVE BIO SURGEON STRL SZ 6.5 (GLOVE) ×4 IMPLANT
GLOVE BIOGEL PI IND STRL 7.0 (GLOVE) IMPLANT
GLOVE BIOGEL PI INDICATOR 7.0 (GLOVE) ×1
GLOVE ECLIPSE 6.5 STRL STRAW (GLOVE) ×1 IMPLANT
GOWN STRL REUS W/ TWL LRG LVL3 (GOWN DISPOSABLE) ×2 IMPLANT
GOWN STRL REUS W/TWL LRG LVL3 (GOWN DISPOSABLE) ×8
IV LACTATED RINGERS 1000ML (IV SOLUTION) ×4 IMPLANT
LINER CANISTER 1000CC FLEX (MISCELLANEOUS) ×4 IMPLANT
NDL HYPO 25X1 1.5 SAFETY (NEEDLE) IMPLANT
NDL SAFETY ECLIPSE 18X1.5 (NEEDLE) ×1 IMPLANT
NEEDLE HYPO 18GX1.5 SHARP (NEEDLE) ×2
NEEDLE HYPO 25X1 1.5 SAFETY (NEEDLE) IMPLANT
PACK BASIN DAY SURGERY FS (CUSTOM PROCEDURE TRAY) ×2 IMPLANT
PAD ALCOHOL SWAB (MISCELLANEOUS) ×2 IMPLANT
PENCIL SMOKE EVACUATOR (MISCELLANEOUS) ×2 IMPLANT
PIN SAFETY STERILE (MISCELLANEOUS) IMPLANT
SHEET MEDIUM DRAPE 40X70 STRL (DRAPES) ×1 IMPLANT
SLEEVE SCD COMPRESS KNEE MED (MISCELLANEOUS) ×2 IMPLANT
SPONGE LAP 18X18 RF (DISPOSABLE) ×4 IMPLANT
STRIP SUTURE WOUND CLOSURE 1/2 (MISCELLANEOUS) IMPLANT
SUT MNCRL AB 4-0 PS2 18 (SUTURE) IMPLANT
SUT MON AB 3-0 SH 27 (SUTURE) ×2
SUT MON AB 3-0 SH27 (SUTURE) ×1 IMPLANT
SUT MON AB 5-0 PS2 18 (SUTURE) ×4 IMPLANT
SUT PDS 3-0 CT2 (SUTURE)
SUT PDS AB 2-0 CT2 27 (SUTURE) IMPLANT
SUT PDS II 3-0 CT2 27 ABS (SUTURE) IMPLANT
SUT VIC AB 3-0 SH 27 (SUTURE)
SUT VIC AB 3-0 SH 27X BRD (SUTURE) IMPLANT
SUT VICRYL 4-0 PS2 18IN ABS (SUTURE) IMPLANT
SYR 10ML LL (SYRINGE) ×10 IMPLANT
SYR 3ML 18GX1 1/2 (SYRINGE) IMPLANT
SYR 50ML LL SCALE MARK (SYRINGE) ×4 IMPLANT
SYR BULB IRRIGATION 50ML (SYRINGE) ×2 IMPLANT
SYR CONTROL 10ML LL (SYRINGE) ×2 IMPLANT
SYR TOOMEY 50ML (SYRINGE) ×4 IMPLANT
TOWEL GREEN STERILE FF (TOWEL DISPOSABLE) ×4 IMPLANT
TRAY DSU PREP LF (CUSTOM PROCEDURE TRAY) ×2 IMPLANT
TUBE CONNECTING 20X1/4 (TUBING) ×2 IMPLANT
TUBING INFILTRATION IT-10001 (TUBING) IMPLANT
TUBING SET GRADUATE ASPIR 12FT (MISCELLANEOUS) ×2 IMPLANT
UNDERPAD 30X36 HEAVY ABSORB (UNDERPADS AND DIAPERS) ×4 IMPLANT
YANKAUER SUCT BULB TIP NO VENT (SUCTIONS) ×2 IMPLANT

## 2019-06-27 NOTE — Anesthesia Procedure Notes (Signed)
Procedure Name: LMA Insertion Performed by: Tyra Gural D, CRNA Pre-anesthesia Checklist: Patient identified, Emergency Drugs available, Suction available and Patient being monitored Patient Re-evaluated:Patient Re-evaluated prior to induction Oxygen Delivery Method: Circle system utilized Preoxygenation: Pre-oxygenation with 100% oxygen Induction Type: IV induction Ventilation: Mask ventilation without difficulty LMA: LMA inserted LMA Size: 4.0 Number of attempts: 1 Airway Equipment and Method: Bite block Placement Confirmation: positive ETCO2 Tube secured with: Tape Dental Injury: Teeth and Oropharynx as per pre-operative assessment        

## 2019-06-27 NOTE — Interval H&P Note (Signed)
History and Physical Interval Note:  06/27/2019 10:40 AM  Leslie Salazar  has presented today for surgery, with the diagnosis of Acquired Absence Of Breast And Absent Nipple, Unspecified Laterality;  Breast asymmetry following reconstructive surgery.  The various methods of treatment have been discussed with the patient and family. After consideration of risks, benefits and other options for treatment, the patient has consented to  Procedure(s) with comments: Fat grafting to right breast with excision of back excess skin (Right) - 2 hours, please release of capsular contracture with repositioning of the right implant with possible exchange of right breast implant (Right) as a surgical intervention.  The patient's history has been reviewed, patient examined, no change in status, stable for surgery.  I have reviewed the patient's chart and labs.  Questions were answered to the patient's satisfaction.     Loel Lofty Cornesha Radziewicz

## 2019-06-27 NOTE — Anesthesia Preprocedure Evaluation (Addendum)
Anesthesia Evaluation  Patient identified by MRN, date of birth, ID band Patient awake    Reviewed: Allergy & Precautions, NPO status , Patient's Chart, lab work & pertinent test results  History of Anesthesia Complications (+) PONV and history of anesthetic complications  Airway Mallampati: I  TM Distance: >3 FB Neck ROM: Full    Dental  (+) Teeth Intact, Dental Advisory Given   Pulmonary neg pulmonary ROS,    breath sounds clear to auscultation       Cardiovascular hypertension, Pt. on medications (-) angina Rhythm:Regular Rate:Normal     Neuro/Psych negative neurological ROS     GI/Hepatic negative GI ROS, Neg liver ROS,   Endo/Other  negative endocrine ROS  Renal/GU negative Renal ROS     Musculoskeletal negative musculoskeletal ROS (+)   Abdominal Normal abdominal exam  (+)   Peds  Hematology negative hematology ROS (+)   Anesthesia Other Findings   Reproductive/Obstetrics                             Lab Results  Component Value Date   WBC 9.1 08/25/2018   HGB 13.2 08/25/2018   HCT 39.0 08/25/2018   MCV 92.0 08/25/2018   PLT 237 08/25/2018   Lab Results  Component Value Date   CREATININE 0.80 06/22/2019   BUN 13 06/22/2019   NA 143 06/22/2019   K 4.1 06/22/2019   CL 105 06/22/2019   CO2 28 06/22/2019   No results found for: INR, PROTIME   Anesthesia Physical  Anesthesia Plan  ASA: II  Anesthesia Plan: General   Post-op Pain Management:    Induction: Intravenous  PONV Risk Score and Plan: 4 or greater and Ondansetron, Dexamethasone, Midazolam and Scopolamine patch - Pre-op  Airway Management Planned: Oral ETT and LMA  Additional Equipment: None  Intra-op Plan:   Post-operative Plan: Extubation in OR  Informed Consent: I have reviewed the patients History and Physical, chart, labs and discussed the procedure including the risks, benefits and  alternatives for the proposed anesthesia with the patient or authorized representative who has indicated his/her understanding and acceptance.     Dental advisory given  Plan Discussed with: CRNA, Anesthesiologist and Surgeon  Anesthesia Plan Comments:        Anesthesia Quick Evaluation

## 2019-06-27 NOTE — Discharge Instructions (Signed)
INSTRUCTIONS FOR AFTER SURGERY   You will likely have some questions about what to expect following your operation.  The following information will help you and your family understand what to expect when you are discharged from the hospital.  Following these guidelines will help ensure a smooth recovery and reduce risks of complications.  Postoperative instructions include information on: diet, wound care, medications and physical activity.  AFTER SURGERY Expect to go home after the procedure.  In some cases, you may need to spend one night in the hospital for observation.  DIET This surgery does not require a specific diet.  However, I have to mention that the healthier you eat the better your body can start healing. It is important to increasing your protein intake.  This means limiting the foods with added sugar.  Focus on fruits and vegetables and some meat.  If you have any liposuction during your procedure be sure to drink water.  If your urine is bright yellow, then it is concentrated, and you need to drink more water.  As a general rule after surgery, you should have 8 ounces of water every hour while awake.  If you find you are persistently nauseated or unable to take in liquids let us know.  NO TOBACCO USE or EXPOSURE.  This will slow your healing process and increase the risk of a wound.  WOUND CARE If you don't have a drain: You can shower the day after surgery.  Use fragrance free soap.  Dial, Dove, Ivory and Cetaphil are usually mild on the skin.  If you have a drain: Clean with baby wipes until the drain is removed.   If you have steri-strips / tape directly attached to your skin leave them in place. It is OK to get these wet.  No baths, pools or hot tubs for two weeks. We close your incision to leave the smallest and best-looking scar. No ointment or creams on your incisions until given the go ahead.  Especially not Neosporin (Too many skin reactions with this one).  A few weeks after  surgery you can use Mederma and start massaging the scar. We ask you to wear your binder or sports bra for the first 6 weeks around the clock, including while sleeping. This provides added comfort and helps reduce the fluid accumulation at the surgery site.  ACTIVITY No heavy lifting until cleared by the doctor.  It is OK to walk and climb stairs. In fact, moving your legs is very important to decrease your risk of a blood clot.  It will also help keep you from getting deconditioned.  Every 1 to 2 hours get up and walk for 5 minutes. This will help with a quicker recovery back to normal.  Let pain be your guide so you don't do too much.  NO, you cannot do the spring cleaning and don't plan on taking care of anyone else.  This is your time for TLC.   WORK Everyone returns to work at different times. As a rough guide, most people take at least 1 - 2 weeks off prior to returning to work. If you need documentation for your job, bring the forms to your postoperative follow up visit.  DRIVING Arrange for someone to bring you home from the hospital.  You may be able to drive a few days after surgery but not while taking any narcotics or valium.  BOWEL MOVEMENTS Constipation can occur after anesthesia and while taking pain medication.  It is important   to stay ahead for your comfort.  We recommend taking Milk of Magnesia (2 tablespoons; twice a day) while taking the pain pills.  SEROMA This is fluid your body tried to put in the surgical site.  This is normal but if it creates excessive pain and swelling let us know.  It usually decreases in a few weeks.  MEDICATIONS and PAIN CONTROL At your preoperative visit for you history and physical you were given the following medications: 1. An antibiotic: Start this medication when you get home and take according to the instructions on the bottle. 2. Zofran 4 mg:  This is to treat nausea and vomiting.  You can take this every 6 hours as needed and only if  needed. (LAST DOSE AT 2:17PM, next dose at 8:17pm if needed) 3. Norco (hydrocodone/acetaminophen) 5/325 mg:  This is only to be used after you have taken the motrin or the tylenol. Every 8 hours as needed. Over the counter Medication to take: 4. Ibuprofen (Motrin) 600 mg:  Take this every 6 hours.  If you have additional pain then take 500 mg of the tylenol.  Only take the Norco after you have tried these two. 5. Miralax or stool softener of choice: Take this according to the bottle if you take the [place] Call your surgeon's office if any of the following occur: . Fever 101 degrees F or greater . Excessive bleeding or fluid from the incision site. . Pain that increases over time without aid from the medications . Redness, warmth, or pus draining from incision sites . Persistent nausea or inability to take in liquids . Severe misshapen area that underwent the operation.   Post Anesthesia Home Care Instructions  Activity: Get plenty of rest for the remainder of the day. A responsible individual must stay with you for 24 hours following the procedure.  For the next 24 hours, DO NOT: -Drive a car -Paediatric nurse -Drink alcoholic beverages -Take any medication unless instructed by your physician -Make any legal decisions or sign important papers.  Meals: Start with liquid foods such as gelatin or soup. Progress to regular foods as tolerated. Avoid greasy, spicy, heavy foods. If nausea and/or vomiting occur, drink only clear liquids until the nausea and/or vomiting subsides. Call your physician if vomiting continues.  Special Instructions/Symptoms: Your throat may feel dry or sore from the anesthesia or the breathing tube placed in your throat during surgery. If this causes discomfort, gargle with warm salt water. The discomfort should disappear within 24 hours.  If you had a scopolamine patch placed behind your ear for the management of post- operative nausea and/or  vomiting:  1. The medication in the patch is effective for 72 hours, after which it should be removed.  Wrap patch in a tissue and discard in the trash. Wash hands thoroughly with soap and water. 2. You may remove the patch earlier than 72 hours if you experience unpleasant side effects which may include dry mouth, dizziness or visual disturbances. 3. Avoid touching the patch. Wash your hands with soap and water after contact with the patch.

## 2019-06-27 NOTE — Anesthesia Postprocedure Evaluation (Signed)
Anesthesia Post Note  Patient: Leslie Salazar  Procedure(s) Performed: Fat grafting to right breast with excision of back excess skin (Right Breast) release of capsular contracture with repositioning of the right implant with possible exchange of right breast implant (Right Breast)     Patient location during evaluation: PACU Anesthesia Type: General Level of consciousness: awake and alert and oriented Pain management: pain level controlled Vital Signs Assessment: post-procedure vital signs reviewed and stable Respiratory status: spontaneous breathing, nonlabored ventilation and respiratory function stable Cardiovascular status: blood pressure returned to baseline and stable Postop Assessment: no apparent nausea or vomiting Anesthetic complications: no    Last Vitals:  Vitals:   06/27/19 1415 06/27/19 1430  BP: (!) 145/97   Pulse: (!) 104 87  Resp: 12 12  Temp:    SpO2: 97% 99%    Last Pain:  Vitals:   06/27/19 1430  TempSrc:   PainSc: 4                  Shakita Keir A.

## 2019-06-27 NOTE — Op Note (Signed)
DATE OF OPERATION: 06/27/2019  LOCATION: [place]  PREOPERATIVE DIAGNOSIS:  Right breast asymmetry and capsule contracture with excess skin of the back from the latissimus flap.  POSTOPERATIVE DIAGNOSIS: Same  PROCEDURE:  1. Release of right lateral breast capsule contracture 2. Placement procured fat to right breast for symmetry   SURGEON: Lyndee Leo Sanger Dillingham, DO  ASSISTANT: Phoebe Sharps, PA  EBL: 5 cc  CONDITION: Stable  COMPLICATIONS: None  INDICATION: The patient, Leslie Salazar, is a 54 y.o. female born on Oct 04, 1965, is here for treatment of right breast asymmetry after breast surgery for breast cancer.   PROCEDURE DETAILS:  The patient was seen prior to surgery and marked.  The IV antibiotics were given. The patient was taken to the operating room and given a general anesthetic. A standard time out was performed and all information was confirmed by those in the room. SCDs were placed.   The patient was prepped and draped with Betadine.  1% lidocaine was injected at the 2 previously noted 5 mm scars on the abdomen.  The 15 blade was used to make 2 small incisions and tumescent was infused into the abdominal fat layer.  After waiting several minutes the liposuction was performed.  The fat was procured into the revolve device.  The revolve device was used according to the manufacture guidelines to collect and clean the donor tissue.  The 15 blade was then used to make an incision at the medial aspect of the right breast.  An additional incision was made at the the lateral aspect of the right breast and then at the inframammary fold.  All incisions were 5 mm.    The scar tissue from the right lateral breast was released with the forked knife and 15 blade.  Liposuction was done through the inframammary incision to reach to the most lateral aspect of the mid axillary line.  There was excess tissue there and that was liposuction to relieve that bulge.  The fat  was then injected in the superior medial and superior lateral aspect of the right breast.  A total of 100 cc was infused.  Patient did really well with this and there looks to be some very nice symmetry.  Initial small amount was placed in the medial and medial inferior aspect of the right breast.  All incisions were then closed with the 5-0 Monocryl.  Patient was placed in a breast binder and an abdominal binder.  The patient was allowed to wake up and taken to recovery room in stable condition at the end of the case. The family was notified at the end of the case.   The advanced practice practitioner (APP) assisted throughout the case.  The APP was essential in retraction and counter traction when needed to make the case progress smoothly.  This retraction and assistance made it possible to see the tissue plans for the procedure.  The assistance was needed for blood control, tissue re-approximation and assisted with closure of the incision site.  The [place] was signed into law in 2016 which includes the topic of electronic health records.  This provides immediate access to information in MyChart.  This includes consultation notes, operative notes, office notes, lab results and pathology reports.  If you have any questions about what you read please let us know at your next visit or call us at the office.  We are right here with you.

## 2019-06-27 NOTE — Transfer of Care (Signed)
Immediate Anesthesia Transfer of Care Note  Patient: Leslie Salazar  Procedure(s) Performed: Fat grafting to right breast with excision of back excess skin (Right Breast) release of capsular contracture with repositioning of the right implant with possible exchange of right breast implant (Right Breast)  Patient Location: PACU  Anesthesia Type:General  Level of Consciousness: sedated  Airway & Oxygen Therapy: Patient Spontanous Breathing and Patient connected to face mask oxygen  Post-op Assessment: Report given to RN and Post -op Vital signs reviewed and stable  Post vital signs: Reviewed and stable  Last Vitals:  Vitals Value Taken Time  BP 162/109 06/27/19 1252  Temp    Pulse 116 06/27/19 1253  Resp    SpO2 100 % 06/27/19 1253  Vitals shown include unvalidated device data.  Last Pain:  Vitals:   06/27/19 0945  TempSrc: Oral  PainSc: 0-No pain         Complications: No apparent anesthesia complications

## 2019-06-28 ENCOUNTER — Encounter: Payer: Self-pay | Admitting: *Deleted

## 2019-06-29 ENCOUNTER — Encounter: Payer: Federal, State, Local not specified - PPO | Admitting: Plastic Surgery

## 2019-07-06 ENCOUNTER — Ambulatory Visit (INDEPENDENT_AMBULATORY_CARE_PROVIDER_SITE_OTHER): Payer: Federal, State, Local not specified - PPO | Admitting: Plastic Surgery

## 2019-07-06 ENCOUNTER — Encounter: Payer: Self-pay | Admitting: Plastic Surgery

## 2019-07-06 ENCOUNTER — Other Ambulatory Visit: Payer: Self-pay

## 2019-07-06 VITALS — BP 121/82 | HR 67 | Temp 97.7°F | Ht 62.0 in | Wt 154.4 lb

## 2019-07-06 DIAGNOSIS — N651 Disproportion of reconstructed breast: Secondary | ICD-10-CM

## 2019-07-06 NOTE — Progress Notes (Signed)
   Subjective:    Patient ID: Leslie Salazar, female    DOB: 15-Jul-1965, 54 y.o.   MRN: [id number]  The patient is a 54 year old female here for follow-up for her breast reconstruction.  She had some asymmetry and had fat grafting.  She is doing extremely well.  She is got very good symmetry.  It filled in the indentation on the right lateral aspect of the breast very nicely.  There is some firmness where the fat was placed.  No sign of infection.  All incisions healing nicely.  Patient says her pain is well controlled and her bowels are working well.     Review of Systems  Constitutional: Positive for activity change.  HENT: Negative.   Eyes: Negative.   Respiratory: Negative.   Cardiovascular: Negative.   Gastrointestinal: Negative.   Endocrine: Negative.   Genitourinary: Negative.   Musculoskeletal: Negative.   Skin: Positive for color change. Negative for wound.  Hematological: Negative.   Psychiatric/Behavioral: Negative.        Objective:   Physical Exam Vitals and nursing note reviewed.  Constitutional:      Appearance: Normal appearance.  HENT:     Head: Normocephalic and atraumatic.  Cardiovascular:     Rate and Rhythm: Normal rate.     Pulses: Normal pulses.  Pulmonary:     Effort: Pulmonary effort is normal.  Abdominal:     General: There is no distension.     Tenderness: There is abdominal tenderness.  Neurological:     General: No focal deficit present.     Mental Status: She is alert and oriented to person, place, and time.  Psychiatric:        Mood and Affect: Mood normal.        Behavior: Behavior normal.       Assessment & Plan:     ICD-10-CM   1. Breast asymmetry following reconstructive surgery  N65.1     I recommend for the patient to start massaging the right breast.  Continue with a spanks and sports bra.  Increase activity slowly.  Can go back to the gym next week but start with 10-minute sessions.  We will see her back in 2  weeks.  Pictures were obtained of the patient and placed in the chart with the patient's or guardian's permission.

## 2019-07-20 ENCOUNTER — Encounter: Payer: Self-pay | Admitting: Surgical

## 2019-07-20 ENCOUNTER — Other Ambulatory Visit: Payer: Self-pay

## 2019-07-20 ENCOUNTER — Ambulatory Visit (INDEPENDENT_AMBULATORY_CARE_PROVIDER_SITE_OTHER): Payer: Federal, State, Local not specified - PPO | Admitting: Surgical

## 2019-07-20 VITALS — BP 126/84 | HR 68 | Temp 97.3°F | Ht 62.0 in | Wt 154.0 lb

## 2019-07-20 DIAGNOSIS — N651 Disproportion of reconstructed breast: Secondary | ICD-10-CM

## 2019-07-20 DIAGNOSIS — Z9010 Acquired absence of unspecified breast and nipple: Secondary | ICD-10-CM

## 2019-07-20 NOTE — Progress Notes (Signed)
The patient is a 54 year old female here for follow-up on breast reconstruction.  On 06/27/2019 she underwent release of right lateral breast capsule contracture as well as placement of procured fat to the right breast for symmetry.  She is doing well overall, but does endorse some continued firmness of her right breast laterally near the axilla as well as medially inferior to previous incisions.  She does not have any fevers, chills, nausea, vomiting. She has a history of radiation to her right breast. She has been massaging the areas of firmness, but this has been minimally helpful.  On exam, she has a significant amount of firmness where fat was placed within the right breast near the axilla as well as the right breast medially.  It is not really tender to palpation.  There is no overlying skin changes.  She does not have any redness.  No dehiscence of incisions. Right axillary injected fat is firm and approximately 2 x 3 cm.  Right inferior medial breast injected fat is also firm Abdomen is nonecchymotic, no swelling noted.  Plan:  She likely has some fat necrosis and scarring, recommend continuing to massage with Vaseline, scar cream or unscented lotion.   Continue with spanks and sports bra to prevent swelling.  She can begin increasing her exercise as tolerated, start with low weights and progress slowly.  Use pain as guide, avoid complex exercises initially.  Pictures were obtained of the patient and placed in the chart with the patient's or guardian's permission. She has a follow-up scheduled for 1 month.

## 2019-08-09 ENCOUNTER — Other Ambulatory Visit: Payer: Self-pay

## 2019-08-10 ENCOUNTER — Encounter: Payer: Self-pay | Admitting: Obstetrics & Gynecology

## 2019-08-10 ENCOUNTER — Ambulatory Visit (INDEPENDENT_AMBULATORY_CARE_PROVIDER_SITE_OTHER): Payer: Federal, State, Local not specified - PPO | Admitting: Obstetrics & Gynecology

## 2019-08-10 VITALS — BP 130/84 | Ht 62.0 in | Wt 155.0 lb

## 2019-08-10 DIAGNOSIS — Z853 Personal history of malignant neoplasm of breast: Secondary | ICD-10-CM

## 2019-08-10 DIAGNOSIS — Z90722 Acquired absence of ovaries, bilateral: Secondary | ICD-10-CM | POA: Diagnosis not present

## 2019-08-10 DIAGNOSIS — N952 Postmenopausal atrophic vaginitis: Secondary | ICD-10-CM

## 2019-08-10 DIAGNOSIS — Z01419 Encounter for gynecological examination (general) (routine) without abnormal findings: Secondary | ICD-10-CM

## 2019-08-10 DIAGNOSIS — Z170 Estrogen receptor positive status [ER+]: Secondary | ICD-10-CM

## 2019-08-10 DIAGNOSIS — E663 Overweight: Secondary | ICD-10-CM

## 2019-08-10 DIAGNOSIS — Z780 Asymptomatic menopausal state: Secondary | ICD-10-CM | POA: Diagnosis not present

## 2019-08-10 DIAGNOSIS — C50911 Malignant neoplasm of unspecified site of right female breast: Secondary | ICD-10-CM

## 2019-08-10 MED ORDER — ESTRADIOL 10 MCG VA TABS: 1.0000 | ORAL_TABLET | VAGINAL | 4 refills | Status: DC

## 2019-08-10 NOTE — Progress Notes (Signed)
Leslie Salazar [facility] 1965/06/08 [id number]   History:    54 y.o. G1P1L1  Married  RP:  Established patient presenting for annual gyn exam   HPI:  Menopause, well on no HRT, no PMB.  No pelvic pain.  S/P BSO 08/2018.  Patho benign.  Rt Breast Infiltrating Ductal Ca, ER-PR positive.  Finished Tamoxifen x 1 1/2 yr.  BrCa1-2 negative.  Using Vagifem and Vit E in vagina to help with IC. BMI 28.35.  Gym with trainer 3 times a week.  Health labs with Fam MD.    Past medical history,surgical history, family history and social history were all reviewed and documented in the EPIC chart.  Gynecologic History No LMP recorded. Patient is postmenopausal.  Obstetric History OB History  Gravida Para Term Preterm AB Living  _0 SAB TAB Ectopic Multiple Live Births          1    # Outcome Date GA Lbr Len/2nd Weight Sex Delivery Anes PTL Lv  1 Term     M CS-Unspec  N LIV     ROS: A ROS was performed and pertinent positives and negatives are included in the history.  GENERAL: No fevers or chills. HEENT: No change in vision, no earache, sore throat or sinus congestion. NECK: No pain or stiffness. CARDIOVASCULAR: No chest pain or pressure. No palpitations. PULMONARY: No shortness of breath, cough or wheeze. GASTROINTESTINAL: No abdominal pain, nausea, vomiting or diarrhea, melena or bright red blood per rectum. GENITOURINARY: No urinary frequency, urgency, hesitancy or dysuria. MUSCULOSKELETAL: No joint or muscle pain, no back pain, no recent trauma. DERMATOLOGIC: No rash, no itching, no lesions. ENDOCRINE: No polyuria, polydipsia, no heat or cold intolerance. No recent change in weight. HEMATOLOGICAL: No anemia or easy bruising or bleeding. NEUROLOGIC: No headache, seizures, numbness, tingling or weakness. PSYCHIATRIC: No depression, no loss of interest in normal activity or change in sleep pattern.     Exam:   BP 130/84   Ht _1  (1.575 m)   Wt 155 lb (70.3 kg)   BMI 28.35 kg/m   Body  mass index is 28.35 kg/m.  General appearance : Well developed well nourished female. No acute distress HEENT: Eyes: no retinal hemorrhage or exudates,  Neck supple, trachea midline, no carotid bruits, no thyroidmegaly Lungs: Clear to auscultation, no rhonchi or wheezes, or rib retractions  Heart: Regular rate and rhythm, no murmurs or gallops Breast:Examined in sitting and supine position were symmetrical in appearance s/p breast reconstruction bilaterally, no palpable masses or tenderness,  no skin retraction, no nipple inversion, no nipple discharge, no skin discoloration, no axillary or supraclavicular lymphadenopathy Abdomen: no palpable masses or tenderness, no rebound or guarding Extremities: no edema or skin discoloration or tenderness  Pelvic: Vulva: Normal             Vagina: No gross lesions or discharge  Cervix: No gross lesions or discharge  Uterus  AV, normal size, shape and consistency, non-tender and mobile  Adnexa  Without masses or tenderness  Anus: Normal   Assessment/Plan:  54 y.o. female for annual exam   1. Well female exam with routine gynecological exam Normal gynecologic exam in menopause.  Pap test February 2020 was negative, will repeat at 2 to 3 years.  Breast exam status post bilateral reconstruction.  Health labs with family physician.  2. Postmenopausal Well on no hormone replacement therapy.  No postmenopausal bleeding.  Bone density in 2018 was  normal, will repeat at age 69.  Vitamin D supplements, calcium intake of 1200 mg daily and regular weightbearing physical activity is recommended.  3. S/P BSO (bilateral salpingo-oophorectomy) Pathology benign.    4. Postmenopausal atrophic vaginitis We will continue on Vagifem twice weekly.  Agreement from oncologist for patient to continue on it.  Prescription sent to pharmacy.  5. Infiltrating ductal carcinoma of right breast, estrogen receptor positive, stage 1 ([place]) Followed by oncology.  6. Overweight  (BMI 25.0-29.9) Recommend a lower calorie/carb diet.  Continue with fitness.  Other orders - Estradiol 10 MCG TABS vaginal tablet; Place 1 tablet (10 mcg total) vaginally 2 (two) times a week.  Princess Bruins MD, 9:35 AM 08/10/2019

## 2019-08-10 NOTE — Patient Instructions (Signed)
1. Well female exam with routine gynecological exam Normal gynecologic exam in menopause.  Pap test February 2020 was negative, will repeat at 2 to 3 years.  Breast exam status post bilateral reconstruction.  Health labs with family physician.  2. Postmenopausal Well on no hormone replacement therapy.  No postmenopausal bleeding.  Bone density in 2018 was normal, will repeat at age 54.  Vitamin D supplements, calcium intake of 1200 mg daily and regular weightbearing physical activity is recommended.  3. S/P BSO (bilateral salpingo-oophorectomy) Pathology benign.    4. Postmenopausal atrophic vaginitis We will continue on Vagifem twice weekly.  Agreement from oncologist for patient to continue on it.  Prescription sent to pharmacy.  5. Infiltrating ductal carcinoma of right breast, estrogen receptor positive, stage 1 ([place]) Followed by oncology.  6. Overweight (BMI 25.0-29.9) Recommend a lower calorie/carb diet.  Continue with fitness.  Other orders - Estradiol 10 MCG TABS vaginal tablet; Place 1 tablet (10 mcg total) vaginally 2 (two) times a week.  Leslie Salazar, it was a pleasure seeing you today!

## 2019-08-14 ENCOUNTER — Other Ambulatory Visit: Payer: Self-pay | Admitting: Internal Medicine

## 2019-08-17 DIAGNOSIS — T50Z95A Adverse effect of other vaccines and biological substances, initial encounter: Secondary | ICD-10-CM | POA: Diagnosis not present

## 2019-08-17 DIAGNOSIS — T148XXA Other injury of unspecified body region, initial encounter: Secondary | ICD-10-CM | POA: Diagnosis not present

## 2019-08-21 ENCOUNTER — Ambulatory Visit: Payer: Federal, State, Local not specified - PPO | Admitting: Surgical

## 2019-08-24 ENCOUNTER — Ambulatory Visit (INDEPENDENT_AMBULATORY_CARE_PROVIDER_SITE_OTHER): Payer: Federal, State, Local not specified - PPO | Admitting: Surgical

## 2019-08-24 ENCOUNTER — Other Ambulatory Visit: Payer: Self-pay

## 2019-08-24 ENCOUNTER — Encounter: Payer: Self-pay | Admitting: Surgical

## 2019-08-24 VITALS — BP 128/90 | HR 97 | Temp 97.1°F | Ht 62.0 in | Wt 155.8 lb

## 2019-08-24 DIAGNOSIS — Z9010 Acquired absence of unspecified breast and nipple: Secondary | ICD-10-CM

## 2019-08-24 DIAGNOSIS — N651 Disproportion of reconstructed breast: Secondary | ICD-10-CM

## 2019-08-24 NOTE — Progress Notes (Signed)
Patient is a 54 year old female here for follow-up on her breast reconstruction. Patient has a history of radiation to right breast, as well as a right latissimus flap in 2014.  Patient is doing really well today, she reports that that hardness and some of the scarring has improved.  She does not notice any areas of hard fat tissue.  She reports that she went to a tattoo artist and [facility] and had some microneedling done.  She reports this was to help with tattoo ink uptake for a tattoo she is planning in July.   She is also been working out with her trainer and reports that this has been going well, some exercises elicit some pain within her right axilla, but mostly she is doing well with this.  Chaperone present on exam On exam bilateral breast incisions are well-healed, she does have some dark pigmenting where she had microneedling.  The area where she had microneedling also has some dry skin.  There is no erythema.  There is no foul odor.  There is no drainage noted.  Bilateral NAC tattoos noted.  Plan:  Patient is doing really well, she is pleased with her progress.  She is pleased that the areas of hardness have begun to soften up.  There is no sign of any infection, seroma, hematoma.  She is planning 1 more session of microneedling in approximately 20 days and then she is planning to have a full chest tattoo in July.  Call with any questions or concerns.  Follow-up in September 2021.

## 2019-08-25 ENCOUNTER — Other Ambulatory Visit: Payer: Self-pay

## 2019-08-27 MED ORDER — VALACYCLOVIR HCL 500 MG PO TABS: 500.0000 mg | ORAL_TABLET | Freq: Every day | ORAL | 2 refills | Status: DC

## 2019-09-05 ENCOUNTER — Other Ambulatory Visit: Payer: Self-pay

## 2019-09-05 ENCOUNTER — Ambulatory Visit (INDEPENDENT_AMBULATORY_CARE_PROVIDER_SITE_OTHER): Payer: Federal, State, Local not specified - PPO | Admitting: Internal Medicine

## 2019-09-05 ENCOUNTER — Encounter: Payer: Self-pay | Admitting: Internal Medicine

## 2019-09-05 ENCOUNTER — Telehealth: Payer: Self-pay

## 2019-09-05 VITALS — BP 142/70 | HR 84 | Temp 98.2°F | Ht 63.8 in | Wt 154.6 lb

## 2019-09-05 DIAGNOSIS — Z0000 Encounter for general adult medical examination without abnormal findings: Secondary | ICD-10-CM

## 2019-09-05 DIAGNOSIS — R05 Cough: Secondary | ICD-10-CM

## 2019-09-05 DIAGNOSIS — R059 Cough, unspecified: Secondary | ICD-10-CM

## 2019-09-05 DIAGNOSIS — I10 Essential (primary) hypertension: Secondary | ICD-10-CM

## 2019-09-05 LAB — POCT URINALYSIS DIPSTICK
Bilirubin, UA: NEGATIVE
Blood, UA: NEGATIVE
Glucose, UA: NEGATIVE
Ketones, UA: NEGATIVE
Leukocytes, UA: NEGATIVE
Nitrite, UA: NEGATIVE
Protein, UA: NEGATIVE
Spec Grav, UA: 1.025 (ref 1.010–1.025)
Urobilinogen, UA: 0.2 E.U./dL
pH, UA: 7 (ref 5.0–8.0)

## 2019-09-05 LAB — POCT UA - MICROALBUMIN
Albumin/Creatinine Ratio, Urine, POC: 30
Creatinine, POC: 200 mg/dL
Microalbumin Ur, POC: 10 mg/L

## 2019-09-05 MED ORDER — FAMOTIDINE 20 MG PO TABS: 20.0000 mg | ORAL_TABLET | Freq: Two times a day (BID) | ORAL | 1 refills | Status: DC

## 2019-09-05 NOTE — Telephone Encounter (Signed)
.   Pt understands that although there may be some limitations with this type of visit, we will take all precautions to reduce any security or privacy concerns.  Pt understands that this will be treated like an in office visit and we will file with pt's insurance, and there may be a patient responsible charge related to this service.   Verbal consent given for virtual visit 

## 2019-09-05 NOTE — Patient Instructions (Signed)
Health Maintenance, Female Adopting a healthy lifestyle and getting preventive care are important in promoting health and wellness. Ask your health care provider about:  The right schedule for you to have regular tests and exams.  Things you can do on your own to prevent diseases and keep yourself healthy. What should I know about diet, weight, and exercise? Eat a healthy diet   Eat a diet that includes plenty of vegetables, fruits, low-fat dairy products, and lean protein.  Do not eat a lot of foods that are high in solid fats, added sugars, or sodium. Maintain a healthy weight Body mass index (BMI) is used to identify weight problems. It estimates body fat based on height and weight. Your health care provider can help determine your BMI and help you achieve or maintain a healthy weight. Get regular exercise Get regular exercise. This is one of the most important things you can do for your health. Most adults should:  Exercise for at least 150 minutes each week. The exercise should increase your heart rate and make you sweat (moderate-intensity exercise).  Do strengthening exercises at least twice a week. This is in addition to the moderate-intensity exercise.  Spend less time sitting. Even light physical activity can be beneficial. Watch cholesterol and blood lipids Have your blood tested for lipids and cholesterol at 54 years of age, then have this test every 5 years. Have your cholesterol levels checked more often if:  Your lipid or cholesterol levels are high.  You are older than 53 years of age.  You are at high risk for heart disease. What should I know about cancer screening? Depending on your health history and family history, you may need to have cancer screening at various ages. This may include screening for:  Breast cancer.  Cervical cancer.  Colorectal cancer.  Skin cancer.  Lung cancer. What should I know about heart disease, diabetes, and high blood  pressure? Blood pressure and heart disease  High blood pressure causes heart disease and increases the risk of stroke. This is more likely to develop in people who have high blood pressure readings, are of African descent, or are overweight.  Have your blood pressure checked: ? Every 3-5 years if you are 18-39 years of age. ? Every year if you are 40 years old or older. Diabetes Have regular diabetes screenings. This checks your fasting blood sugar level. Have the screening done:  Once every three years after age 40 if you are at a normal weight and have a low risk for diabetes.  More often and at a younger age if you are overweight or have a high risk for diabetes. What should I know about preventing infection? Hepatitis B If you have a higher risk for hepatitis B, you should be screened for this virus. Talk with your health care provider to find out if you are at risk for hepatitis B infection. Hepatitis C Testing is recommended for:  Everyone born from 1945 through 1965.  Anyone with known risk factors for hepatitis C. Sexually transmitted infections (STIs)  Get screened for STIs, including gonorrhea and chlamydia, if: ? You are sexually active and are younger than 53 years of age. ? You are older than 53 years of age and your health care provider tells you that you are at risk for this type of infection. ? Your sexual activity has changed since you were last screened, and you are at increased risk for chlamydia or gonorrhea. Ask your health care provider if   you are at risk.  Ask your health care provider about whether you are at high risk for HIV. Your health care provider may recommend a prescription medicine to help prevent HIV infection. If you choose to take medicine to prevent HIV, you should first get tested for HIV. You should then be tested every 3 months for as long as you are taking the medicine. Pregnancy  If you are about to stop having your period (premenopausal) and  you may become pregnant, seek counseling before you get pregnant.  Take 400 to 800 micrograms (mcg) of folic acid every day if you become pregnant.  Ask for birth control (contraception) if you want to prevent pregnancy. Osteoporosis and menopause Osteoporosis is a disease in which the bones lose minerals and strength with aging. This can result in bone fractures. If you are 65 years old or older, or if you are at risk for osteoporosis and fractures, ask your health care provider if you should:  Be screened for bone loss.  Take a calcium or vitamin D supplement to lower your risk of fractures.  Be given hormone replacement therapy (HRT) to treat symptoms of menopause. Follow these instructions at home: Lifestyle  Do not use any products that contain nicotine or tobacco, such as cigarettes, e-cigarettes, and chewing tobacco. If you need help quitting, ask your health care provider.  Do not use street drugs.  Do not share needles.  Ask your health care provider for help if you need support or information about quitting drugs. Alcohol use  Do not drink alcohol if: ? Your health care provider tells you not to drink. ? You are pregnant, may be pregnant, or are planning to become pregnant.  If you drink alcohol: ? Limit how much you use to 0-1 drink a day. ? Limit intake if you are breastfeeding.  Be aware of how much alcohol is in your drink. In the [place] one drink equals one 12 oz bottle of beer (355 mL), one 5 oz glass of wine (148 mL), or one 1 oz glass of hard liquor (44 mL). General instructions  Schedule regular health, dental, and eye exams.  Stay current with your vaccines.  Tell your health care provider if: ? You often feel depressed. ? You have ever been abused or do not feel safe at home. Summary  Adopting a healthy lifestyle and getting preventive care are important in promoting health and wellness.  Follow your health care provider's instructions about healthy  diet, exercising, and getting tested or screened for diseases.  Follow your health care provider's instructions on monitoring your cholesterol and blood pressure. This information is not intended to replace advice given to you by your health care provider. Make sure you discuss any questions you have with your health care provider. Document Revised: 03/22/2018 Document Reviewed: 03/22/2018 Elsevier Patient Education  2020 [employer]vier Inc.  

## 2019-09-05 NOTE — Progress Notes (Signed)
This visit occurred during the SARS-CoV-2 public health emergency.  Safety protocols were in place, including screening questions prior to the visit, additional usage of staff PPE, and extensive cleaning of exam room while observing appropriate contact time as indicated for disinfecting solutions.  Subjective:     Patient ID: Leslie Salazar , female    DOB: 1965-08-22 , 54 y.o.   MRN: [id number]   Chief Complaint  Patient presents with  . Annual Exam  . Hypertension    HPI  She is here today for a full physical exam. She is followed by GYN for her pelvic exams. She has h/o breast cancer and is s/p mastectomy and reconstructive surgery. She recently had revision earlier this year. She is now planning to get a tattoo to cover her scars.   Hypertension This is a chronic problem. The current episode started more than 1 year ago. The problem has been rapidly improving since onset. The problem is controlled. Pertinent negatives include no blurred vision, chest pain, headaches, neck pain, orthopnea or shortness of breath. Palpitations: states she had episode of palpitations - occurred about a month ago - reports had several cups of coffee that day, and one right before walking with her friend. no associated cp. no repeat occurrences.  Risk factors for coronary artery disease include post-menopausal state. The current treatment provides moderate improvement. There are no compliance problems.      Past Medical History:  Diagnosis Date  . Acne   . Cancer ([place]) 2012   RIGHT DUCTAL CARCINOMA INSITU..  . Complication of anesthesia   . Condyloma    ON BUTTOCKS  . 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 Fibroids   . Heart palpitations   . High cholesterol   . History of angina   . HSV-1 (herpes simplex virus 1) infection   . Hx of radiation therapy 2013   from april to may - six weeks  . Hypertension   . Left ovarian cyst   . PONV (postoperative nausea and  vomiting)    nausea no vomiting  . Scoliosis   . Urinary urgency   . Wears glasses      Family History  Problem Relation Age of Onset  . Colon cancer Paternal Aunt   . Diabetes Mother        HAS HAD A KIDNEY TRANSPLATN  . Hypertension Mother   . Kidney failure Mother   . Hypertension Father   . Gallstones Father   . Gallstones Sister      Current Outpatient Medications:  .  acetaminophen (TYLENOL) 500 MG tablet, Take 500 mg by mouth every 6 (six) hours as needed., Disp: , Rfl:  .  aspirin EC 81 MG tablet, Take 81 mg by mouth every morning., Disp: , Rfl:  .  atorvastatin (LIPITOR) 20 MG tablet, Take 1 tablet (20 mg total) by mouth daily., Disp: 90 tablet, Rfl: 31 .  Cholecalciferol (VITAMIN D-1000 MAX ST) 1000 units tablet, Take 1 tablet by mouth daily. 2000u, Disp: , Rfl:  .  Coenzyme Q10 100 MG capsule, Take 1 capsule by mouth daily., Disp: , Rfl:  .  Estradiol 10 MCG TABS vaginal tablet, Place 1 tablet (10 mcg total) vaginally 2 (two) times a week., Disp: 24 tablet, Rfl: 4 .  hydrochlorothiazide (MICROZIDE) 12.5 MG capsule, TAKE 1 CAPSULE BY MOUTH EVERY DAY, Disp: 90 capsule, Rfl: 1 .  hydrocortisone (ANUSOL-HC) 2.5 % rectal cream, Place  1 application rectally 2 (two) times daily. (Patient taking differently: Place 1 application rectally as needed. ), Disp: 30 g, Rfl: 3 .  ibuprofen (ADVIL) 600 MG tablet, Take 600 mg by mouth every 6 (six) hours as needed., Disp: , Rfl:  .  loratadine (CLARITIN) 10 MG tablet, Take 10 mg by mouth every morning. , Disp: , Rfl:  .  metoprolol succinate (TOPROL-XL) 25 MG 24 hr tablet, TAKE 1 TABLET(25 MG) BY MOUTH DAILY, Disp: 90 tablet, Rfl: 1 .  Multiple Vitamins-Minerals (MULTIVITAMIN WOMENS 50+ ADV PO), Take by mouth., Disp: , Rfl:  .  Omega-3 Fatty Acids (FISH OIL PO), Take by mouth. 1 capsule daily, Disp: , Rfl:  .  Specialty Vitamins Products (JOINT/BONE VITALITY PO), , Disp: , Rfl:  .  valACYclovir (VALTREX) 500 MG tablet, Take 1 tablet (500  mg total) by mouth daily., Disp: 90 tablet, Rfl: 2   No Known Allergies     Review of Systems  Constitutional: Negative.   HENT: Negative.   Eyes: Negative.  Negative for blurred vision.  Respiratory: Positive for cough. Negative for shortness of breath.        She c/o persistent dry cough, especially upon awakening in the am. She has been taking her allergy meds w/o improvement. She read this could be due to reflux, denies heartburn.   Cardiovascular: Negative.  Negative for chest pain and orthopnea. Palpitations: states she had episode of palpitations - occurred about a month ago - reports had several cups of coffee that day, and one right before walking with her friend. no associated cp. no repeat occurrences.   Gastrointestinal: Negative.   Genitourinary: Negative.   Musculoskeletal: Negative.  Negative for neck pain.  Skin: Negative.   Neurological: Negative.  Negative for headaches.  Psychiatric/Behavioral: Negative.     Today's Vitals   09/05/19 1108  BP: (!) 142/70  Pulse: 84  Temp: 98.2 F (36.8 C)  TempSrc: Oral  Weight: 154 lb 9.6 oz (70.1 kg)  Height: 5' 3.8" (1.621 m)   Body mass index is 26.7 kg/m.   Objective:  Physical Exam Vitals and nursing note reviewed.  Constitutional:      Appearance: Normal appearance.  HENT:     Head: Normocephalic and atraumatic.     Right Ear: Tympanic membrane, ear canal and external ear normal.     Left Ear: Tympanic membrane, ear canal and external ear normal.     Nose:     Comments: Deferred, masked    Mouth/Throat:     Comments: Deferred, masked Eyes:     Extraocular Movements: Extraocular movements intact.     Conjunctiva/sclera: Conjunctivae normal.     Pupils: Pupils are equal, round, and reactive to light.  Cardiovascular:     Rate and Rhythm: Normal rate and regular rhythm.     Pulses: Normal pulses.     Heart sounds: Normal heart sounds.  Pulmonary:     Effort: Pulmonary effort is normal.     Breath sounds:  Normal breath sounds.  Chest:     Comments: S/p b/l mastectomy and reconstructive surgery. Healed surgical scars.  Abdominal:     General: Abdomen is flat. Bowel sounds are normal.     Palpations: Abdomen is soft.  Genitourinary:    Comments: deferred Musculoskeletal:        General: Normal range of motion.     Cervical back: Normal range of motion and neck supple.  Skin:    General: Skin is warm and dry.  Neurological:     General: No focal deficit present.     Mental Status: She is alert and oriented to person, place, and time.  Psychiatric:        Mood and Affect: Mood normal.        Behavior: Behavior normal.         Assessment And Plan:     1. Routine general medical examination at health care facility  A full exam was performed. PATIENT IS ADVISED TO GET 30-45 MINUTES REGULAR EXERCISE NO LESS THAN FOUR TO FIVE DAYS PER WEEK - BOTH WEIGHTBEARING EXERCISES AND AEROBIC ARE RECOMMENDED.  SHE IS ADVISED TO FOLLOW A HEALTHY DIET WITH AT LEAST SIX FRUITS/VEGGIES PER DAY, DECREASE INTAKE OF RED MEAT, AND TO INCREASE FISH INTAKE TO TWO DAYS PER WEEK.  MEATS/FISH SHOULD NOT BE FRIED, BAKED OR BROILED IS PREFERABLE.  I SUGGEST WEARING SPF 50 SUNSCREEN ON EXPOSED PARTS AND ESPECIALLY WHEN IN THE DIRECT SUNLIGHT FOR AN EXTENDED PERIOD OF TIME.  PLEASE AVOID FAST FOOD RESTAURANTS AND INCREASE YOUR WATER INTAKE.  - CMP14+EGFR - CBC - Lipid panel  2. Essential hypertension, benign  Chronic, uncontrolled today. She will continue with current meds for now. She thinks BP elevation is due to her workout right before her visit. EKG not performed, 06/2019 pre-operative EKG was reviewed in full detail. She is encouraged to check her BP readings 1-2 times per week. She will let me know if they continue to be elevated. She will rto in six months for re-evaluation.   - POCT Urinalysis Dipstick (81002) - POCT UA - Microalbumin   3. Cough  Her sx are suggestive of GERD.  She was given rx pepcid  '20mg'$  once daily. She will rto in four weeks for re-evaluation.    Maximino Greenland, MD    THE PATIENT IS ENCOURAGED TO PRACTICE SOCIAL DISTANCING DUE TO THE COVID-19 PANDEMIC.

## 2019-09-12 DIAGNOSIS — Z0000 Encounter for general adult medical examination without abnormal findings: Secondary | ICD-10-CM | POA: Diagnosis not present

## 2019-09-12 DIAGNOSIS — I10 Essential (primary) hypertension: Secondary | ICD-10-CM | POA: Diagnosis not present

## 2019-09-12 DIAGNOSIS — Z79899 Other long term (current) drug therapy: Secondary | ICD-10-CM | POA: Diagnosis not present

## 2019-09-13 LAB — CMP14+EGFR
ALT: 22 IU/L (ref 0–32)
AST: 20 IU/L (ref 0–40)
Albumin/Globulin Ratio: 2.2 (ref 1.2–2.2)
Albumin: 4.8 g/dL (ref 3.8–4.9)
Alkaline Phosphatase: 86 IU/L (ref 48–121)
BUN/Creatinine Ratio: 20 (ref 9–23)
BUN: 16 mg/dL (ref 6–24)
Bilirubin Total: 0.8 mg/dL (ref 0.0–1.2)
CO2: 26 mmol/L (ref 20–29)
Calcium: 9.9 mg/dL (ref 8.7–10.2)
Chloride: 103 mmol/L (ref 96–106)
Creatinine, Ser: 0.81 mg/dL (ref 0.57–1.00)
GFR calc Af Amer: 96 mL/min/{1.73_m2} (ref >59)
GFR calc non Af Amer: 83 mL/min/{1.73_m2} (ref >59)
Globulin, Total: 2.2 g/dL (ref 1.5–4.5)
Glucose: 72 mg/dL (ref 65–99)
Potassium: 4 mmol/L (ref 3.5–5.2)
Sodium: 141 mmol/L (ref 134–144)
Total Protein: 7 g/dL (ref 6.0–8.5)

## 2019-09-13 LAB — CBC
Hematocrit: 40.6 % (ref 34.0–46.6)
Hemoglobin: 13.9 g/dL (ref 11.1–15.9)
MCH: 30.4 pg (ref 26.6–33.0)
MCHC: 34.2 g/dL (ref 31.5–35.7)
MCV: 89 fL (ref 79–97)
Platelets: 233 10*3/uL (ref 150–450)
RBC: 4.57 x10E6/uL (ref 3.77–5.28)
RDW: 12.9 % (ref 11.7–15.4)
WBC: 6.5 10*3/uL (ref 3.4–10.8)

## 2019-09-13 LAB — LIPID PANEL
Chol/HDL Ratio: 2.5 ratio (ref 0.0–4.4)
Cholesterol, Total: 163 mg/dL (ref 100–199)
HDL: 66 mg/dL (ref >39)
LDL Chol Calc (NIH): 80 mg/dL (ref 0–99)
Triglycerides: 92 mg/dL (ref 0–149)
VLDL Cholesterol Cal: 17 mg/dL (ref 5–40)

## 2019-09-17 ENCOUNTER — Ambulatory Visit: Payer: Federal, State, Local not specified - PPO

## 2019-10-04 DIAGNOSIS — K080 Exfoliation of teeth due to systemic causes: Secondary | ICD-10-CM | POA: Diagnosis not present

## 2019-10-17 ENCOUNTER — Telehealth: Payer: Federal, State, Local not specified - PPO | Admitting: Internal Medicine

## 2019-10-21 ENCOUNTER — Other Ambulatory Visit: Payer: Self-pay | Admitting: Internal Medicine

## 2019-11-18 DIAGNOSIS — H00032 Abscess of right lower eyelid: Secondary | ICD-10-CM | POA: Diagnosis not present

## 2019-11-20 DIAGNOSIS — H00022 Hordeolum internum right lower eyelid: Secondary | ICD-10-CM | POA: Diagnosis not present

## 2019-12-26 ENCOUNTER — Other Ambulatory Visit: Payer: Self-pay

## 2019-12-26 ENCOUNTER — Encounter: Payer: Self-pay | Admitting: Surgical

## 2019-12-26 ENCOUNTER — Ambulatory Visit (INDEPENDENT_AMBULATORY_CARE_PROVIDER_SITE_OTHER): Payer: Federal, State, Local not specified - PPO | Admitting: Surgical

## 2019-12-26 VITALS — BP 132/88 | HR 76 | Temp 98.6°F

## 2019-12-26 DIAGNOSIS — Z9010 Acquired absence of unspecified breast and nipple: Secondary | ICD-10-CM

## 2019-12-26 DIAGNOSIS — N651 Disproportion of reconstructed breast: Secondary | ICD-10-CM | POA: Diagnosis not present

## 2019-12-26 NOTE — Progress Notes (Signed)
   Subjective:     Patient ID: Leslie Salazar, female    DOB: 1966/02/19, 54 y.o.   MRN: [id number]  Chief Complaint  Patient presents with  . Follow-up    HPI: The patient is a 54 y.o. female here for follow-up on her bilateral breast reconstruction.  Patient underwent right latissimus muscle flap (11/29/2012) and has bilateral breast implants in place.  Patient was diagnosed with infiltrating ductal carcinoma of the right breast in 2012.  Patient also underwent right-sided radiation that ended 08/30/2011.  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  Patient is doing well from fat grafting and release of capsular contracture on 06/2019.  Patient recently had a full breast/mastectomy tattoo, she is very pleased with the results and reports she has been doing really well.  She reports that she sometimes has pain within the axilla of the right breast, but no other complaints.   Review of Systems  Constitutional: Negative.   Respiratory: Negative.   Skin: Negative.      Objective:   Vital Signs There were no vitals taken for this visit. Vital Signs and Nursing Note Reviewed Chaperone present Physical Exam Constitutional:      General: She is not in acute distress.    Appearance: Normal appearance. She is not ill-appearing.  Pulmonary:     Effort: Pulmonary effort is normal.  Chest:     Breasts:        Right: Absent. No swelling or mass.        Left: Absent. No swelling or mass.     Comments: Well-healed bilateral breast scars, bilateral breast/chest tattoo.  Mild tenderness to palpation of right axillary region.  No erythema.  No swelling noted.  No abnormal lumps noted. Abdominal:     General: Abdomen is flat. There is no distension.  Skin:    General: Skin is warm.  Neurological:     General: No focal deficit present.     Mental Status: She is alert and oriented  to person, place, and time.  Psychiatric:        Mood and Affect: Mood normal.        Behavior: Behavior normal.     Assessment/Plan:     ICD-10-CM   1. Breast asymmetry following reconstructive surgery  N65.1   2. Acquired absence of breast and absent nipple, unspecified laterality  Z90.10     Patient is doing really well, she is very pleased with her reconstruction. There is no sign of any infection, seroma.  Bilateral breast incisions are well-healed.  No areas of fat necrosis noted with palpation.  Discussed with patient we can plan to follow-up as needed, she is in agreement with this plan.  Discussed that if she has any questions at all or would like to be evaluated for any changes she is welcome to call us to schedule an appointment.  Recommended calling with any questions or concerns Pictures were obtained of the patient and placed in the chart with the patient's or guardian's permission.    Carola Rhine [place], PA-C 12/26/2019, 9:05 AM

## 2020-02-03 ENCOUNTER — Other Ambulatory Visit: Payer: Self-pay | Admitting: Internal Medicine

## 2020-03-12 ENCOUNTER — Ambulatory Visit (INDEPENDENT_AMBULATORY_CARE_PROVIDER_SITE_OTHER): Payer: Federal, State, Local not specified - PPO | Admitting: Internal Medicine

## 2020-03-12 ENCOUNTER — Encounter: Payer: Self-pay | Admitting: Internal Medicine

## 2020-03-12 ENCOUNTER — Other Ambulatory Visit: Payer: Self-pay

## 2020-03-12 VITALS — BP 138/84 | HR 71 | Temp 98.5°F | Ht 63.8 in | Wt 159.8 lb

## 2020-03-12 DIAGNOSIS — R635 Abnormal weight gain: Secondary | ICD-10-CM | POA: Diagnosis not present

## 2020-03-12 DIAGNOSIS — Z6827 Body mass index (BMI) 27.0-27.9, adult: Secondary | ICD-10-CM

## 2020-03-12 DIAGNOSIS — I10 Essential (primary) hypertension: Secondary | ICD-10-CM

## 2020-03-12 DIAGNOSIS — Z1159 Encounter for screening for other viral diseases: Secondary | ICD-10-CM

## 2020-03-12 DIAGNOSIS — E663 Overweight: Secondary | ICD-10-CM

## 2020-03-12 DIAGNOSIS — N952 Postmenopausal atrophic vaginitis: Secondary | ICD-10-CM | POA: Diagnosis not present

## 2020-03-12 MED ORDER — ATORVASTATIN CALCIUM 20 MG PO TABS: 20.0000 mg | ORAL_TABLET | Freq: Every day | ORAL | 1 refills | Status: DC

## 2020-03-12 MED ORDER — HYDROCHLOROTHIAZIDE 12.5 MG PO CAPS: 12.5000 mg | ORAL_CAPSULE | Freq: Every day | ORAL | 1 refills | Status: DC

## 2020-03-12 MED ORDER — METOPROLOL SUCCINATE ER 25 MG PO TB24: ORAL_TABLET | ORAL | 1 refills | Status: DC

## 2020-03-12 NOTE — Progress Notes (Signed)
I,Katawbba Wiggins,acting as a Education administrator for Maximino Greenland, MD.,have documented all relevant documentation on the behalf of Maximino Greenland, MD,as directed by  Maximino Greenland, MD while in the presence of Maximino Greenland, MD.  This visit occurred during the SARS-CoV-2 public health emergency.  Safety protocols were in place, including screening questions prior to the visit, additional usage of staff PPE, and extensive cleaning of exam room while observing appropriate contact time as indicated for disinfecting solutions.  Subjective:     Patient ID: Leslie Salazar , female    DOB: January 02, 1966 , 54 y.o.   MRN: [id number]   Chief Complaint  Patient presents with  . Hypertension  . Obesity    HPI  She presents today for BP check. Reports compliance with meds. She is frustrated b/c she has been exercising regularly -but hasn't made any progress with weight loss.   Hypertension This is a chronic problem. The current episode started more than 1 year ago. The problem has been rapidly improving since onset. The problem is controlled. Pertinent negatives include no blurred vision, chest pain, headaches, neck pain, orthopnea or shortness of breath. Palpitations: states she had episode of palpitations - occurred about a month ago - reports had several cups of coffee that day, and one right before walking with her friend. no associated cp. no repeat occurrences.  Risk factors for coronary artery disease include post-menopausal state. The current treatment provides moderate improvement. There are no compliance problems.      Past Medical History:  Diagnosis Date  . Acne   . Cancer ([place]) 2012   RIGHT DUCTAL CARCINOMA INSITU..  . Complication of anesthesia   . Condyloma    ON BUTTOCKS  . 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 Fibroids   . Heart palpitations   . High cholesterol   . History of angina   . HSV-1 (herpes simplex virus 1) infection   . Hx of radiation  therapy 2013   from april to may - six weeks  . Hypertension   . Left ovarian cyst   . PONV (postoperative nausea and vomiting)    nausea no vomiting  . Scoliosis   . Urinary urgency   . Wears glasses      Family History  Problem Relation Age of Onset  . Colon cancer Paternal Aunt   . Diabetes Mother        HAS HAD A KIDNEY TRANSPLATN  . Hypertension Mother   . Kidney failure Mother   . Hypertension Father   . Gallstones Father   . Gallstones Sister      Current Outpatient Medications:  .  acetaminophen (TYLENOL) 500 MG tablet, Take 500 mg by mouth every 6 (six) hours as needed., Disp: , Rfl:  .  aspirin EC 81 MG tablet, Take 81 mg by mouth every morning., Disp: , Rfl:  .  atorvastatin (LIPITOR) 20 MG tablet, Take 1 tablet (20 mg total) by mouth daily., Disp: 90 tablet, Rfl: 1 .  Cholecalciferol (VITAMIN D-1000 MAX ST) 1000 units tablet, Take 1 tablet by mouth daily. 2000u, Disp: , Rfl:  .  Coenzyme Q10 100 MG capsule, Take 1 capsule by mouth daily., Disp: , Rfl:  .  Estradiol 10 MCG TABS vaginal tablet, Place 1 tablet (10 mcg total) vaginally 2 (two) times a week., Disp: 24 tablet, Rfl: 4 .  hydrochlorothiazide (MICROZIDE) 12.5 MG capsule, Take 1 capsule (12.5 mg  total) by mouth daily., Disp: 90 capsule, Rfl: 1 .  hydrocortisone (ANUSOL-HC) 2.5 % rectal cream, Place 1 application rectally 2 (two) times daily. (Patient taking differently: Place 1 application rectally as needed. ), Disp: 30 g, Rfl: 3 .  ibuprofen (ADVIL) 600 MG tablet, Take 600 mg by mouth every 6 (six) hours as needed., Disp: , Rfl:  .  loratadine (CLARITIN) 10 MG tablet, Take 10 mg by mouth every morning. , Disp: , Rfl:  .  metoprolol succinate (TOPROL-XL) 25 MG 24 hr tablet, TAKE 1 TABLET(25 MG) BY MOUTH DAILY, Disp: 90 tablet, Rfl: 1 .  Multiple Vitamins-Minerals (MULTIVITAMIN WOMENS 50+ ADV PO), Take by mouth., Disp: , Rfl:  .  Omega-3 Fatty Acids (FISH OIL PO), Take by mouth. 1 capsule daily, Disp: , Rfl:   .  valACYclovir (VALTREX) 500 MG tablet, Take 1 tablet (500 mg total) by mouth daily., Disp: 90 tablet, Rfl: 2   No Known Allergies   Review of Systems  Constitutional: Negative.   Eyes: Negative for blurred vision.  Respiratory: Negative.  Negative for shortness of breath.   Cardiovascular: Negative.  Negative for chest pain and orthopnea. Palpitations: states she had episode of palpitations - occurred about a month ago - reports had several cups of coffee that day, and one right before walking with her friend. no associated cp. no repeat occurrences.   Gastrointestinal: Negative.   Musculoskeletal: Negative for neck pain.  Neurological: Negative for headaches.  Psychiatric/Behavioral: Negative.   All other systems reviewed and are negative.    Today's Vitals   03/12/20 1129  BP: 138/84  Pulse: 71  Temp: 98.5 F (36.9 C)  TempSrc: Oral  Weight: 159 lb 12.8 oz (72.5 kg)  Height: 5' 3.8" (1.621 m)   Body mass index is 27.6 kg/m.  Wt Readings from Last 3 Encounters:  03/12/20 159 lb 12.8 oz (72.5 kg)  09/05/19 154 lb 9.6 oz (70.1 kg)  08/24/19 155 lb 12.8 oz (70.7 kg)   Objective:  Physical Exam Vitals and nursing note reviewed.  Constitutional:      Appearance: Normal appearance.  HENT:     Head: Normocephalic and atraumatic.  Cardiovascular:     Rate and Rhythm: Normal rate and regular rhythm.     Heart sounds: Normal heart sounds.  Pulmonary:     Breath sounds: Normal breath sounds.  Musculoskeletal:     Cervical back: Normal range of motion.  Skin:    General: Skin is warm.  Neurological:     General: No focal deficit present.     Mental Status: She is alert and oriented to person, place, and time.         Assessment And Plan:     1. Essential hypertension, benign Comments: Chronic, fair control. She is aware that optimal BP is less than 130/80.  I will check renal function today.  - BMP8+EGFR - TSH  2. Weight gain Comments: She is encouraged to  continue with her regular exercise regimen. Advised to cook more meals at home and eat out less.  I will check thyroid function.  3. Postmenopausal atrophic vaginitis Comments: She is now on vaginal estradiol as per her GYN. Her sx have improved since its use.  4. Overweight with body mass index (BMI) of 27 to 27.9 in adult Comments: Please see #2.   5. Encounter for HCV screening test for low risk patient Comments: I will check HCV antibody today.  - Hepatitis C antibody    Patient  was given opportunity to ask questions. Patient verbalized understanding of the plan and was able to repeat key elements of the plan. All questions were answered to their satisfaction.  Maximino Greenland, MD   I, Maximino Greenland, MD, have reviewed all documentation for this visit. The documentation on 03/29/20 for the exam, diagnosis, procedures, and orders are all accurate and complete.  THE PATIENT IS ENCOURAGED TO PRACTICE SOCIAL DISTANCING DUE TO THE COVID-19 PANDEMIC.

## 2020-03-12 NOTE — Patient Instructions (Signed)
Ginger/turmeric tea Lemon water Dinner: lean protein/green veggies

## 2020-03-13 LAB — BMP8+EGFR
BUN/Creatinine Ratio: 16 (ref 9–23)
BUN: 14 mg/dL (ref 6–24)
CO2: 23 mmol/L (ref 20–29)
Calcium: 10 mg/dL (ref 8.7–10.2)
Chloride: 100 mmol/L (ref 96–106)
Creatinine, Ser: 0.85 mg/dL (ref 0.57–1.00)
GFR calc Af Amer: 90 mL/min/{1.73_m2} (ref >59)
GFR calc non Af Amer: 78 mL/min/{1.73_m2} (ref >59)
Glucose: 73 mg/dL (ref 65–99)
Potassium: 3.8 mmol/L (ref 3.5–5.2)
Sodium: 139 mmol/L (ref 134–144)

## 2020-03-13 LAB — HEPATITIS C ANTIBODY: Hep C Virus Ab: <0.1 s/co ratio (ref 0.0–0.9)

## 2020-03-13 LAB — TSH: TSH: 1.15 u[IU]/mL (ref 0.450–4.500)

## 2020-04-10 ENCOUNTER — Other Ambulatory Visit: Payer: Federal, State, Local not specified - PPO

## 2020-04-10 ENCOUNTER — Other Ambulatory Visit: Payer: Self-pay

## 2020-04-10 DIAGNOSIS — Z20822 Contact with and (suspected) exposure to covid-19: Secondary | ICD-10-CM | POA: Diagnosis not present

## 2020-04-10 DIAGNOSIS — R4586 Emotional lability: Secondary | ICD-10-CM

## 2020-04-10 NOTE — Patient Instructions (Signed)
Depression Screening Depression screening is a tool that your health care provider can use to learn if you have symptoms of depression. Depression is a common condition with many symptoms that are also often found in other conditions. Depression is treatable, but it must first be diagnosed. You may not know that certain feelings, thoughts, and behaviors that you are having can be symptoms of depression. Taking a depression screening test can help you and your health care provider decide if you need more assessment, or if you should be referred to a mental health care provider. What are the screening tests?  You may have a physical exam to see if another condition is affecting your mental health. You may have a blood or urine sample taken during the physical exam.  You may be interviewed using a screening tool that was developed from research, such as one of these: ? Patient Health Questionnaire (PHQ). This is a set of either 2 or 9 questions. A health care provider who has been trained to score this screening test uses a guide to assess if your symptoms suggest that you may have depression. ? Hamilton Depression Rating Scale (HAM-D). This is a set of either 17 or 24 questions. You may be asked to take it again during or after your treatment, to see if your depression has gotten better. ? Beck Depression Inventory (BDI). This is a set of 21 multiple choice questions. Your health care provider scores your answers to assess:  Your level of depression, ranging from mild to severe.  Your response to treatment.  Your health care provider may talk with you about your daily activities, such as eating, sleeping, work, and recreation, and ask if you have had any changes in activity.  Your health care provider may ask you to see a mental health specialist, such as a psychiatrist or psychologist, for more evaluation. Who should be screened for depression?   All adults, including adults with a family history  of a mental health disorder.  Adolescents who are 12-18 years old.  People who are recovering from a myocardial infarction (MI).  Pregnant women, or women who have given birth.  People who have a long-term (chronic) illness.  Anyone who has been diagnosed with another type of a mental health disorder.  Anyone who has symptoms that could show depression. What do my results mean? Your health care provider will review the results of your depression screening, physical exam, and lab tests. Positive screens suggest that you may have depression. Screening is the first step in getting the care that you may need. It is up to you to get your screening results. Ask your health care provider, or the department that is doing your screening tests, when your results will be ready. Talk with your health care provider about your results and diagnosis. A diagnosis of depression is made using the Diagnostic and Statistical Manual of Mental Disorders (DSM-V). This is a book that lists the number and type of symptoms that must be present for a health care provider to give a specific diagnosis.  Your health care provider may work with you to treat your symptoms of depression, or your health care provider may help you find a mental health provider who can assess, diagnose, and treat your depression. Get help right away if:  You have thoughts about hurting yourself or others. If you ever feel like you may hurt yourself or others, or have thoughts about taking your own life, get help right away. You   can go to your nearest emergency department or call:  Your local emergency services (911 in the [place]  A suicide crisis helpline, such as the National Suicide Prevention Lifeline at 1-800-273-8255. This is open 24 hours a day. Summary  Depression screening is the first step in getting the help that you may need.  If your screening test shows symptoms of depression (is positive), your health care provider may ask  you to see a mental health provider.  Anyone who is age 12 or older should be screened for depression. This information is not intended to replace advice given to you by your health care provider. Make sure you discuss any questions you have with your health care provider. Document Revised: 03/11/2017 Document Reviewed: 08/13/2016 Elsevier Patient Education  2020 [employer]  

## 2020-04-11 LAB — SARS-COV-2, NAA 2 DAY TAT

## 2020-04-11 LAB — NOVEL CORONAVIRUS, NAA: SARS-CoV-2, NAA: DETECTED — AB

## 2020-04-12 ENCOUNTER — Encounter: Payer: Self-pay | Admitting: Internal Medicine

## 2020-04-18 ENCOUNTER — Other Ambulatory Visit: Payer: Federal, State, Local not specified - PPO

## 2020-04-18 DIAGNOSIS — Z20822 Contact with and (suspected) exposure to covid-19: Secondary | ICD-10-CM

## 2020-04-22 LAB — NOVEL CORONAVIRUS, NAA

## 2020-04-25 ENCOUNTER — Other Ambulatory Visit: Payer: Federal, State, Local not specified - PPO

## 2020-04-25 DIAGNOSIS — Z20822 Contact with and (suspected) exposure to covid-19: Secondary | ICD-10-CM | POA: Diagnosis not present

## 2020-04-30 LAB — NOVEL CORONAVIRUS, NAA: SARS-CoV-2, NAA: NOT DETECTED

## 2020-05-07 DIAGNOSIS — N898 Other specified noninflammatory disorders of vagina: Secondary | ICD-10-CM | POA: Diagnosis not present

## 2020-05-07 DIAGNOSIS — Z780 Asymptomatic menopausal state: Secondary | ICD-10-CM | POA: Diagnosis not present

## 2020-05-07 DIAGNOSIS — Z9189 Other specified personal risk factors, not elsewhere classified: Secondary | ICD-10-CM | POA: Diagnosis not present

## 2020-05-07 DIAGNOSIS — A6004 Herpesviral vulvovaginitis: Secondary | ICD-10-CM | POA: Diagnosis not present

## 2020-05-25 ENCOUNTER — Other Ambulatory Visit: Payer: Self-pay | Admitting: Obstetrics & Gynecology

## 2020-05-26 NOTE — Telephone Encounter (Signed)
Annual exam scheduled on 08/11/20

## 2020-06-17 ENCOUNTER — Ambulatory Visit (INDEPENDENT_AMBULATORY_CARE_PROVIDER_SITE_OTHER): Payer: Federal, State, Local not specified - PPO | Admitting: Internal Medicine

## 2020-06-17 ENCOUNTER — Other Ambulatory Visit: Payer: Self-pay

## 2020-06-17 ENCOUNTER — Encounter: Payer: Self-pay | Admitting: Internal Medicine

## 2020-06-17 VITALS — BP 142/96 | HR 91 | Temp 98.5°F | Ht 63.8 in | Wt 162.0 lb

## 2020-06-17 DIAGNOSIS — L989 Disorder of the skin and subcutaneous tissue, unspecified: Secondary | ICD-10-CM

## 2020-06-17 DIAGNOSIS — I10 Essential (primary) hypertension: Secondary | ICD-10-CM

## 2020-06-17 DIAGNOSIS — R202 Paresthesia of skin: Secondary | ICD-10-CM | POA: Diagnosis not present

## 2020-06-17 DIAGNOSIS — Z6827 Body mass index (BMI) 27.0-27.9, adult: Secondary | ICD-10-CM | POA: Diagnosis not present

## 2020-06-17 DIAGNOSIS — R635 Abnormal weight gain: Secondary | ICD-10-CM | POA: Diagnosis not present

## 2020-06-17 NOTE — Patient Instructions (Addendum)
Dr. Fung/Intermittent fasting   Hypertension, Adult Hypertension is another name for high blood pressure. High blood pressure forces your heart to work harder to pump blood. This can cause problems over time. There are two numbers in a blood pressure reading. There is a top number (systolic) over a bottom number (diastolic). It is best to have a blood pressure that is below 120/80. Healthy choices can help lower your blood pressure, or you may need medicine to help lower it. What are the causes? The cause of this condition is not known. Some conditions may be related to high blood pressure. What increases the risk?  Smoking.  Having type 2 diabetes mellitus, high cholesterol, or both.  Not getting enough exercise or physical activity.  Being overweight.  Having too much fat, sugar, calories, or salt (sodium) in your diet.  Drinking too much alcohol.  Having long-term (chronic) kidney disease.  Having a family history of high blood pressure.  Age. Risk increases with age.  Race. You may be at higher risk if you are African American.  Gender. Men are at higher risk than women before age 50. After age 30, women are at higher risk than men.  Having obstructive sleep apnea.  Stress. What are the signs or symptoms?  High blood pressure may not cause symptoms. Very high blood pressure (hypertensive crisis) may cause: ? Headache. ? Feelings of worry or nervousness (anxiety). ? Shortness of breath. ? Nosebleed. ? A feeling of being sick to your stomach (nausea). ? Throwing up (vomiting). ? Changes in how you see. ? Very bad chest pain. ? Seizures. How is this treated?  This condition is treated by making healthy lifestyle changes, such as: ? Eating healthy foods. ? Exercising more. ? Drinking less alcohol.  Your health care provider may prescribe medicine if lifestyle changes are not enough to get your blood pressure under control, and if: ? Your top number is above  130. ? Your bottom number is above 80.  Your personal target blood pressure may vary. Follow these instructions at home: Eating and drinking  If told, follow the DASH eating plan. To follow this plan: ? Fill one half of your plate at each meal with fruits and vegetables. ? Fill one fourth of your plate at each meal with whole grains. Whole grains include whole-wheat pasta, brown rice, and whole-grain bread. ? Eat or drink low-fat dairy products, such as skim milk or low-fat yogurt. ? Fill one fourth of your plate at each meal with low-fat (lean) proteins. Low-fat proteins include fish, chicken without skin, eggs, beans, and tofu. ? Avoid fatty meat, cured and processed meat, or chicken with skin. ? Avoid pre-made or processed food.  Eat less than 1,500 mg of salt each day.  Do not drink alcohol if: ? Your doctor tells you not to drink. ? You are pregnant, may be pregnant, or are planning to become pregnant.  If you drink alcohol: ? Limit how much you use to:  0-1 drink a day for women.  0-2 drinks a day for men. ? Be aware of how much alcohol is in your drink. In the [place] one drink equals one 12 oz bottle of beer (355 mL), one 5 oz glass of wine (148 mL), or one 1 oz glass of hard liquor (44 mL).   Lifestyle  Work with your doctor to stay at a healthy weight or to lose weight. Ask your doctor what the best weight is for you.  Get at least  30 minutes of exercise most days of the week. This may include walking, swimming, or biking.  Get at least 30 minutes of exercise that strengthens your muscles (resistance exercise) at least 3 days a week. This may include lifting weights or doing Pilates.  Do not use any products that contain nicotine or tobacco, such as cigarettes, e-cigarettes, and chewing tobacco. If you need help quitting, ask your doctor.  Check your blood pressure at home as told by your doctor.  Keep all follow-up visits as told by your doctor. This is important.    Medicines  Take over-the-counter and prescription medicines only as told by your doctor. Follow directions carefully.  Do not skip doses of blood pressure medicine. The medicine does not work as well if you skip doses. Skipping doses also puts you at risk for problems.  Ask your doctor about side effects or reactions to medicines that you should watch for. Contact a doctor if you:  Think you are having a reaction to the medicine you are taking.  Have headaches that keep coming back (recurring).  Feel dizzy.  Have swelling in your ankles.  Have trouble with your vision. Get help right away if you:  Get a very bad headache.  Start to feel mixed up (confused).  Feel weak or numb.  Feel faint.  Have very bad pain in your: ? Chest. ? Belly (abdomen).  Throw up more than once.  Have trouble breathing. Summary  Hypertension is another name for high blood pressure.  High blood pressure forces your heart to work harder to pump blood.  For most people, a normal blood pressure is less than 120/80.  Making healthy choices can help lower blood pressure. If your blood pressure does not get lower with healthy choices, you may need to take medicine. This information is not intended to replace advice given to you by your health care provider. Make sure you discuss any questions you have with your health care provider. Document Revised: 12/07/2017 Document Reviewed: 12/07/2017 Elsevier Patient Education  2021 [employer].

## 2020-06-17 NOTE — Progress Notes (Addendum)
I,Katawbba Wiggins,acting as a Education administrator for Maximino Greenland, MD.,have documented all relevant documentation on the behalf of Maximino Greenland, MD,as directed by  Maximino Greenland, MD while in the presence of Maximino Greenland, MD.  This visit occurred during the SARS-CoV-2 public health emergency.  Safety protocols were in place, including screening questions prior to the visit, additional usage of staff PPE, and extensive cleaning of exam room while observing appropriate contact time as indicated for disinfecting solutions.  Subjective:     Patient ID: Leslie Salazar , female    DOB: 05-Jun-1965 , 55 y.o.   MRN: [id number]   Chief Complaint  Patient presents with  . Hypertension    HPI  She presents today for BP check.  She reports compliance with meds. States she took her meds about 1.5 hr ago. She takes with food. Denies headaches, chest pain and shortness of breath. Admits she has been eating out more than usual.   Hypertension This is a chronic problem. The current episode started more than 1 year ago. The problem has been rapidly improving since onset. The problem is controlled. Pertinent negatives include no blurred vision, chest pain, headaches, neck pain, orthopnea or shortness of breath. Palpitations: states she had episode of palpitations - occurred about a month ago - reports had several cups of coffee that day, and one right before walking with her friend. no associated cp. no repeat occurrences.  Risk factors for coronary artery disease include post-menopausal state. The current treatment provides moderate improvement. There are no compliance problems.      Past Medical History:  Diagnosis Date  . Acne   . Cancer ([place]) 2012   RIGHT DUCTAL CARCINOMA INSITU..  . Complication of anesthesia   . Condyloma    ON BUTTOCKS  . 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 Fibroids   . Heart palpitations   . High cholesterol   . History of angina   . HSV-1  (herpes simplex virus 1) infection   . Hx of radiation therapy 2013   from april to may - six weeks  . Hypertension   . Left ovarian cyst   . PONV (postoperative nausea and vomiting)    nausea no vomiting  . Scoliosis   . Urinary urgency   . Wears glasses      Family History  Problem Relation Age of Onset  . Colon cancer Paternal Aunt   . Diabetes Mother        HAS HAD A KIDNEY TRANSPLATN  . Hypertension Mother   . Kidney failure Mother   . Hypertension Father   . Gallstones Father   . Gallstones Sister      Current Outpatient Medications:  .  acetaminophen (TYLENOL) 500 MG tablet, Take 500 mg by mouth every 6 (six) hours as needed., Disp: , Rfl:  .  aspirin EC 81 MG tablet, Take 81 mg by mouth every morning., Disp: , Rfl:  .  atorvastatin (LIPITOR) 20 MG tablet, Take 1 tablet (20 mg total) by mouth daily., Disp: 90 tablet, Rfl: 1 .  Cholecalciferol 25 MCG (1000 UT) tablet, Take 1 tablet by mouth daily. 2000u, Disp: , Rfl:  .  Coenzyme Q10 100 MG capsule, Take 1 capsule by mouth daily., Disp: , Rfl:  .  Estradiol 10 MCG TABS vaginal tablet, Place 1 tablet (10 mcg total) vaginally 2 (two) times a week., Disp: 24 tablet, Rfl: 4 .  hydrochlorothiazide (MICROZIDE) 12.5 MG capsule, Take 1 capsule (12.5 mg total) by mouth daily., Disp: 90 capsule, Rfl: 1 .  hydrocortisone (ANUSOL-HC) 2.5 % rectal cream, Place 1 application rectally 2 (two) times daily. (Patient taking differently: Place 1 application rectally as needed.), Disp: 30 g, Rfl: 3 .  ibuprofen (ADVIL) 600 MG tablet, Take 600 mg by mouth every 6 (six) hours as needed., Disp: , Rfl:  .  imiquimod (ALDARA) 5 % cream, Apply to affected area overnight  three times weekly as needed, Disp: 12 each, Rfl: 1 .  loratadine (CLARITIN) 10 MG tablet, Take 10 mg by mouth every morning. , Disp: , Rfl:  .  metoprolol succinate (TOPROL-XL) 25 MG 24 hr tablet, TAKE 1 TABLET(25 MG) BY MOUTH DAILY, Disp: 90 tablet, Rfl: 1 .  Multiple  Vitamins-Minerals (MULTIVITAMIN WOMENS 50+ ADV PO), Take by mouth., Disp: , Rfl:  .  Omega-3 Fatty Acids (FISH OIL PO), Take by mouth. 1 capsule daily, Disp: , Rfl:  .  valACYclovir (VALTREX) 500 MG tablet, TAKE 1 TABLET(500 MG) BY MOUTH DAILY, Disp: 90 tablet, Rfl: 0   No Known Allergies   Review of Systems  Constitutional: Negative.   Eyes: Negative for blurred vision.  Respiratory: Negative.  Negative for shortness of breath.   Cardiovascular: Negative.  Negative for chest pain and orthopnea. Palpitations: states she had episode of palpitations - occurred about a month ago - reports had several cups of coffee that day, and one right before walking with her friend. no associated cp. no repeat occurrences.   Gastrointestinal: Negative.   Musculoskeletal: Negative for neck pain.  Skin:       She c/o skin tag on rectum. She first noticed this about a month ago. She denies itching. She notices it when bathing.   Neurological: Positive for numbness (right arm). Negative for headaches.       She c/o right upper arm numbness.  It only occurs during sleep. She does admit to right - sided neck pain as well. She awakens with numbness/tingling - sx resolve as she starts to move around. Sx started "awhile" ago.   Psychiatric/Behavioral: Negative.   All other systems reviewed and are negative.    Today's Vitals   06/17/20 1112  BP: (!) 142/96  Pulse: 91  Temp: 98.5 F (36.9 C)  TempSrc: Oral  Weight: 162 lb (73.5 kg)  Height: 5' 3.8" (1.621 m)   Body mass index is 27.98 kg/m.  Wt Readings from Last 3 Encounters:  06/17/20 162 lb (73.5 kg)  03/12/20 159 lb 12.8 oz (72.5 kg)  09/05/19 154 lb 9.6 oz (70.1 kg)   Objective:  Physical Exam Vitals and nursing note reviewed.  Constitutional:      Appearance: Normal appearance.  HENT:     Head: Normocephalic and atraumatic.     Nose:     Comments: Masked     Mouth/Throat:     Comments: Masked  Cardiovascular:     Rate and Rhythm: Normal  rate and regular rhythm.     Heart sounds: Normal heart sounds.  Pulmonary:     Breath sounds: Normal breath sounds.  Musculoskeletal:     Cervical back: Normal range of motion.  Skin:    General: Skin is warm.     Comments: Pedunculated  flesh-colored lesion, scaly located above anus  Neurological:     General: No focal deficit present.     Mental Status: She is alert and oriented to person, place, and time.  Assessment And Plan:     1. Adult BMI 27.0-27.9 kg/sq m Comments: Advised to consider intermittent fasting. Encouraged to read information from Dr. Luis Abed.  She is also encouraged to cook more of her meals at home, and to consider eating out a "treat".   2. Paresthesia of right arm Comments: Thyroid function is normal. Sx suggestive of nerve impingement. She agrees to Neuro eval for EMG/NCS testing.  - Vitamin B12 - Ambulatory referral to Neurology  3. Essential hypertension, benign Comments: Uncontrolled. I will check labs as listed below. Encouraged to use salt free seasonings. I iwll not adjust meds at this time. Encouraged to check BP at home.   4. Skin lesion Comments: Suggestion of genital wart. Will send rx imiquimod to be used three days weekly. She plans to f/u with GYN.    Patient was given opportunity to ask questions. Patient verbalized understanding of the plan and was able to repeat key elements of the plan. All questions were answered to their satisfaction.   I, Maximino Greenland, MD, have reviewed all documentation for this visit. The documentation on 06/17/20 for the exam, diagnosis, procedures, and orders are all accurate and complete.  THE PATIENT IS ENCOURAGED TO PRACTICE SOCIAL DISTANCING DUE TO THE COVID-19 PANDEMIC.

## 2020-06-18 ENCOUNTER — Encounter: Payer: Self-pay | Admitting: Internal Medicine

## 2020-06-18 LAB — VITAMIN B12: Vitamin B-12: 1118 pg/mL (ref 232–1245)

## 2020-06-18 MED ORDER — IMIQUIMOD 5 % EX CREA: TOPICAL_CREAM | CUTANEOUS | 1 refills | Status: DC

## 2020-06-21 LAB — INSULIN, RANDOM: INSULIN: 8.2 u[IU]/mL (ref 2.6–24.9)

## 2020-06-21 LAB — SPECIMEN STATUS REPORT

## 2020-06-27 ENCOUNTER — Ambulatory Visit (INDEPENDENT_AMBULATORY_CARE_PROVIDER_SITE_OTHER): Payer: Federal, State, Local not specified - PPO | Admitting: Diagnostic Neuroimaging

## 2020-06-27 ENCOUNTER — Other Ambulatory Visit: Payer: Self-pay

## 2020-06-27 ENCOUNTER — Encounter: Payer: Self-pay | Admitting: Diagnostic Neuroimaging

## 2020-06-27 VITALS — BP 128/83 | HR 61 | Ht 62.0 in | Wt 158.0 lb

## 2020-06-27 DIAGNOSIS — R200 Anesthesia of skin: Secondary | ICD-10-CM

## 2020-06-27 DIAGNOSIS — R202 Paresthesia of skin: Secondary | ICD-10-CM | POA: Diagnosis not present

## 2020-06-27 NOTE — Progress Notes (Signed)
[facility]  PATIENT: JAMIRRA CURNOW DOB: Dec 12, 1965  REFERRING CLINICIAN: Glendale Chard, MD HISTORY FROM: Patient REASON FOR VISIT: New consult   HISTORICAL  CHIEF COMPLAINT:  Chief Complaint  Patient presents with  . Paresthesia of right arm    Rm 6 New Pt " hx right breast cancer; for past 6 months right arm goes completely numb, primarily at night, maybe due to my cancer related surgeries, no hx of chemotherapy"     HISTORY OF PRESENT ILLNESS:   55 year old female here for evaluation of right arm numbness.  Symptoms have been present for last 2-3 months.  Sometimes she wakes up in her right arm feels numb.  Entire arm to be affected.  She has some mild numbness and tingling sensation affecting her thumb and index finger more than the others.  Symptoms can radiate all the way up to her shoulder.  She has history of breast cancer and had extensive surgeries in the past, with good results.   REVIEW OF SYSTEMS: Full 14 system review of systems performed and negative with exception of: As per HPI.  ALLERGIES: No Known Allergies  HOME MEDICATIONS: Outpatient Medications Prior to Visit  Medication Sig Dispense Refill  . acetaminophen (TYLENOL) 500 MG tablet Take 500 mg by mouth every 6 (six) hours as needed.    Marland Kitchen aspirin EC 81 MG tablet Take 81 mg by mouth every morning.    Marland Kitchen atorvastatin (LIPITOR) 20 MG tablet Take 1 tablet (20 mg total) by mouth daily. 90 tablet 1  . Cholecalciferol 25 MCG (1000 UT) tablet Take 1 tablet by mouth daily. 2000u    . Coenzyme Q10 100 MG capsule Take 1 capsule by mouth daily.    . Estradiol 10 MCG TABS vaginal tablet Place 1 tablet (10 mcg total) vaginally 2 (two) times a week. 24 tablet 4  . hydrochlorothiazide (MICROZIDE) 12.5 MG capsule Take 1 capsule (12.5 mg total) by mouth daily. 90 capsule 1  . hydrocortisone (ANUSOL-HC) 2.5 % rectal cream Place 1 application rectally 2 (two) times daily. (Patient taking differently:  Place 1 application rectally as needed.) 30 g 3  . ibuprofen (ADVIL) 600 MG tablet Take 600 mg by mouth every 6 (six) hours as needed.    . imiquimod (ALDARA) 5 % cream Apply to affected area overnight  three times weekly as needed 12 each 1  . loratadine (CLARITIN) 10 MG tablet Take 10 mg by mouth every morning.     . metoprolol succinate (TOPROL-XL) 25 MG 24 hr tablet TAKE 1 TABLET(25 MG) BY MOUTH DAILY 90 tablet 1  . Multiple Vitamins-Minerals (MULTIVITAMIN WOMENS 50+ ADV PO) Take by mouth.    . Omega-3 Fatty Acids (FISH OIL PO) Take by mouth. 1 capsule daily    . valACYclovir (VALTREX) 500 MG tablet TAKE 1 TABLET(500 MG) BY MOUTH DAILY 90 tablet 0   No facility-administered medications prior to visit.    PAST MEDICAL HISTORY: Past Medical History:  Diagnosis Date  . Acne   . Cancer ([place]) 2012   RIGHT DUCTAL CARCINOMA INSITU..  . Complication of anesthesia   . Condyloma    ON BUTTOCKS  . 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 Fibroids   . Heart palpitations   . High cholesterol   . History of angina   . HSV-1 (herpes simplex virus 1) infection   . Hx of radiation therapy 2013   from april to may -  six weeks  . Hypertension   . Left ovarian cyst   . PONV (postoperative nausea and vomiting)    nausea no vomiting  . Scoliosis   . Urinary urgency   . Wears glasses     PAST SURGICAL HISTORY: Past Surgical History:  Procedure Laterality Date  . BREAST CAPSULECTOMY WITH IMPLANT EXCHANGE Right 06/27/2019   Procedure: release of capsular contracture with repositioning of the right implant with possible exchange of right breast implant;  Surgeon: Wallace Going, DO;  Location: [facility];  Service: Plastics;  Laterality: Right;  . BREAST RECONSTRUCTION WITH PLACEMENT OF TISSUE EXPANDER AND FLEX HD (ACELLULAR HYDRATED DERMIS) Right 11/16/2012   Procedure: RIGHT LATISSIMUS myocutaneous FLAP WITH PLACEMENT OF TISSUE EXPANDER  TO  RIGHT BREAST;  Surgeon: Theodoro Kos, DO;  Location: [facility];  Service: Plastics;  Laterality: Right;  . CARDIAC CATHETERIZATION N/A 02/28/2015   Procedure: Left Heart Cath and Coronary Angiography;  Surgeon: Adrian Prows, MD;  Location: [facility];  Service: Cardiovascular;  Laterality: N/A;  . CESAREAN SECTION  2000  . ENDOMETRIAL ABLATION W/ NOVASURE  2007  . INSERTION OF TISSUE EXPANDER AFTER MASTECTOMY Bilateral 04/20/2011   Breast Ca (r) T1b N0 M0 stage IA infiltrating ductal carcinoma  . LAPAROSCOPIC BILATERAL SALPINGO OOPHERECTOMY Bilateral 08/30/2018   Procedure: LAPAROSCOPIC BILATERAL SALPINGO OOPHORECTOMY w/ PERITONEAL WASHINGS;  Surgeon: Princess Bruins, MD;  Location: [facility];  Service: Gynecology;  Laterality: Bilateral;  . LASER ABLATION OF THE CERVIX  1994   FOR DYSPLASIA  . LIPOSUCTION WITH LIPOFILLING Right 03/14/2013   Procedure: LIPOSUCTION WITH LIPOFILLING;  Surgeon: Theodoro Kos, DO;  Location: [facility];  Service: Plastics;  Laterality: Right;  . LIPOSUCTION WITH LIPOFILLING Right 06/27/2019   Procedure: Fat grafting to right breast with excision of back excess skin;  Surgeon: Wallace Going, DO;  Location: [facility];  Service: Plastics;  Laterality: Right;  2 hours, please  . RECONSTRUCTION BREAST W/ LATISSIMUS DORSI FLAP Right 11/16/2012  . REMOVAL OF TISSUE EXPANDER AND PLACEMENT OF IMPLANT Right 03/14/2013   Procedure: REMOVAL OF RIGHT BREAST TISSUE EXPANDER WITH PLACEMENT OF RIGHT BREAST IMPLANT;  Surgeon: Theodoro Kos, DO;  Location: [facility];  Service: Plastics;  Laterality: Right;  Removal of Right Breast Tissue Expander with Placement of Right Breast Implant, Possible Lipsuction with Lipofilling  . TISSUE EXPANDER PLACEMENT Right 11/16/2012  . TISSUE EXPANDER REMOVAL Bilateral 12/2011  . TUBAL LIGATION  09/08/2001   BY LAPAROSCOPY ,, HULKA CLIPS TECHNIQUE  . WISDOM TOOTH EXTRACTION       FAMILY HISTORY: Family History  Problem Relation Age of Onset  . Colon cancer Paternal Aunt   . Diabetes Mother        HAS HAD A KIDNEY TRANSPLATN  . Hypertension Mother   . Kidney failure Mother   . Hypertension Father   . Gallstones Father   . Gallstones Sister     SOCIAL HISTORY: Social History   Socioeconomic History  . Marital status: Married    Spouse name: Not on file  . Number of children: 1  . Years of education: Not on file  . Highest education level: Master's degree (e.g., MA, MS, MEng, MEd, MSW, MBA)  Occupational History  . Not on file  Tobacco Use  . Smoking status: Never Smoker  . Smokeless tobacco: Never Used  Vaping Use  . Vaping Use: Never used  Substance and Sexual Activity  . Alcohol use:  Yes    Alcohol/week: 1.0 standard drink    Types: 1 Glasses of wine per week  . Drug use: No  . Sexual activity: Yes    Birth control/protection: Surgical, Condom    Comment: 1ST INTERCOURSE- 38, PARTNERS- 4  Other Topics Concern  . Not on file  Social History Narrative   Lives with husband   Social Determinants of Health   Financial Resource Strain: Not on file  Food Insecurity: Not on file  Transportation Needs: Not on file  Physical Activity: Not on file  Stress: Not on file  Social Connections: Not on file  Intimate Partner Violence: Not on file     PHYSICAL EXAM   GENERAL EXAM/CONSTITUTIONAL: Vitals:  Vitals:   06/27/20 1105  BP: 128/83  Pulse: 61  Weight: 158 lb (71.7 kg)  Height: _0  (1.575 m)   Body mass index is 28.9 kg/m. Wt Readings from Last 3 Encounters:  06/27/20 158 lb (71.7 kg)  06/17/20 162 lb (73.5 kg)  03/12/20 159 lb 12.8 oz (72.5 kg)    Patient is in no distress; well developed, nourished and groomed; neck is supple  CARDIOVASCULAR:  Examination of carotid arteries is normal; no carotid bruits  Regular rate and rhythm, no murmurs  Examination of peripheral vascular system by observation and palpation is  normal  EYES:  Ophthalmoscopic exam of optic discs and posterior segments is normal; no papilledema or hemorrhages No exam data present  MUSCULOSKELETAL:  Gait, strength, tone, movements noted in Neurologic exam below  NEUROLOGIC: MENTAL STATUS:  No flowsheet data found.  awake, alert, oriented to person, place and time  recent and remote memory intact  normal attention and concentration  language fluent, comprehension intact, naming intact  fund of knowledge appropriate  CRANIAL NERVE:   2nd - no papilledema on fundoscopic exam  2nd, 3rd, 4th, 6th - pupils equal and reactive to light, visual fields full to confrontation, extraocular muscles intact, no nystagmus  5th - facial sensation symmetric  7th - facial strength symmetric  8th - hearing intact  9th - palate elevates symmetrically, uvula midline  11th - shoulder shrug symmetric  12th - tongue protrusion midline  MOTOR:   normal bulk and tone, full strength in the BUE, BLE  MILD FLATTENING OF THENAR EMINENCE ON RIGHT    SENSORY:   normal and symmetric to light touch, pinprick, temperature, vibration; EXCEPT DECR PP IN RIGHT HAND  BORDERLINE PHALEN'S SIGN ON RIGHT  COORDINATION:   finger-nose-finger, fine finger movements normal  REFLEXES:   deep tendon reflexes TRACE and symmetric  GAIT/STATION:   narrow based gait; able to walk on toes, heels and tandem; romberg is negative     DIAGNOSTIC DATA (LABS, IMAGING, TESTING) - I reviewed patient records, labs, notes, testing and imaging myself where available.  Lab Results  Component Value Date   WBC 6.5 09/12/2019   HGB 13.9 09/12/2019   HCT 40.6 09/12/2019   MCV 89 09/12/2019   PLT 233 09/12/2019      Component Value Date/Time   NA 139 03/12/2020 1528   K 3.8 03/12/2020 1528   CL 100 03/12/2020 1528   CO2 23 03/12/2020 1528   GLUCOSE 73 03/12/2020 1528   GLUCOSE 96 06/22/2019 0936   BUN 14 03/12/2020 1528   CREATININE 0.85  03/12/2020 1528   CALCIUM 10.0 03/12/2020 1528   PROT 7.0 09/12/2019 1132   ALBUMIN 4.8 09/12/2019 1132   AST 20 09/12/2019 1132   ALT 22 09/12/2019  1132   ALKPHOS 86 09/12/2019 1132   BILITOT 0.8 09/12/2019 1132   GFRNONAA 78 03/12/2020 1528   GFRAA 90 03/12/2020 1528   Lab Results  Component Value Date   CHOL 163 09/12/2019   HDL 66 09/12/2019   LDLCALC 80 09/12/2019   TRIG 92 09/12/2019   CHOLHDL 2.5 09/12/2019   Lab Results  Component Value Date   HGBA1C 5.4 02/16/2018   Lab Results  Component Value Date   VITAMINB12 1,118 06/17/2020   Lab Results  Component Value Date   TSH 1.150 03/12/2020       ASSESSMENT AND PLAN  55 y.o. year old female here with:  Dx:  1. Numbness and tingling in right hand     PLAN:  RIGHT ARM NUMBNESS (wake up from sleep; since ~2-3 months)  - check EMG/NCS (eval carpal tunnel syndrome vs cervical radiculopathy)  Orders Placed This Encounter  Procedures  . NCV with EMG(electromyography)   Return for for NCV/EMG.    Penni Bombard, MD 8/56/3149, 70:26 AM Certified in Neurology, Neurophysiology and Neuroimaging  [facility] [address] [place], [place] [address] [phone]

## 2020-07-17 ENCOUNTER — Other Ambulatory Visit: Payer: Self-pay

## 2020-07-17 ENCOUNTER — Encounter (INDEPENDENT_AMBULATORY_CARE_PROVIDER_SITE_OTHER): Payer: Federal, State, Local not specified - PPO | Admitting: Diagnostic Neuroimaging

## 2020-07-17 ENCOUNTER — Ambulatory Visit (INDEPENDENT_AMBULATORY_CARE_PROVIDER_SITE_OTHER): Payer: Federal, State, Local not specified - PPO | Admitting: Diagnostic Neuroimaging

## 2020-07-17 DIAGNOSIS — R202 Paresthesia of skin: Secondary | ICD-10-CM

## 2020-07-17 DIAGNOSIS — R200 Anesthesia of skin: Secondary | ICD-10-CM

## 2020-07-17 DIAGNOSIS — Z0289 Encounter for other administrative examinations: Secondary | ICD-10-CM

## 2020-07-24 NOTE — Procedures (Signed)
   [facility]  NCS (NERVE CONDUCTION STUDY) WITH EMG (ELECTROMYOGRAPHY) REPORT   STUDY DATE: 07/17/20 PATIENT NAME: Leslie Salazar DOB: [birth year] MRN: [id number]  ORDERING CLINICIAN: Andrey Spearman, MD   TECHNOLOGIST: Sherre Scarlet ELECTROMYOGRAPHER: Earlean Polka. Nysia Dell, MD  CLINICAL INFORMATION: 55 year old female with right arm numbness and tingling.   FINDINGS: NERVE CONDUCTION STUDY:  Right median and right ulnar motor responses are normal.  Right median and right ulnar sensory responses are normal.  Right median to ulnar transcarpal comparison study is normal.  Right ulnar F-wave latency is normal.   NEEDLE ELECTROMYOGRAPHY:  Needle examination of right upper extremity is normal.   IMPRESSION:   Normal study.  No electrodiagnostic evidence of large fiber neuropathy at this time.    INTERPRETING PHYSICIAN:  Penni Bombard, MD Certified in Neurology, Neurophysiology and Neuroimaging  [facility] [address] [place], [place] [address] [phone]   [facility]    Nerve / Sites Muscle Latency Ref. Amplitude Ref. Rel Amp Segments Distance Velocity Ref. Area    ms ms mV mV %  cm m/s m/s mVms  R Median - APB     Wrist APB 3.2 ?4.4 9.7 ?4.0 100 Wrist - APB 7   35.3     Upper arm APB 6.8  9.5  97.8 Upper arm - Wrist 23 64 ?49 32.5  R Ulnar - ADM     Wrist ADM 2.3 ?3.3 11.7 ?6.0 100 Wrist - ADM 7   38.0     B.Elbow ADM 5.4  11.2  96.2 B.Elbow - Wrist 19 62 ?49 37.3     A.Elbow ADM 7.1  11.6  103 A.Elbow - B.Elbow 10 61 ?49 36.6         SNC    Nerve / Sites Rec. Site Peak Lat Ref.  Amp Ref. Segments Distance Peak Diff Ref.    ms ms V V  cm ms ms  R Median, Ulnar - Transcarpal comparison     Median Palm Wrist 1.9 ?2.2 117 ?35 Median Palm - Wrist 8       Ulnar Palm Wrist 1.8 ?2.2 77 ?12 Ulnar Palm - Wrist 8          Median Palm - Ulnar Palm  0.1 ?0.4  R Median - Orthodromic (Dig II, Mid palm)     Dig II  Wrist 2.8 ?3.4 44 ?10 Dig II - Wrist 13    R Ulnar - Orthodromic, (Dig V, Mid palm)     Dig V Wrist 2.3 ?3.1 27 ?5 Dig V - Wrist 7             F  Wave    Nerve F Lat Ref.   ms ms  R Ulnar - ADM 25.7 ?32.0       EMG Summary Table    Spontaneous MUAP Recruitment  Muscle IA Fib PSW Fasc Other Amp Dur. Poly Pattern  R. Deltoid Normal None None None _______ Normal Normal Normal Normal  R. Biceps brachii Normal None None None _______ Normal Normal Normal Normal  R. Triceps brachii Normal None None None _______ Normal Normal Normal Normal  R. Flexor carpi radialis Normal None None None _______ Normal Normal Normal Normal  R. First dorsal interosseous Normal None None None _______ Normal Normal Normal Normal

## 2020-08-11 ENCOUNTER — Encounter: Payer: Federal, State, Local not specified - PPO | Admitting: Obstetrics & Gynecology

## 2020-08-24 ENCOUNTER — Other Ambulatory Visit: Payer: Self-pay

## 2020-08-25 MED ORDER — VALACYCLOVIR HCL 500 MG PO TABS: 500.0000 mg | ORAL_TABLET | Freq: Every day | ORAL | 0 refills | Status: DC

## 2020-08-25 NOTE — Telephone Encounter (Signed)
Annual exam scheduled on 09/04/20 

## 2020-09-04 ENCOUNTER — Other Ambulatory Visit (HOSPITAL_COMMUNITY)
Admission: RE | Admit: 2020-09-04 | Discharge: 2020-09-04 | Disposition: A | Discharge status: Home / Self Care | Payer: Federal, State, Local not specified - PPO | Source: Ambulatory Visit | Attending: Obstetrics & Gynecology | Admitting: Obstetrics & Gynecology

## 2020-09-04 ENCOUNTER — Ambulatory Visit (INDEPENDENT_AMBULATORY_CARE_PROVIDER_SITE_OTHER): Payer: Federal, State, Local not specified - PPO | Admitting: Obstetrics & Gynecology

## 2020-09-04 ENCOUNTER — Encounter: Payer: Self-pay | Admitting: Obstetrics & Gynecology

## 2020-09-04 ENCOUNTER — Other Ambulatory Visit: Payer: Self-pay

## 2020-09-04 VITALS — BP 126/82 | Ht 62.0 in | Wt 161.0 lb

## 2020-09-04 DIAGNOSIS — L989 Disorder of the skin and subcutaneous tissue, unspecified: Secondary | ICD-10-CM

## 2020-09-04 DIAGNOSIS — Z90722 Acquired absence of ovaries, bilateral: Secondary | ICD-10-CM

## 2020-09-04 DIAGNOSIS — Z780 Asymptomatic menopausal state: Secondary | ICD-10-CM | POA: Diagnosis not present

## 2020-09-04 DIAGNOSIS — N952 Postmenopausal atrophic vaginitis: Secondary | ICD-10-CM | POA: Diagnosis not present

## 2020-09-04 DIAGNOSIS — Z170 Estrogen receptor positive status [ER+]: Secondary | ICD-10-CM

## 2020-09-04 DIAGNOSIS — Z8619 Personal history of other infectious and parasitic diseases: Secondary | ICD-10-CM

## 2020-09-04 DIAGNOSIS — Z9079 Acquired absence of other genital organ(s): Secondary | ICD-10-CM

## 2020-09-04 DIAGNOSIS — C50911 Malignant neoplasm of unspecified site of right female breast: Secondary | ICD-10-CM

## 2020-09-04 DIAGNOSIS — Z01419 Encounter for gynecological examination (general) (routine) without abnormal findings: Secondary | ICD-10-CM

## 2020-09-04 DIAGNOSIS — A630 Anogenital (venereal) warts: Secondary | ICD-10-CM

## 2020-09-04 MED ORDER — VALACYCLOVIR HCL 500 MG PO TABS: 500.0000 mg | ORAL_TABLET | Freq: Every day | ORAL | 4 refills | Status: DC

## 2020-09-04 MED ORDER — ESTRADIOL 10 MCG VA TABS: 1.0000 | ORAL_TABLET | VAGINAL | 4 refills | Status: DC

## 2020-09-04 MED ORDER — IMIQUIMOD 5 % EX CREA: TOPICAL_CREAM | CUTANEOUS | 1 refills | Status: AC

## 2020-09-04 NOTE — Progress Notes (Signed)
Leslie Salazar 1965/11/09 [id number]   History:    55 y.o. G1P1L1 Married  WK:[id number] presenting for annual gyn exam   YBF:XOVANVBTYOMAY, well on no HRT, no PMB. No pelvic pain.  S/P BSO 08/2018.  Patho benign. Rt Breast Infiltrating Ductal Ca, ER-PR positive. Finished Tamoxifen >2 yrs ago. BrCa1-2 negative. Had bilateral breast reconstruction with tattoos. Using Vagifem and Vit E in vagina to help with IC. Aldara for perianal wart.  Valtrex prophylaxis.  BMI 29.45. Gym with trainer 3 times a week. Health labs with Fam MD.  Leslie Salazar 2017.    Past medical history,surgical history, family history and social history were all reviewed and documented in the EPIC chart.  Gynecologic History No LMP recorded. Patient is postmenopausal.  Obstetric History OB History  Gravida Para Term Preterm AB Living  $Remov'1 1 1     1  'yrwEVv$ SAB IAB Ectopic Multiple Live Births          1    # Outcome Date GA Lbr Len/2nd Weight Sex Delivery Anes PTL Lv  1 Term     M CS-Unspec  N LIV     ROS: A ROS was performed and pertinent positives and negatives are included in the history.  GENERAL: No fevers or chills. HEENT: No change in vision, no earache, sore throat or sinus congestion. NECK: No pain or stiffness. CARDIOVASCULAR: No chest pain or pressure. No palpitations. PULMONARY: No shortness of breath, cough or wheeze. GASTROINTESTINAL: No abdominal pain, nausea, vomiting or diarrhea, melena or bright red blood per rectum. GENITOURINARY: No urinary frequency, urgency, hesitancy or dysuria. MUSCULOSKELETAL: No joint or muscle pain, no back pain, no recent trauma. DERMATOLOGIC: No rash, no itching, no lesions. ENDOCRINE: No polyuria, polydipsia, no heat or cold intolerance. No recent change in weight. HEMATOLOGICAL: No anemia or easy bruising or bleeding. NEUROLOGIC: No headache, seizures, numbness, tingling or weakness. PSYCHIATRIC: No depression, no loss of interest in normal activity or change in  sleep pattern.     Exam:   BP 126/82 (BP Location: Right Arm, Patient Position: Sitting, Cuff Size: Normal)   Ht $R'5\' 2"'IB$  (1.575 m)   Wt 161 lb (73 kg)   BMI 29.45 kg/m   Body mass index is 29.45 kg/m.  General appearance : Well developed well nourished female. No acute distress HEENT: Eyes: no retinal hemorrhage or exudates,  Neck supple, trachea midline, no carotid bruits, no thyroidmegaly Lungs: Clear to auscultation, no rhonchi or wheezes, or rib retractions  Heart: Regular rate and rhythm, no murmurs or gallops Breast:Examined in sitting and supine position were symmetrical in appearance, no palpable masses or tenderness,  no skin retraction, no nipple inversion, no nipple discharge, no skin discoloration, no axillary or supraclavicular lymphadenopathy Abdomen: no palpable masses or tenderness, no rebound or guarding Extremities: no edema or skin discoloration or tenderness  Pelvic: Vulva: Normal             Vagina: No gross lesions or discharge  Cervix: No gross lesions or discharge.  Pap reflex done.  Uterus  AV, normal size, shape and consistency, non-tender and mobile  Adnexa  Without masses or tenderness  Anus: normal.  Small condyloma superior to anus in midline.   Assessment/Plan:  55 y.o. female for annual exam   1. Encounter for routine gynecological examination with Papanicolaou smear of cervix Normal gynecologic exam in menopause.  Pap reflex done.  Breast exam status post bilateral reconstruction and tattoos post right breast cancer.  Colonoscopy 2017.  Health  labs with family physician.  Stable body mass index at 29.45.  Continue with healthy nutrition and fitness. - Cytology - PAP( [facility])  2. S/P BSO (bilateral salpingo-oophorectomy)  3. Postmenopausal No postmenopausal bleeding.  4. Postmenopausal atrophic vaginitis Well on Vagifem 1 tablet vaginally weekly.  Prescription sent to pharmacy.  Also using coconut oil.  5. Condyloma acuminatum in  female Reduced size of a condyloma superior to the anus.  Will treat again with Aldara.  Prescription sent to pharmacy.  6. H/O herpes genitalis No recent recurrence on Valtrex prophylaxis.  No contraindication to continue.  Prescription sent to pharmacy.  7. Infiltrating ductal carcinoma of right breast, estrogen receptor positive, stage 1 (HCC) Status post bilateral mastectomy with reconstruction and tattoos.  Finished with tamoxifen.  Other orders - Estradiol 10 MCG TABS vaginal tablet; Place 1 tablet (10 mcg total) vaginally once a week. - valACYclovir (VALTREX) 500 MG tablet; Take 1 tablet (500 mg total) by mouth daily. - imiquimod (ALDARA) 5 % cream; Apply to affected area overnight  three times weekly as needed  Princess Bruins MD, 11:38 AM 09/04/2020

## 2020-09-10 ENCOUNTER — Ambulatory Visit (INDEPENDENT_AMBULATORY_CARE_PROVIDER_SITE_OTHER): Payer: Federal, State, Local not specified - PPO | Admitting: Internal Medicine

## 2020-09-10 ENCOUNTER — Other Ambulatory Visit: Payer: Self-pay

## 2020-09-10 ENCOUNTER — Encounter: Payer: Self-pay | Admitting: Internal Medicine

## 2020-09-10 VITALS — BP 134/82 | HR 75 | Temp 98.5°F | Ht 61.8 in | Wt 159.6 lb

## 2020-09-10 DIAGNOSIS — R0982 Postnasal drip: Secondary | ICD-10-CM | POA: Diagnosis not present

## 2020-09-10 DIAGNOSIS — E663 Overweight: Secondary | ICD-10-CM | POA: Diagnosis not present

## 2020-09-10 DIAGNOSIS — Z0000 Encounter for general adult medical examination without abnormal findings: Secondary | ICD-10-CM | POA: Diagnosis not present

## 2020-09-10 DIAGNOSIS — Z79899 Other long term (current) drug therapy: Secondary | ICD-10-CM | POA: Diagnosis not present

## 2020-09-10 DIAGNOSIS — E785 Hyperlipidemia, unspecified: Secondary | ICD-10-CM | POA: Diagnosis not present

## 2020-09-10 DIAGNOSIS — Z6829 Body mass index (BMI) 29.0-29.9, adult: Secondary | ICD-10-CM

## 2020-09-10 DIAGNOSIS — I10 Essential (primary) hypertension: Secondary | ICD-10-CM | POA: Diagnosis not present

## 2020-09-10 DIAGNOSIS — R0683 Snoring: Secondary | ICD-10-CM

## 2020-09-10 DIAGNOSIS — R7309 Other abnormal glucose: Secondary | ICD-10-CM | POA: Diagnosis not present

## 2020-09-10 DIAGNOSIS — Z23 Encounter for immunization: Secondary | ICD-10-CM

## 2020-09-10 LAB — CMP14+EGFR
ALT: 25 IU/L (ref 0–32)
AST: 19 IU/L (ref 0–40)
Albumin/Globulin Ratio: 2 (ref 1.2–2.2)
Albumin: 4.9 g/dL (ref 3.8–4.9)
Alkaline Phosphatase: 88 IU/L (ref 44–121)
BUN/Creatinine Ratio: 16 (ref 9–23)
BUN: 12 mg/dL (ref 6–24)
Bilirubin Total: 0.9 mg/dL (ref 0.0–1.2)
CO2: 26 mmol/L (ref 20–29)
Calcium: 10.4 mg/dL — ABNORMAL HIGH (ref 8.7–10.2)
Chloride: 103 mmol/L (ref 96–106)
Creatinine, Ser: 0.77 mg/dL (ref 0.57–1.00)
Globulin, Total: 2.5 g/dL (ref 1.5–4.5)
Glucose: 76 mg/dL (ref 65–99)
Potassium: 4.3 mmol/L (ref 3.5–5.2)
Sodium: 144 mmol/L (ref 134–144)
Total Protein: 7.4 g/dL (ref 6.0–8.5)
eGFR: 92 mL/min/{1.73_m2} (ref >59)

## 2020-09-10 LAB — POCT URINALYSIS DIPSTICK
Bilirubin, UA: NEGATIVE
Glucose, UA: NEGATIVE
Ketones, UA: NEGATIVE
Leukocytes, UA: NEGATIVE
Nitrite, UA: NEGATIVE
Protein, UA: NEGATIVE
Spec Grav, UA: >=1.03 — AB (ref 1.010–1.025)
Urobilinogen, UA: 0.2 E.U./dL
pH, UA: 5.5 (ref 5.0–8.0)

## 2020-09-10 LAB — HEMOGLOBIN A1C
Est. average glucose Bld gHb Est-mCnc: 114 mg/dL
Hgb A1c MFr Bld: 5.6 % (ref 4.8–5.6)

## 2020-09-10 LAB — CYTOLOGY - PAP: Diagnosis: NEGATIVE

## 2020-09-10 LAB — POCT UA - MICROALBUMIN
Albumin/Creatinine Ratio, Urine, POC: 30
Creatinine, POC: 300 mg/dL
Microalbumin Ur, POC: 10 mg/L

## 2020-09-10 LAB — CBC
Hematocrit: 43.7 % (ref 34.0–46.6)
Hemoglobin: 14.7 g/dL (ref 11.1–15.9)
MCH: 30.1 pg (ref 26.6–33.0)
MCHC: 33.6 g/dL (ref 31.5–35.7)
MCV: 89 fL (ref 79–97)
Platelets: 270 10*3/uL (ref 150–450)
RBC: 4.89 x10E6/uL (ref 3.77–5.28)
RDW: 12.8 % (ref 11.7–15.4)
WBC: 6.8 10*3/uL (ref 3.4–10.8)

## 2020-09-10 LAB — LIPID PANEL
Chol/HDL Ratio: 2.6 ratio (ref 0.0–4.4)
Cholesterol, Total: 191 mg/dL (ref 100–199)
HDL: 73 mg/dL (ref >39)
LDL Chol Calc (NIH): 96 mg/dL (ref 0–99)
Triglycerides: 125 mg/dL (ref 0–149)
VLDL Cholesterol Cal: 22 mg/dL (ref 5–40)

## 2020-09-10 MED ORDER — MONTELUKAST SODIUM 10 MG PO TABS: 10.0000 mg | ORAL_TABLET | Freq: Every day | ORAL | 2 refills | Status: DC

## 2020-09-10 MED ORDER — SHINGRIX 50 MCG/0.5ML IM SUSR: 0.5000 mL | Freq: Once | INTRAMUSCULAR | 0 refills | Status: AC

## 2020-09-10 NOTE — Patient Instructions (Signed)
Health Maintenance, Female Adopting a healthy lifestyle and getting preventive care are important in promoting health and wellness. Ask your health care provider about:  The right schedule for you to have regular tests and exams.  Things you can do on your own to prevent diseases and keep yourself healthy. What should I know about diet, weight, and exercise? Eat a healthy diet  Eat a diet that includes plenty of vegetables, fruits, low-fat dairy products, and lean protein.  Do not eat a lot of foods that are high in solid fats, added sugars, or sodium.   Maintain a healthy weight Body mass index (BMI) is used to identify weight problems. It estimates body fat based on height and weight. Your health care provider can help determine your BMI and help you achieve or maintain a healthy weight. Get regular exercise Get regular exercise. This is one of the most important things you can do for your health. Most adults should:  Exercise for at least 150 minutes each week. The exercise should increase your heart rate and make you sweat (moderate-intensity exercise).  Do strengthening exercises at least twice a week. This is in addition to the moderate-intensity exercise.  Spend less time sitting. Even light physical activity can be beneficial. Watch cholesterol and blood lipids Have your blood tested for lipids and cholesterol at 54 years of age, then have this test every 5 years. Have your cholesterol levels checked more often if:  Your lipid or cholesterol levels are high.  You are older than 54 years of age.  You are at high risk for heart disease. What should I know about cancer screening? Depending on your health history and family history, you may need to have cancer screening at various ages. This may include screening for:  Breast cancer.  Cervical cancer.  Colorectal cancer.  Skin cancer.  Lung cancer. What should I know about heart disease, diabetes, and high blood  pressure? Blood pressure and heart disease  High blood pressure causes heart disease and increases the risk of stroke. This is more likely to develop in people who have high blood pressure readings, are of African descent, or are overweight.  Have your blood pressure checked: ? Every 3-5 years if you are 18-39 years of age. ? Every year if you are 40 years old or older. Diabetes Have regular diabetes screenings. This checks your fasting blood sugar level. Have the screening done:  Once every three years after age 40 if you are at a normal weight and have a low risk for diabetes.  More often and at a younger age if you are overweight or have a high risk for diabetes. What should I know about preventing infection? Hepatitis B If you have a higher risk for hepatitis B, you should be screened for this virus. Talk with your health care provider to find out if you are at risk for hepatitis B infection. Hepatitis C Testing is recommended for:  Everyone born from 1945 through 1965.  Anyone with known risk factors for hepatitis C. Sexually transmitted infections (STIs)  Get screened for STIs, including gonorrhea and chlamydia, if: ? You are sexually active and are younger than 54 years of age. ? You are older than 54 years of age and your health care provider tells you that you are at risk for this type of infection. ? Your sexual activity has changed since you were last screened, and you are at increased risk for chlamydia or gonorrhea. Ask your health care provider   if you are at risk.  Ask your health care provider about whether you are at high risk for HIV. Your health care provider may recommend a prescription medicine to help prevent HIV infection. If you choose to take medicine to prevent HIV, you should first get tested for HIV. You should then be tested every 3 months for as long as you are taking the medicine. Pregnancy  If you are about to stop having your period (premenopausal) and  you may become pregnant, seek counseling before you get pregnant.  Take 400 to 800 micrograms (mcg) of folic acid every day if you become pregnant.  Ask for birth control (contraception) if you want to prevent pregnancy. Osteoporosis and menopause Osteoporosis is a disease in which the bones lose minerals and strength with aging. This can result in bone fractures. If you are 65 years old or older, or if you are at risk for osteoporosis and fractures, ask your health care provider if you should:  Be screened for bone loss.  Take a calcium or vitamin D supplement to lower your risk of fractures.  Be given hormone replacement therapy (HRT) to treat symptoms of menopause. Follow these instructions at home: Lifestyle  Do not use any products that contain nicotine or tobacco, such as cigarettes, e-cigarettes, and chewing tobacco. If you need help quitting, ask your health care provider.  Do not use street drugs.  Do not share needles.  Ask your health care provider for help if you need support or information about quitting drugs. Alcohol use  Do not drink alcohol if: ? Your health care provider tells you not to drink. ? You are pregnant, may be pregnant, or are planning to become pregnant.  If you drink alcohol: ? Limit how much you use to 0-1 drink a day. ? Limit intake if you are breastfeeding.  Be aware of how much alcohol is in your drink. In the [place] one drink equals one 12 oz bottle of beer (355 mL), one 5 oz glass of wine (148 mL), or one 1 oz glass of hard liquor (44 mL). General instructions  Schedule regular health, dental, and eye exams.  Stay current with your vaccines.  Tell your health care provider if: ? You often feel depressed. ? You have ever been abused or do not feel safe at home. Summary  Adopting a healthy lifestyle and getting preventive care are important in promoting health and wellness.  Follow your health care provider's instructions about healthy  diet, exercising, and getting tested or screened for diseases.  Follow your health care provider's instructions on monitoring your cholesterol and blood pressure. This information is not intended to replace advice given to you by your health care provider. Make sure you discuss any questions you have with your health care provider. Document Revised: 03/22/2018 Document Reviewed: 03/22/2018 Elsevier Patient Education  2021 Elsevier Inc.  

## 2020-09-10 NOTE — Progress Notes (Signed)
I,Katawbba Wiggins,acting as a Education administrator for Maximino Greenland, MD.,have documented all relevant documentation on the behalf of Maximino Greenland, MD,as directed by  Maximino Greenland, MD while in the presence of Maximino Greenland, MD.  This visit occurred during the SARS-CoV-2 public health emergency.  Safety protocols were in place, including screening questions prior to the visit, additional usage of staff PPE, and extensive cleaning of exam room while observing appropriate contact time as indicated for disinfecting solutions.  Subjective:     Patient ID: Leslie Salazar , female    DOB: Sep 08, 1965 , 55 y.o.   MRN: [id number]   Chief Complaint  Patient presents with  . Annual Exam  . Hypertension    HPI  She is here today for a full physical exam. She is followed by Chalmers Cater, MD GYN for her pelvic exams, last pap was 09/04/2020.  Hypertension This is a chronic problem. The current episode started more than 1 year ago. The problem has been rapidly improving since onset. The problem is controlled. Pertinent negatives include no blurred vision, chest pain, headaches, neck pain, orthopnea or palpitations. Risk factors for coronary artery disease include post-menopausal state. The current treatment provides moderate improvement. There are no compliance problems.      Past Medical History:  Diagnosis Date  . Acne   . Cancer ([place]) 2012   RIGHT DUCTAL CARCINOMA INSITU..  . Complication of anesthesia   . Condyloma    ON BUTTOCKS  . 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 Fibroids   . Heart palpitations   . High cholesterol   . History of angina   . HSV-1 (herpes simplex virus 1) infection   . Hx of radiation therapy 2013   from april to may - six weeks  . Hypertension   . Left ovarian cyst   . PONV (postoperative nausea and vomiting)    nausea no vomiting  . Scoliosis   . Urinary urgency   . Wears glasses      Family History  Problem Relation Age  of Onset  . Colon cancer Paternal Aunt   . Diabetes Mother        HAS HAD A KIDNEY TRANSPLATN  . Hypertension Mother   . Kidney failure Mother   . Hypertension Father   . Gallstones Father   . Gallstones Sister      Current Outpatient Medications:  .  acetaminophen (TYLENOL) 500 MG tablet, Take 500 mg by mouth every 6 (six) hours as needed., Disp: , Rfl:  .  aspirin EC 81 MG tablet, Take 81 mg by mouth every morning., Disp: , Rfl:  .  atorvastatin (LIPITOR) 20 MG tablet, Take 1 tablet (20 mg total) by mouth daily., Disp: 90 tablet, Rfl: 1 .  Cholecalciferol 25 MCG (1000 UT) tablet, Take 1 tablet by mouth daily. 2000u, Disp: , Rfl:  .  Coenzyme Q10 100 MG capsule, Take 1 capsule by mouth daily., Disp: , Rfl:  .  Estradiol 10 MCG TABS vaginal tablet, Place 1 tablet (10 mcg total) vaginally once a week., Disp: 12 tablet, Rfl: 4 .  hydrochlorothiazide (MICROZIDE) 12.5 MG capsule, Take 1 capsule (12.5 mg total) by mouth daily., Disp: 90 capsule, Rfl: 1 .  hydrocortisone (ANUSOL-HC) 2.5 % rectal cream, Place 1 application rectally 2 (two) times daily. (Patient taking differently: Place 1 application rectally as needed.), Disp: 30 g, Rfl: 3 .  ibuprofen (ADVIL) 600 MG  tablet, Take 600 mg by mouth every 6 (six) hours as needed., Disp: , Rfl:  .  imiquimod (ALDARA) 5 % cream, Apply to affected area overnight  three times weekly as needed, Disp: 12 each, Rfl: 1 .  loratadine (CLARITIN) 10 MG tablet, Take 10 mg by mouth every morning. , Disp: , Rfl:  .  metoprolol succinate (TOPROL-XL) 25 MG 24 hr tablet, TAKE 1 TABLET(25 MG) BY MOUTH DAILY, Disp: 90 tablet, Rfl: 1 .  montelukast (SINGULAIR) 10 MG tablet, Take 1 tablet (10 mg total) by mouth daily., Disp: 30 tablet, Rfl: 2 .  Multiple Vitamins-Minerals (MULTIVITAMIN WOMENS 50+ ADV PO), Take by mouth., Disp: , Rfl:  .  Omega-3 Fatty Acids (FISH OIL PO), Take by mouth. 1 capsule daily, Disp: , Rfl:  .  valACYclovir (VALTREX) 500 MG tablet, Take 1  tablet (500 mg total) by mouth daily., Disp: 90 tablet, Rfl: 4   No Known Allergies    The patient states she uses post menopausal status for birth control. Last LMP was No LMP recorded. Patient is postmenopausal.. Negative for Dysmenorrhea. Negative for: breast discharge, breast lump(s), breast pain and breast self exam. Associated symptoms include abnormal vaginal bleeding. Pertinent negatives include abnormal bleeding (hematology), anxiety, decreased libido, depression, difficulty falling sleep, dyspareunia, history of infertility, nocturia, sexual dysfunction, sleep disturbances, urinary incontinence, urinary urgency, vaginal discharge and vaginal itching. Diet regular.The patient states her exercise level is  moderate.  . The patient's tobacco use is:  Social History   Tobacco Use  Smoking Status Never Smoker  Smokeless Tobacco Never Used  . She has been exposed to passive smoke. The patient's alcohol use is:  Social History   Substance and Sexual Activity  Alcohol Use Yes  . Alcohol/week: 1.0 standard drink  . Types: 1 Glasses of wine per week   Review of Systems  Constitutional: Positive for fatigue.  HENT: Positive for postnasal drip.   Eyes: Negative.  Negative for blurred vision.  Respiratory: Negative.   Cardiovascular: Negative for chest pain, palpitations and orthopnea.  Gastrointestinal: Negative.   Endocrine: Negative.   Genitourinary: Negative.   Musculoskeletal: Negative.  Negative for neck pain.  Skin: Negative.   Allergic/Immunologic: Negative.   Neurological: Negative.  Negative for headaches.  Hematological: Negative.   Psychiatric/Behavioral: Negative.      Today's Vitals   09/10/20 1029  BP: 134/82  Pulse: 75  Temp: 98.5 F (36.9 C)  TempSrc: Oral  Weight: 159 lb 9.6 oz (72.4 kg)  Height: 5' 1.8" (1.57 m)   Body mass index is 29.38 kg/m.  Wt Readings from Last 3 Encounters:  09/10/20 159 lb 9.6 oz (72.4 kg)  09/04/20 161 lb (73 kg)  06/27/20  158 lb (71.7 kg)   BP Readings from Last 3 Encounters:  09/10/20 134/82  09/04/20 126/82  06/27/20 128/83   Objective:  Physical Exam Vitals and nursing note reviewed.  Constitutional:      Appearance: Normal appearance.  HENT:     Head: Normocephalic and atraumatic.     Right Ear: Tympanic membrane, ear canal and external ear normal.     Left Ear: Tympanic membrane, ear canal and external ear normal.     Nose: Nose normal.     Mouth/Throat:     Mouth: Mucous membranes are moist.     Pharynx: Oropharynx is clear.  Eyes:     Extraocular Movements: Extraocular movements intact.     Conjunctiva/sclera: Conjunctivae normal.     Pupils: Pupils are equal,  round, and reactive to light.  Cardiovascular:     Rate and Rhythm: Normal rate and regular rhythm.     Pulses: Normal pulses.     Heart sounds: Normal heart sounds.  Pulmonary:     Effort: Pulmonary effort is normal.     Breath sounds: Normal breath sounds.  Chest:  Breasts:     Right: Absent.     Left: Absent.      Comments: B/l mastectomy Anterior chest tattoo Abdominal:     General: Abdomen is flat. Bowel sounds are normal.     Palpations: Abdomen is soft.  Genitourinary:    Comments: deferred Musculoskeletal:        General: Normal range of motion.     Cervical back: Normal range of motion and neck supple.  Skin:    General: Skin is warm and dry.  Neurological:     General: No focal deficit present.     Mental Status: She is alert and oriented to person, place, and time.  Psychiatric:        Mood and Affect: Mood normal.        Behavior: Behavior normal.         Assessment And Plan:     1. Routine general medical examination at health care facility Comments: A full exam was performed.  PATIENT IS ADVISED TO GET 30-45 MINUTES REGULAR EXERCISE NO LESS THAN FOUR TO FIVE DAYS PER WEEK - BOTH WEIGHTBEARING EXERCISES AND AEROBIC ARE RECOMMENDED.  PATIENT IS ADVISED TO FOLLOW A HEALTHY DIET WITH AT LEAST SIX  FRUITS/VEGGIES PER DAY, DECREASE INTAKE OF RED MEAT, AND TO INCREASE FISH INTAKE TO TWO DAYS PER WEEK.  MEATS/FISH SHOULD NOT BE FRIED, BAKED OR BROILED IS PREFERABLE.  IT IS ALSO IMPORTANT TO CUT BACK ON YOUR SUGAR INTAKE. PLEASE AVOID ANYTHING WITH ADDED SUGAR, CORN SYRUP OR OTHER SWEETENERS. IF YOU MUST USE A SWEETENER, YOU CAN TRY STEVIA. IT IS ALSO IMPORTANT TO AVOID ARTIFICIALLY SWEETENERS AND DIET BEVERAGES. LASTLY, I SUGGEST WEARING SPF 50 SUNSCREEN ON EXPOSED PARTS AND ESPECIALLY WHEN IN THE DIRECT SUNLIGHT FOR AN EXTENDED PERIOD OF TIME.  PLEASE AVOID FAST FOOD RESTAURANTS AND INCREASE YOUR WATER INTAKE.  - Hemoglobin A1c - CBC - CMP14+EGFR - Lipid panel  2. Essential hypertension, benign Comments: Chronic, controlled. EKG performed, NSR w/ LAE, LVH. Advised to follow low sodium diet. She will f/u in six months for re-evaluation.  - POCT Urinalysis Dipstick (81002) - POCT UA - Microalbumin - EKG 12-Lead  3. Postnasal drip Comments: She is currently taking loratadine. I will send rx montelukast 37m daily.   4. Immunization due Comments: I will send rx Shingrix to her local pharmacy.   5. Snoring Comments: Also c/o nocturia, early am headaches, daytime somnolence. and non-restorative sleep.  - Ambulatory referral to Neurology  6. Overweight with body mass index (BMI) of 29 to 29.9 in adult She is encouraged to aim for at least 150 minutes of exercise per week.   Patient was given opportunity to ask questions. Patient verbalized understanding of the plan and was able to repeat key elements of the plan. All questions were answered to their satisfaction.   I, RMaximino Greenland MD, have reviewed all documentation for this visit. The documentation on 09/10/20 for the exam, diagnosis, procedures, and orders are all accurate and complete.  THE PATIENT IS ENCOURAGED TO PRACTICE SOCIAL DISTANCING DUE TO THE COVID-19 PANDEMIC.

## 2020-09-11 ENCOUNTER — Encounter: Payer: Self-pay | Admitting: Internal Medicine

## 2020-10-19 ENCOUNTER — Other Ambulatory Visit: Payer: Self-pay | Admitting: Internal Medicine

## 2020-10-21 ENCOUNTER — Encounter: Payer: Self-pay | Admitting: Internal Medicine

## 2020-10-22 ENCOUNTER — Other Ambulatory Visit: Payer: Self-pay

## 2020-10-22 MED ORDER — ATORVASTATIN CALCIUM 20 MG PO TABS: 20.0000 mg | ORAL_TABLET | Freq: Every day | ORAL | 1 refills | Status: DC

## 2020-10-22 MED ORDER — HYDROCHLOROTHIAZIDE 12.5 MG PO CAPS: 12.5000 mg | ORAL_CAPSULE | Freq: Every day | ORAL | 1 refills | Status: DC

## 2020-11-27 ENCOUNTER — Encounter: Payer: Self-pay | Admitting: Neurology

## 2020-11-27 ENCOUNTER — Ambulatory Visit (INDEPENDENT_AMBULATORY_CARE_PROVIDER_SITE_OTHER): Payer: Federal, State, Local not specified - PPO | Admitting: Neurology

## 2020-11-27 ENCOUNTER — Encounter: Payer: Self-pay | Admitting: *Deleted

## 2020-11-27 VITALS — BP 130/86 | HR 59 | Ht 60.0 in | Wt 161.0 lb

## 2020-11-27 DIAGNOSIS — R200 Anesthesia of skin: Secondary | ICD-10-CM | POA: Insufficient documentation

## 2020-11-27 DIAGNOSIS — R0683 Snoring: Secondary | ICD-10-CM

## 2020-11-27 DIAGNOSIS — G4719 Other hypersomnia: Secondary | ICD-10-CM

## 2020-11-27 DIAGNOSIS — N951 Menopausal and female climacteric states: Secondary | ICD-10-CM | POA: Diagnosis not present

## 2020-11-27 DIAGNOSIS — R519 Headache, unspecified: Secondary | ICD-10-CM

## 2020-11-27 DIAGNOSIS — R202 Paresthesia of skin: Secondary | ICD-10-CM

## 2020-11-27 MED ORDER — TRAZODONE HCL 50 MG PO TABS: 50.0000 mg | ORAL_TABLET | Freq: Every evening | ORAL | 3 refills | Status: DC | PRN

## 2020-11-27 NOTE — Patient Instructions (Addendum)
Insomnia Insomnia is a sleep disorder that makes it difficult to fall asleep or stay asleep. Insomnia can cause fatigue, low energy, difficulty concentrating, moodswings, and poor performance at work or school. There are three different ways to classify insomnia: Difficulty falling asleep. Difficulty staying asleep. Waking up too early in the morning. Any type of insomnia can be long-term (chronic) or short-term (acute). Both are common. Short-term insomnia usually lasts for three months or less. Chronic insomnia occurs at least three times a week for longer than threemonths. What are the causes? Insomnia may be caused by another condition, situation, or substance, such as: Anxiety. Certain medicines. Gastroesophageal reflux disease (GERD) or other gastrointestinal conditions. Asthma or other breathing conditions. Restless legs syndrome, sleep apnea, or other sleep disorders. Chronic pain. Menopause. Stroke. Abuse of alcohol, tobacco, or illegal drugs. Mental health conditions, such as depression. Caffeine. Neurological disorders, such as Alzheimer's disease. An overactive thyroid (hyperthyroidism). Sometimes, the cause of insomnia may not be known. What increases the risk? Risk factors for insomnia include: Gender. Women are affected more often than men. Age. Insomnia is more common as you get older. Stress. Lack of exercise. Irregular work schedule or working night shifts. Traveling between different time zones. Certain medical and mental health conditions. What are the signs or symptoms? If you have insomnia, the main symptom is having trouble falling asleep or having trouble staying asleep. This may lead to other symptoms, such as: Feeling fatigued or having low energy. Feeling nervous about going to sleep. Not feeling rested in the morning. Having trouble concentrating. Feeling irritable, anxious, or depressed. How is this diagnosed? This condition may be diagnosed based  on: Your symptoms and medical history. Your health care provider may ask about: Your sleep habits. Any medical conditions you have. Your mental health. A physical exam. How is this treated? Treatment for insomnia depends on the cause. Treatment may focus on treating an underlying condition that is causing insomnia. Treatment may also include: Medicines to help you sleep. Counseling or therapy. Lifestyle adjustments to help you sleep better. Follow these instructions at home: Eating and drinking  Limit or avoid alcohol, caffeinated beverages, and cigarettes, especially close to bedtime. These can disrupt your sleep. Do not eat a large meal or eat spicy foods right before bedtime. This can lead to digestive discomfort that can make it hard for you to sleep.  Sleep habits  Keep a sleep diary to help you and your health care provider figure out what could be causing your insomnia. Write down: When you sleep. When you wake up during the night. How well you sleep. How rested you feel the next day. Any side effects of medicines you are taking. What you eat and drink. Make your bedroom a dark, comfortable place where it is easy to fall asleep. Put up shades or blackout curtains to block light from outside. Use a white noise machine to block noise. Keep the temperature cool. Limit screen use before bedtime. This includes: Watching TV. Using your smartphone, tablet, or computer. Stick to a routine that includes going to bed and waking up at the same times every day and night. This can help you fall asleep faster. Consider making a quiet activity, such as reading, part of your nighttime routine. Try to avoid taking naps during the day so that you sleep better at night. Get out of bed if you are still awake after 15 minutes of trying to sleep. Keep the lights down, but try reading or doing a quiet   activity. When you feel sleepy, go back to bed.  General instructions Take over-the-counter  and prescription medicines only as told by your health care provider. Exercise regularly, as told by your health care provider. Avoid exercise starting several hours before bedtime. Use relaxation techniques to manage stress. Ask your health care provider to suggest some techniques that may work well for you. These may include: Breathing exercises. Routines to release muscle tension. Visualizing peaceful scenes. Make sure that you drive carefully. Avoid driving if you feel very sleepy. Keep all follow-up visits as told by your health care provider. This is important. Contact a health care provider if: You are tired throughout the day. You have trouble in your daily routine due to sleepiness. You continue to have sleep problems, or your sleep problems get worse. Get help right away if: You have serious thoughts about hurting yourself or someone else. If you ever feel like you may hurt yourself or others, or have thoughts about taking your own life, get help right away. You can go to your nearest emergency department or call: Your local emergency services (911 in the [place] A suicide crisis helpline, such as the [facility] at 845-431-9879. This is open 24 hours a day. Summary Insomnia is a sleep disorder that makes it difficult to fall asleep or stay asleep. Insomnia can be long-term (chronic) or short-term (acute). Treatment for insomnia depends on the cause. Treatment may focus on treating an underlying condition that is causing insomnia. Keep a sleep diary to help you and your health care provider figure out what could be causing your insomnia. This information is not intended to replace advice given to you by your health care provider. Make sure you discuss any questions you have with your healthcare provider. Document Revised: 02/07/2020 Document Reviewed: 02/07/2020 Elsevier Patient Education  2022 [place]. Trazodone Tablets What is this  medication? TRAZODONE (TRAZ oh done) treats depression. It increases the amount ofserotonin in the brain, a hormone that helps regulate mood. This medicine may be used for other purposes; ask your health care provider orpharmacist if you have questions. COMMON BRAND NAME(S): Desyrel What should I tell my care team before I take this medication? They need to know if you have any of these conditions: Attempted suicide or thinking about it Bipolar disorder Bleeding problems Glaucoma Heart disease, or previous heart attack Irregular heart beat Kidney or liver disease Low levels of sodium in the blood An unusual or allergic reaction to trazodone, other medications, foods, dyes or preservatives Pregnant or trying to get pregnant Breast-feeding How should I use this medication? Take this medication by mouth with a glass of water. Follow the directions on the prescription label. Take this medication shortly after a meal or a light snack. Take your medication at regular intervals. Do not take your medication more often than directed. Do not stop taking this medication suddenly except upon the advice of your care team. Stopping this medication too quickly maycause serious side effects or your condition may worsen. A special MedGuide will be given to you by the pharmacist with eachprescription and refill. Be sure to read this information carefully each time. Talk to your care team regarding the use of this medication in children.Special care may be needed. Overdosage: If you think you have taken too much of this medicine contact apoison control center or emergency room at once. NOTE: This medicine is only for you. Do not share this medicine with others. What if I miss a dose?  If you miss a dose, take it as soon as you can. If it is almost time for yournext dose, take only that dose. Do not take double or extra doses. What may interact with this medication? Do not take this medication with any of the  following: Certain medications for fungal infections like fluconazole, itraconazole, ketoconazole, posaconazole, voriconazole Cisapride Dronedarone Linezolid MAOIs like Carbex, Eldepryl, Marplan, Nardil, and Parnate Mesoridazine Methylene blue (injected into a vein) Pimozide Saquinavir Thioridazine This medication may also interact with the following: Alcohol Antiviral medications for HIV or AIDS Aspirin and aspirin-like medications Barbiturates like phenobarbital Certain medications for blood pressure, heart disease, irregular heart beat Certain medications for depression, anxiety, or psychotic disturbances Certain medications for migraine headache like almotriptan, eletriptan, frovatriptan, naratriptan, rizatriptan, sumatriptan, zolmitriptan Certain medications for seizures like carbamazepine and phenytoin Certain medications for sleep Certain medications that treat or prevent blood clots like dalteparin, enoxaparin, warfarin Digoxin Fentanyl Lithium NSAIDS, medications for pain and inflammation, like ibuprofen or naproxen Other medications that prolong the QT interval (cause an abnormal heart rhythm) like dofetilide Rasagiline Supplements like St. John's wort, kava kava, valerian Tramadol Tryptophan This list may not describe all possible interactions. Give your health care provider a list of all the medicines, herbs, non-prescription drugs, or dietary supplements you use. Also tell them if you smoke, drink alcohol, or use illegaldrugs. Some items may interact with your medicine. What should I watch for while using this medication? Tell your care team if your symptoms do not get better or if they get worse. Visit your care team for regular checks on your progress. Because it may take several weeks to see the full effects of this medication, it is important tocontinue your treatment as prescribed by your care team. Watch for new or worsening thoughts of suicide or depression. This  includes sudden changes in mood, behaviors, or thoughts. These changes can happen at any time but are more common in the beginning of treatment or after a change in dose. Call your care team right away if you experience these thoughts orworsening depression. Manic episodes may happen in patients with bipolar disorder who take this medication. Watch for changes in feelings or behaviors such as feeling anxious, nervous, agitated, panicky, irritable, hostile, aggressive, impulsive, severely restless, overly excited and hyperactive, or trouble sleeping. These changes can happen at any time but are more common in the beginning of treatment or after a change in dose. Call your care team right away if you notice any ofthese symptoms. You may get drowsy or dizzy. Do not drive, use machinery, or do anything that needs mental alertness until you know how this medication affects you. Do not stand or sit up quickly, especially if you are an older patient. This reduces the risk of dizzy or fainting spells. Alcohol may interfere with the effect ofthis medication. Avoid alcoholic drinks. This medication may cause dry eyes and blurred vision. If you wear contact lenses you may feel some discomfort. Lubricating drops may help. See your eyedoctor if the problem does not go away or is severe. Your mouth may get dry. Chewing sugarless gum, sucking hard candy and drinking plenty of water may help. Contact your care team if the problem does not goaway or is severe. What side effects may I notice from receiving this medication? Side effects that you should report to your care team as soon as possible: Allergic reactions-skin rash, itching, hives, swelling of the face, lips, tongue, or throat Bleeding-bloody or black, tar-like stools, red  or dark brown urine, vomiting blood or brown material that looks like coffee grounds, small, red or purple spots on skin, unusual bleeding or bruising Heart rhythm changes-fast or irregular  heartbeat, dizziness, feeling faint or lightheaded, chest pain, trouble breathing Low blood pressure-dizziness, feeling faint or lightheaded, blurry vision Low sodium level-muscle weakness, fatigue, dizziness, headache, confusion Prolonged or painful erection Serotonin syndrome-irritability, confusion, fast or irregular heartbeat, muscle stiffness, twitching muscles, sweating, high fever, seizures, chills, vomiting, diarrhea Sudden eye pain or change in vision such as blurry vision, seeing halos around lights, vision loss Thoughts of suicide or self-harm, worsening mood, feelings of depression Side effects that usually do not require medical attention (report to your careteam if they continue or are bothersome): Change in sex drive or performance Constipation Dizziness Drowsiness Dry mouth This list may not describe all possible side effects. Call your doctor for medical advice about side effects. You may report side effects to FDA at1-800-FDA-1088. Where should I keep my medication? Keep out of the reach of children and pets. Store at room temperature between 15 and 30 degrees C (59 to 86 degrees F). Protect from light. Keep container tightly closed. Throw away any unusedmedication after the expiration date. NOTE: This sheet is a summary. It may not cover all possible information. If you have questions about this medicine, talk to your doctor, pharmacist, orhealth care provider.  2022 Elsevier/Gold Standard (2020-02-18 14:46:11) Quality Sleep Information, Adult Quality sleep is important for your mental and physical health. It also improves your quality of life. Quality sleep means you: Are asleep for most of the time you are in bed. Fall asleep within 30 minutes. Wake up no more than once a night.  Are awake for no longer than 20 minutes if you do wake up during the night. Most adults need 7-8 hours of quality sleep each night. How can poor sleep affect me? If you do not get enough  quality sleep, you may have: Mood swings. Daytime sleepiness. Confusion. Decreased reaction time. Sleep disorders, such as insomnia and sleep apnea. Difficulty with: Solving problems. Coping with stress. Paying attention. These issues may affect your performance and productivity at work, school, and at home. Lack of sleep may also put you at higher risk for accidents, suicide,and risky behaviors. If you do not get quality sleep you may also be at higher risk for several health problems, including: Infections. Type 2 diabetes. Heart disease. High blood pressure. Obesity. Worsening of long-term conditions, like arthritis, kidney disease, depression, Parkinson's disease, and epilepsy. What actions can I take to get more quality sleep?     Stick to a sleep schedule. Go to sleep and wake up at about the same time each day. Do not try to sleep less on weekdays and make up for lost sleep on weekends. This does not work. Try to get about 30 minutes of exercise on most days. Do not exercise 2-3 hours before going to bed. Limit naps during the day to 30 minutes or less. Do not use any products that contain nicotine or tobacco, such as cigarettes or e-cigarettes. If you need help quitting, ask your health care provider. Do not drink caffeinated beverages for at least 8 hours before going to bed. Coffee, tea, and some sodas contain caffeine. Do not drink alcohol close to bedtime. Do not eat large meals close to bedtime. Do not take naps in the late afternoon. Try to get at least 30 minutes of sunlight every day. Morning sunlight is best. Make time  to relax before bed. Reading, listening to music, or taking a hot bath promotes quality sleep. Make your bedroom a place that promotes quality sleep. Keep your bedroom dark, quiet, and at a comfortable room temperature. Make sure your bed is comfortable. Take out sleep distractions like TV, a computer, smartphone, and bright lights. If you are lying  awake in bed for longer than 20 minutes, get up and do a relaxing activity until you feel sleepy. Work with your health care provider to treat medical conditions that may affect sleeping, such as: Nasal obstruction. Snoring. Sleep apnea and other sleep disorders. Talk to your health care provider if you think any of your prescription medicines may cause you to have difficulty falling or staying asleep. If you have sleep problems, talk with a sleep consultant. If you think you have a sleep disorder, talk with your health care provider about getting evaluated by a specialist. Where to find more information [place] website: https://sleepfoundation.org National [facility] [place] ([place]): http://www.saunders.info/.pdf Centers for Disease Control and Prevention (CDC): LearningDermatology.pl Contact a health care provider if you: Have trouble getting to sleep or staying asleep. Often wake up very early in the morning and cannot get back to sleep. Have daytime sleepiness. Have daytime sleep attacks of suddenly falling asleep and sudden muscle weakness (narcolepsy). Have a tingling sensation in your legs with a strong urge to move your legs (restless legs syndrome). Stop breathing briefly during sleep (sleep apnea). Think you have a sleep disorder or are taking a medicine that is affecting your quality of sleep. Summary Most adults need 7-8 hours of quality sleep each night. Getting enough quality sleep is an important part of health and well-being. Make your bedroom a place that promotes quality sleep and avoid things that may cause you to have poor sleep, such as alcohol, caffeine, smoking, and large meals. Talk to your health care provider if you have trouble falling asleep or staying asleep. This information is not intended to replace advice given to you by your health care provider. Make sure you discuss any questions you have  with your healthcare provider. Document Revised: 07/06/2017 Document Reviewed: 07/06/2017 Elsevier Patient Education  [place].

## 2020-11-27 NOTE — Progress Notes (Signed)
SLEEP MEDICINE CLINIC    Provider:  Melvyn Novas, MD  Primary Care Physician:  Dorothyann Peng, MD [address] [place] [address]     Referring Provider: Dorothyann Peng, Md [address] [place],  [place] [address]   PRIMARY NEUROLOGIST IS Dr Marjory Lies:          Chief Complaint according to patient   Patient presents with:     New Patient (Initial Visit)      rm 10. Presents today for concern for sleep apnea. Never had SS. Avg about 7-8 hr of sleep during the night but broken and not restful. She wakes up still feeling tired and sometimes with dull headaches. Has been told she snores in sleep and possibly has irregular breathing      HISTORY OF PRESENT ILLNESS:  Leslie Salazar is a 55 y.o. year old Black or [place] American female patient seen here as a referral on 11/27/2020.  Chief concern according to patient :  non restorative sleep, worse since oophorectomy. Sister's have witnessed  snoring, and gasping in her sleep. Hot flushes, sleep headaches, palpitations.     I have the pleasure of seeing Leslie Salazar today, a right -handed Black or [place] American female with a possible sleep disorder.  She has a  has a past medical history of Acne, breast Cancer ([facility]) (2012), mastectomy bilateral- 04/2011. Complication of anesthesia, Condyloma, DCIS (ductal carcinoma in situ), Fibroids, Heart palpitations, High cholesterol, History of angina, hx of radiation therapy (2013), Hypertension, Left ovarian cyst, Scoliosis, Urinary urgency, and non restorative sleep. Ovariectomy, 2021-Medical menopause, 6.5 years on tamoxifen. Weight gain.     Sleep relevant medical history: Nocturia 2- more,seasonal allergic rhinitis.  post nasal drip.   Family medical /sleep history: no other family member on CPAP with OSA, neither insomnia. .    Social history:  Patient is working as a Teaching laboratory technician of [employer] and lives in a household with spouse. Family  status is married , with children , adult son is 71, not living at home. No pets.  The patient currently works daytime, partly of from home . Tobacco use/.  ETOH use 2/ month , Caffeine intake in form of Coffee( 1 cup in AM ) , routinely GYM visits, private trainer.  Hobbies : travelling, walking.       Sleep habits are as follows: The patient's dinner time is between 6 PM. The patient goes to bed at 10 PM and often struggles to go to sleep before midnight- continues to sleep for 2 hours, wakes for 1-2 bathroom breaks, the first time at 2.30 AM.  The bedroom is quit and cool, and daylight penetrates the room in the morning.  The preferred sleep position is side and prone , with the support of 2 pillows.  Dreams are reportedly frequent.  6-7  AM is the usual rise time. The patient wakes up with an alarm.  She reports not feeling refreshed or restored in AM, with symptoms such as  tingling  in the right hand- dry mouth, morning headaches, and residual fatigue.  Naps are taken infrequently, lasting from 15 to 30 minutes and are more refreshing than nocturnal sleep.    Review of Systems: Out of a complete 14 system review, the patient complains of only the following symptoms, and all other reviewed systems are negative.:  Fatigue, sleepiness , snoring, fragmented sleep, Insomnia - since full menopause.    How likely are you  to doze in the following situations: 0 = not likely, 1 = slight chance, 2 = moderate chance, 3 = high chance   Sitting and Reading? Watching Television? Sitting inactive in a public place (theater or meeting)? As a passenger in a car for an hour without a break? Lying down in the afternoon when circumstances permit? Sitting and talking to someone? Sitting quietly after lunch without alcohol? In a car, while stopped for a few minutes in traffic?   Total = 15/ 24 points   FSS endorsed at 38/ 63 points. Since her oophorectomy  Social History   Socioeconomic History    Marital status: Married    Spouse name: Not on file   Number of children: 1   Years of education: Not on file   Highest education level: Master's degree (e.g., MA, MS, MEng, MEd, MSW, MBA)  Occupational History   Not on file  Tobacco Use   Smoking status: Never   Smokeless tobacco: Never  Vaping Use   Vaping Use: Never used  Substance and Sexual Activity   Alcohol use: Yes    Alcohol/week: 1.0 standard drink    Types: 1 Glasses of wine per week   Drug use: No   Sexual activity: Not Currently    Birth control/protection: Surgical, Condom    Comment: 1ST INTERCOURSE- 48, PARTNERS- 4  Other Topics Concern   Not on file  Social History Narrative   Lives with husband   Social Determinants of Health   Financial Resource Strain: Not on file  Food Insecurity: Not on file  Transportation Needs: Not on file  Physical Activity: Not on file  Stress: Not on file  Social Connections: Not on file    Family History  Problem Relation Age of Onset   Colon cancer Paternal Aunt    Diabetes Mother        HAS HAD A KIDNEY TRANSPLATN   Hypertension Mother    Kidney failure Mother    Hypertension Father    Gallstones Father    Gallstones Sister     Past Medical History:  Diagnosis Date   Acne    Cancer ([place]) 2012   RIGHT DUCTAL CARCINOMA INSITU.[facility]   Complication of anesthesia    Condyloma    ON BUTTOCKS   DCIS (ductal carcinoma in situ)    BRCA 1and 2 Neg./Pos ER, Pos PR   Fibroids    Heart palpitations    High cholesterol    History of angina    HSV-1 (herpes simplex virus 1) infection    Hx of radiation therapy 2013   from april to may - six weeks   Hypertension    Left ovarian cyst    PONV (postoperative nausea and vomiting)    nausea no vomiting   Scoliosis    Urinary urgency    Wears glasses     Past Surgical History:  Procedure Laterality Date   BREAST CAPSULECTOMY WITH IMPLANT EXCHANGE Right 06/27/2019   Procedure: release of capsular contracture with  repositioning of the right implant with possible exchange of right breast implant;  Surgeon: Wallace Going, DO;  Location: [facility];  Service: Plastics;  Laterality: Right;   BREAST RECONSTRUCTION WITH PLACEMENT OF TISSUE EXPANDER AND FLEX HD (ACELLULAR HYDRATED DERMIS) Right 11/16/2012   Procedure: RIGHT LATISSIMUS myocutaneous FLAP WITH PLACEMENT OF TISSUE EXPANDER  TO RIGHT BREAST;  Surgeon: Theodoro Kos, DO;  Location: [facility];  Service: Plastics;  Laterality: Right;   CARDIAC CATHETERIZATION N/A  02/28/2015   Procedure: Left Heart Cath and Coronary Angiography;  Surgeon: Adrian Prows, MD;  Location: [facility];  Service: Cardiovascular;  Laterality: N/A;   CESAREAN SECTION  2000   ENDOMETRIAL ABLATION W/ NOVASURE  2007   INSERTION OF TISSUE EXPANDER AFTER MASTECTOMY Bilateral 04/20/2011   Breast Ca (r) T1b N0 M0 stage IA infiltrating ductal carcinoma   LAPAROSCOPIC BILATERAL SALPINGO OOPHERECTOMY Bilateral 08/30/2018   Procedure: LAPAROSCOPIC BILATERAL SALPINGO OOPHORECTOMY w/ PERITONEAL WASHINGS;  Surgeon: Princess Bruins, MD;  Location: [facility];  Service: Gynecology;  Laterality: Bilateral;   LASER ABLATION OF THE CERVIX  1994   FOR DYSPLASIA   LIPOSUCTION WITH LIPOFILLING Right 03/14/2013   Procedure: LIPOSUCTION WITH LIPOFILLING;  Surgeon: Theodoro Kos, DO;  Location: [facility];  Service: Plastics;  Laterality: Right;   LIPOSUCTION WITH LIPOFILLING Right 06/27/2019   Procedure: Fat grafting to right breast with excision of back excess skin;  Surgeon: Wallace Going, DO;  Location: [facility];  Service: Plastics;  Laterality: Right;  2 hours, please   RECONSTRUCTION BREAST W/ LATISSIMUS DORSI FLAP Right 11/16/2012   REMOVAL OF TISSUE EXPANDER AND PLACEMENT OF IMPLANT Right 03/14/2013   Procedure: REMOVAL OF RIGHT BREAST TISSUE EXPANDER WITH PLACEMENT OF RIGHT BREAST IMPLANT;  Surgeon: Theodoro Kos, DO;  Location:  [facility];  Service: Plastics;  Laterality: Right;  Removal of Right Breast Tissue Expander with Placement of Right Breast Implant, Possible Lipsuction with Lipofilling   TISSUE EXPANDER PLACEMENT Right 11/16/2012   TISSUE EXPANDER REMOVAL Bilateral 12/2011   TUBAL LIGATION  09/08/2001   BY LAPAROSCOPY ,, HULKA CLIPS TECHNIQUE   WISDOM TOOTH EXTRACTION       Current Outpatient Medications on File Prior to Visit  Medication Sig Dispense Refill   acetaminophen (TYLENOL) 500 MG tablet Take 500 mg by mouth every 6 (six) hours as needed.     aspirin EC 81 MG tablet Take 81 mg by mouth every morning.     atorvastatin (LIPITOR) 20 MG tablet Take 1 tablet (20 mg total) by mouth daily. 90 tablet 1   Cholecalciferol 25 MCG (1000 UT) tablet Take 1 tablet by mouth daily. 2000u     Coenzyme Q10 100 MG capsule Take 1 capsule by mouth daily.     Estradiol 10 MCG TABS vaginal tablet Place 1 tablet (10 mcg total) vaginally once a week. 12 tablet 4   hydrochlorothiazide (MICROZIDE) 12.5 MG capsule TAKE 1 CAPSULE(12.5 MG) BY MOUTH DAILY 90 capsule 1   hydrochlorothiazide (MICROZIDE) 12.5 MG capsule Take 1 capsule (12.5 mg total) by mouth daily. 90 capsule 1   hydrocortisone (ANUSOL-HC) 2.5 % rectal cream Place 1 application rectally 2 (two) times daily. (Patient taking differently: Place 1 application rectally as needed.) 30 g 3   imiquimod (ALDARA) 5 % cream Apply to affected area overnight  three times weekly as needed 12 each 1   loratadine (CLARITIN) 10 MG tablet Take 10 mg by mouth every morning.      metoprolol succinate (TOPROL-XL) 25 MG 24 hr tablet TAKE 1 TABLET(25 MG) BY MOUTH DAILY 90 tablet 1   montelukast (SINGULAIR) 10 MG tablet Take 1 tablet (10 mg total) by mouth daily. 30 tablet 2   Multiple Vitamins-Minerals (MULTIVITAMIN WOMENS 50+ ADV PO) Take by mouth.     Omega-3 Fatty Acids (FISH OIL PO) Take by mouth. 1 capsule daily     valACYclovir (VALTREX) 500 MG tablet Take 1 tablet  (500  mg total) by mouth daily. 90 tablet 4   No current facility-administered medications on file prior to visit.    No Known Allergies  Physical exam:  Today's Vitals   11/27/20 0840  BP: 130/86  Pulse: (!) 59  Weight: 161 lb (73 kg)  Height: 5' (1.524 m)   Body mass index is 31.44 kg/m.   Wt Readings from Last 3 Encounters:  11/27/20 161 lb (73 kg)  09/10/20 159 lb 9.6 oz (72.4 kg)  09/04/20 161 lb (73 kg)     Ht Readings from Last 3 Encounters:  11/27/20 5' (1.524 m)  09/10/20 5' 1.8" (1.57 m)  09/04/20 $RemoveB'5\' 2"'nmoSttcX$  (1.575 m)      General: The patient is awake, alert and appears not in acute distress. The patient is well groomed. Head: Normocephalic, atraumatic. Neck is supple. Mallampati 3 plus ,  neck circumference:15 inches . Nasal airflow  patent.  Retrognathia is present .  Dental status: small lower jaw, scalloped tongue.  Cardiovascular:  Regular rate and cardiac rhythm by pulse,  without distended neck veins. Respiratory: Lungs are clear to auscultation.  Skin:  Without evidence of ankle edema, or rash. Trunk: The patient's posture is erect.   Neurologic exam : The patient is awake and alert, oriented to place and time.   Memory subjective described as intact.  Attention span & concentration ability appears normal.  Speech is fluent,  without  dysarthria, dysphonia or aphasia.  Mood and affect are appropriate.   Cranial nerves: no loss of smell or taste reported  Pupils are equal and briskly reactive to light. Funduscopic exam deferred. .  Extraocular movements in vertical and horizontal planes were intact and without nystagmus. No Diplopia. Visual fields by finger perimetry are intact. Hearing was intact to soft voice and finger rubbing.    Facial sensation intact to fine touch.  Facial motor strength is symmetric and tongue and uvula move midline.  Neck ROM : rotation, tilt and flexion extension were normal for age and shoulder shrug was symmetrical.     Motor exam:  Symmetric bulk, tone and ROM.   Normal tone without cog -wheeling, symmetric grip strength .   Sensory:  Fine touch and vibration were  normal.  Proprioception tested in the upper extremities was normal.   Coordination: Rapid alternating movements in the fingers/hands were of normal speed.  The Finger-to-nose maneuver was intact without evidence of ataxia, dysmetria or tremor.   Gait and station: Patient could rise unassisted from a seated position, walked without assistive device.  Stance is of normal width/ base and the patient turned with 3 steps ( RN observation) .  Toe and heel walk were deferred.  Deep tendon reflexes: in the  upper and lower extremities are symmetric and intact.  Babinski response was deferred.    reviewed medication, NCV and EMG from 4/10-2020 with Dr Gladstone Lighter.    After spending a total time of 35 minutes face to face and additional time for physical and neurologic examination, review of laboratory studies,  personal review of imaging studies, reports and results of other testing and review of referral information / records as far as provided in visit, I have established the following assessments:  1)Menopausal sleep fragmentation, and  2)excessive daytime sleepiness, falling asleep whenever not physically active or mentally stimulated. 3)snoring and witnessed apnea.    My Plan is to proceed with:  1)screening for apnea with HST or PSG,but also cramping. No neuropathy 2) hot flushes - sleep fragmentation, light  sleep, nocturia,  3) insomnia at onset as well - she is hot.   I would like to thank Glendale Chard, [address] [place],  [place] [address] for allowing me to meet with and to take care of this pleasant patient.   In short, Leslie Salazar is presenting with above symptoms. , I plan to follow up either personally or through our NP within 2-4 month.   CC: I will share my notes with Dr Baird Cancer and Dr. Leta Baptist  .  Electronically signed by: Larey Seat, MD 11/27/2020 9:06 AM  [facility] Board certified by The [employer] of Sleep Medicine and Diplomate of the [employer] of Sleep Medicine. Board certified In Neurology through the [place], Fellow of the [employer] of Neurology. Medical Director of [facility].

## 2020-12-19 ENCOUNTER — Telehealth: Payer: Self-pay | Admitting: Neurology

## 2020-12-19 NOTE — Telephone Encounter (Signed)
Pt called wanting to know the update on scheduling her HST. Pt requesting a call back.

## 2020-12-22 ENCOUNTER — Other Ambulatory Visit: Payer: Self-pay | Admitting: Internal Medicine

## 2020-12-30 NOTE — Telephone Encounter (Signed)
Pt called, checking status on scheduling sleep lab appt. Would like a call a back.

## 2020-12-31 ENCOUNTER — Encounter: Payer: Self-pay | Admitting: Neurology

## 2021-01-13 ENCOUNTER — Institutional Professional Consult (permissible substitution): Payer: Federal, State, Local not specified - PPO | Admitting: Neurology

## 2021-01-14 ENCOUNTER — Ambulatory Visit (INDEPENDENT_AMBULATORY_CARE_PROVIDER_SITE_OTHER): Payer: Federal, State, Local not specified - PPO | Admitting: Neurology

## 2021-01-14 DIAGNOSIS — G4719 Other hypersomnia: Secondary | ICD-10-CM

## 2021-01-14 DIAGNOSIS — R519 Headache, unspecified: Secondary | ICD-10-CM

## 2021-01-14 DIAGNOSIS — G4733 Obstructive sleep apnea (adult) (pediatric): Secondary | ICD-10-CM | POA: Diagnosis not present

## 2021-01-14 DIAGNOSIS — N951 Menopausal and female climacteric states: Secondary | ICD-10-CM

## 2021-01-14 DIAGNOSIS — R002 Palpitations: Secondary | ICD-10-CM

## 2021-01-22 NOTE — Progress Notes (Signed)
[facility] TEST REPORT ( by Watch PAT)   STUDY DATE:  01-22-2021 DOB:  [birth year]    ORDERING CLINICIAN: Larey Seat, MD  REFERRING CLINICIAN: Glendale Chard, MD    CLINICAL INFORMATION/HISTORY:  11/27/2020. Her primary Neurologist is Dr. Leta Baptist, MD.   Chief concern according to patient :  non restorative sleep, worse since oophorectomy. Her Sister's have witnessed her snoring, and gasping in her sleep. Hot flushes, sleep headaches, palpitations.    Leslie Salazar  has a past medical history of Acne, breast Cancer ([facility]) (2012), mastectomy bilateral- 04/2011. Complication of anesthesia, History of angina, hx of radiation therapy (2013),Condyloma, DCIS (ductal carcinoma in situ), Fibroids, Heart palpitations, High cholesterol, , Hypertension, Left ovarian cyst, Scoliosis, Urinary urgency, and non restorative sleep. Ovariectomy, 2021-Medical menopause, 6.5 years on tamoxifen and related Weight gain.     Epworth sleepiness score:15 /24.   BMI: 31.6 kg/m   Neck Circumference: 15"   FINDINGS:   Sleep Summary:   Total Recording Time (hours, min): Amounted to 9 hours and 39 minutes of which 8 hours and 57 minutes were recorded sleep time.  REM sleep amounted to 25% of total sleep time.                                     Respiratory Indices:   Calculated pAHI (per hour):   The overall AHI( apnea hypopnea index) was calculated at 17.4/h during REM sleep much higher at 34.4/h and in non-REM sleep 11.8/h.                                                 Positional AHI: In supine sleep the AHI was 18.9/h the RDI 21/h in nonsupine sleep AHI was 14.2/h and RDI 16.1/h.  The patient slept mainly in supine position with 349 minutes.  Snoring level was moderate with a mean volume of 40 dB.  Snoring accompanied only 4% of the total sleep time.                                                 Oxygen Saturation Statistics:   O2 Saturation Range (%):   Between a  nadir at 83% and a maximum of 98% with a mean oxygen saturation of 92%.                                    O2 Saturation (minutes) <89%: Amounted to 2.1 minutes the equivalent of 0.4% of the total sleep time.         Pulse Rate Statistics:        Pulse Range:   Between 50 bpm at 101 bpm with a mean heart rate of 64 bpm.              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  No major hypoxia events were noted, heart rate varies been in normal range, and REM sleep was present there is a  proportion of 25%.   RECOMMENDATION: Given the REM sleep distribution I would strongly recommend positive airway pressure therapy which will also address snoring,as the patient does report excessive daytime sleepiness (which may well respond to the sleep apnea treatments).  Auto titration CPAP would be recommended, with a setting between 6 and 16 cmH2O to centimeter EPR, heated humidification and mask of choice.  For the hormonal related hot flashes and insomnia there different treatment approaches that are not requiring hormone replacement therapy. Phytohormones deriving from [facility], and the use of SSRI for example.    INTERPRETING PHYSICIAN:   Larey Seat, MD   Medical Director of [facility] at [facility].

## 2021-01-28 DIAGNOSIS — R002 Palpitations: Secondary | ICD-10-CM | POA: Insufficient documentation

## 2021-01-28 DIAGNOSIS — R519 Headache, unspecified: Secondary | ICD-10-CM | POA: Insufficient documentation

## 2021-01-28 NOTE — Progress Notes (Signed)
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  No major hypoxia events were noted, heart rate varies been in normal range, and REM sleep was present there is a proportion of 25%.  RECOMMENDATION: Given the REM sleep distribution I would strongly recommend positive airway pressure therapy which will also address snoring,as the patient does report excessive daytime sleepiness (which may well respond to the sleep apnea treatments).  Auto titration CPAP would be recommended, with a setting between 6 and 16 cmH2O to centimeter EPR, heated humidification and mask of choice.  For the hormonal related hot flashes and insomnia there different treatment approaches that are not requiring hormone replacement therapy. Phytohormones deriving from [facility], and the use of SSRI for example.

## 2021-01-28 NOTE — Addendum Note (Signed)
Addended by: Larey Seat on: 01/28/2021 05:53 PM   Modules accepted: Orders

## 2021-01-28 NOTE — Procedures (Signed)
[facility] TEST REPORT ( by Watch PAT)   STUDY DATE:  01-22-2021 DOB:  06-08-1965    ORDERING CLINICIAN: Larey Seat, MD  REFERRING CLINICIAN: Glendale Chard, MD    CLINICAL INFORMATION/HISTORY:  11/27/2020. Her primary Neurologist is Dr. Leta Baptist, MD.   Chief concern according to patient :  non restorative sleep, worse since oophorectomy. Her Sister's have witnessed her snoring, and gasping in her sleep. Hot flushes, sleep headaches, palpitations.    Leslie Salazar  has a past medical history of Acne, breast Cancer ([place]) (2012), mastectomy bilateral- 04/2011. Complication of anesthesia, History of angina, hx of radiation therapy (2013),Condyloma, DCIS (ductal carcinoma in situ), Fibroids, Heart palpitations, High cholesterol, , Hypertension, Left ovarian cyst, Scoliosis, Urinary urgency, and non restorative sleep. Ovariectomy, 2021-Medical menopause, 6.5 years on tamoxifen and related Weight gain.     Epworth sleepiness score:15 /24.   BMI: 31.6 kg/m   Neck Circumference: 15"   FINDINGS:   Sleep Summary:   Total Recording Time (hours, min): Amounted to 9 hours and 39 minutes of which 8 hours and 57 minutes were recorded sleep time.  REM sleep amounted to 25% of total sleep time.                                     Respiratory Indices:   Calculated pAHI (per hour):   The overall AHI( apnea hypopnea index) was calculated at 17.4/h during REM sleep much higher at 34.4/h and in non-REM sleep 11.8/h.                                                 Positional AHI: In supine sleep the AHI was 18.9/h the RDI 21/h in nonsupine sleep AHI was 14.2/h and RDI 16.1/h.  The patient slept mainly in supine position with 349 minutes.  Snoring level was moderate with a mean volume of 40 dB.  Snoring accompanied only 4% of the total sleep time.                                                 Oxygen Saturation Statistics:   O2 Saturation Range (%):   Between a nadir  at 83% and a maximum of 98% with a mean oxygen saturation of 92%.                                    O2 Saturation (minutes) <89%: Amounted to 2.1 minutes the equivalent of 0.4% of the total sleep time.         Pulse Rate Statistics:        Pulse Range:   Between 50 bpm at 101 bpm with a mean heart rate of 64 bpm.              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  No major hypoxia events were noted, heart rate varies been in normal range, and REM sleep was present there is a proportion of 25%.  RECOMMENDATION: Given the REM sleep distribution I would strongly recommend positive airway pressure therapy which will also address snoring,as the patient does report excessive daytime sleepiness (which may well respond to the sleep apnea treatments).  Auto titration CPAP would be recommended, with a setting between 6 and 16 cmH2O to centimeter EPR, heated humidification and mask of choice.  For the hormonal related hot flashes and insomnia there different treatment approaches that are not requiring hormone replacement therapy. Phytohormones deriving from [facility], and the use of SSRI for example.    INTERPRETING PHYSICIAN:   Larey Seat, MD   Medical Director of [facility] at [facility].

## 2021-01-29 ENCOUNTER — Telehealth: Payer: Self-pay | Admitting: *Deleted

## 2021-01-29 NOTE — Telephone Encounter (Signed)
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  No major hypoxia events were noted, heart rate varies been in normal range, and REM sleep was present there is a proportion of 25%.  RECOMMENDATION: Given the REM sleep distribution I would strongly recommend positive airway pressure therapy which will also address snoring,as the patient does report excessive daytime sleepiness (which may well respond to the sleep apnea treatments).  Auto titration CPAP would be recommended, with a setting between 6 and 16 cmH2O to centimeter EPR, heated humidification and mask of choice.  For the hormonal related hot flashes and insomnia there different treatment approaches that are not requiring hormone replacement therapy. Phytohormones deriving from [facility], and the use of SSRI for example.

## 2021-01-29 NOTE — Telephone Encounter (Signed)
I called pt. I advised pt that Dr. Brett Fairy reviewed their sleep study results and found that pt has sleep apnea. Dr. Brett Fairy recommends that pt start cpap. I reviewed PAP compliance expectations with the pt. Pt is agreeable to starting a CPAP. I advised pt that an order will be sent to a DME, [facility], and [facility] will call the pt within about one week after they file with the pt's insurance. [employer] will show the pt how to use the machine, fit for masks, and troubleshoot the CPAP if needed. A follow up appt was made for insurance purposes with AL,NP on 04/14/21 at 2:30pm. Pt verbalized understanding to arrive 15 minutes early and bring their CPAP. A letter with all of this information in it will be mailed to the pt as a reminder. I verified with the pt that the address we have on file is correct. Pt verbalized understanding of results. Pt had no questions at this time but was encouraged to call back if questions arise. I have sent the order to [employer] and have received confirmation that they have received the order.

## 2021-01-31 DIAGNOSIS — M7061 Trochanteric bursitis, right hip: Secondary | ICD-10-CM | POA: Diagnosis not present

## 2021-01-31 DIAGNOSIS — M25559 Pain in unspecified hip: Secondary | ICD-10-CM | POA: Diagnosis not present

## 2021-01-31 DIAGNOSIS — M25551 Pain in right hip: Secondary | ICD-10-CM | POA: Diagnosis not present

## 2021-02-09 ENCOUNTER — Other Ambulatory Visit: Payer: Self-pay | Admitting: Internal Medicine

## 2021-02-15 DIAGNOSIS — J029 Acute pharyngitis, unspecified: Secondary | ICD-10-CM | POA: Diagnosis not present

## 2021-03-16 ENCOUNTER — Ambulatory Visit: Payer: Federal, State, Local not specified - PPO | Admitting: Internal Medicine

## 2021-03-18 DIAGNOSIS — G4733 Obstructive sleep apnea (adult) (pediatric): Secondary | ICD-10-CM | POA: Diagnosis not present

## 2021-03-25 ENCOUNTER — Other Ambulatory Visit: Payer: Self-pay

## 2021-03-25 ENCOUNTER — Ambulatory Visit (INDEPENDENT_AMBULATORY_CARE_PROVIDER_SITE_OTHER): Payer: Federal, State, Local not specified - PPO | Admitting: Internal Medicine

## 2021-03-25 ENCOUNTER — Encounter: Payer: Self-pay | Admitting: Internal Medicine

## 2021-03-25 VITALS — BP 114/70 | HR 63 | Temp 98.7°F | Ht 60.0 in | Wt 167.0 lb

## 2021-03-25 DIAGNOSIS — I10 Essential (primary) hypertension: Secondary | ICD-10-CM | POA: Insufficient documentation

## 2021-03-25 DIAGNOSIS — E6609 Other obesity due to excess calories: Secondary | ICD-10-CM | POA: Diagnosis not present

## 2021-03-25 DIAGNOSIS — R635 Abnormal weight gain: Secondary | ICD-10-CM | POA: Insufficient documentation

## 2021-03-25 DIAGNOSIS — G4733 Obstructive sleep apnea (adult) (pediatric): Secondary | ICD-10-CM | POA: Diagnosis not present

## 2021-03-25 DIAGNOSIS — Z6832 Body mass index (BMI) 32.0-32.9, adult: Secondary | ICD-10-CM

## 2021-03-25 DIAGNOSIS — Z9989 Dependence on other enabling machines and devices: Secondary | ICD-10-CM

## 2021-03-25 MED ORDER — ATORVASTATIN CALCIUM 20 MG PO TABS: 20.0000 mg | ORAL_TABLET | Freq: Every day | ORAL | 1 refills | Status: DC

## 2021-03-25 MED ORDER — HYDROCHLOROTHIAZIDE 12.5 MG PO CAPS: 12.5000 mg | ORAL_CAPSULE | Freq: Every day | ORAL | 1 refills | Status: DC

## 2021-03-25 MED ORDER — TRAZODONE HCL 50 MG PO TABS: 50.0000 mg | ORAL_TABLET | Freq: Every evening | ORAL | 1 refills | Status: DC | PRN

## 2021-03-25 MED ORDER — METOPROLOL SUCCINATE ER 25 MG PO TB24: ORAL_TABLET | ORAL | 1 refills | Status: DC

## 2021-03-25 NOTE — Progress Notes (Signed)
I,Katawbba Wiggins,acting as a Education administrator for Maximino Greenland, MD.,have documented all relevant documentation on the behalf of Maximino Greenland, MD,as directed by  Maximino Greenland, MD while in the presence of Maximino Greenland, MD.  This visit occurred during the SARS-CoV-2 public health emergency.  Safety protocols were in place, including screening questions prior to the visit, additional usage of staff PPE, and extensive cleaning of exam room while observing appropriate contact time as indicated for disinfecting solutions.  Subjective:     Patient ID: Leslie Salazar , female    DOB: Jul 31, 1965 , 55 y.o.   MRN: [id number]   Chief Complaint  Patient presents with   Hypertension    HPI  She presents today for BP check. Reports compliance with meds. States she is still exercising; however, now goes to the gym in the afternoons. Has had Neuro eval, she has recent diagnosis of OSA, only recently received her CPAP. She is hopeful that this will give her more energy.   Hypertension This is a chronic problem. The current episode started more than 1 year ago. The problem has been rapidly improving since onset. The problem is controlled. Pertinent negatives include no blurred vision, chest pain, headaches, neck pain, orthopnea or shortness of breath. Risk factors for coronary artery disease include post-menopausal state. The current treatment provides moderate improvement. There are no compliance problems.     Past Medical History:  Diagnosis Date   Acne    Cancer ([facility]) 2012   RIGHT DUCTAL CARCINOMA INSITU.Marland Kitchen   Complication of anesthesia    Condyloma    ON BUTTOCKS   DCIS (ductal carcinoma in situ)    BRCA 1and 2 Neg./Pos ER, Pos PR   Fibroids    Heart palpitations    High cholesterol    History of angina    HSV-1 (herpes simplex virus 1) infection    Hx of radiation therapy 2013   from april to may - six weeks   Hypertension    Left ovarian cyst    PONV (postoperative nausea and vomiting)     nausea no vomiting   Scoliosis    Urinary urgency    Wears glasses      Family History  Problem Relation Age of Onset   Colon cancer Paternal Aunt    Diabetes Mother        HAS HAD A KIDNEY TRANSPLATN   Hypertension Mother    Kidney failure Mother    Hypertension Father    Gallstones Father    Gallstones Sister      Current Outpatient Medications:    acetaminophen (TYLENOL) 500 MG tablet, Take 500 mg by mouth every 6 (six) hours as needed., Disp: , Rfl:    aspirin EC 81 MG tablet, Take 81 mg by mouth every morning., Disp: , Rfl:    Cholecalciferol 25 MCG (1000 UT) tablet, Take 1 tablet by mouth daily. 2000u, Disp: , Rfl:    Coenzyme Q10 100 MG capsule, Take 1 capsule by mouth daily., Disp: , Rfl:    Estradiol 10 MCG TABS vaginal tablet, Place 1 tablet (10 mcg total) vaginally once a week., Disp: 12 tablet, Rfl: 4   hydrocortisone (ANUSOL-HC) 2.5 % rectal cream, Place 1 application rectally 2 (two) times daily. (Patient taking differently: Place 1 application rectally as needed.), Disp: 30 g, Rfl: 3   loratadine (CLARITIN) 10 MG tablet, Take 10 mg by mouth every morning. , Disp: , Rfl:    Multiple Vitamins-Minerals (MULTIVITAMIN WOMENS 50+  ADV PO), Take by mouth., Disp: , Rfl:    Omega-3 Fatty Acids (FISH OIL PO), Take by mouth. 1 capsule daily, Disp: , Rfl:    valACYclovir (VALTREX) 500 MG tablet, Take 1 tablet (500 mg total) by mouth daily., Disp: 90 tablet, Rfl: 4   atorvastatin (LIPITOR) 20 MG tablet, Take 1 tablet (20 mg total) by mouth daily., Disp: 90 tablet, Rfl: 1   hydrochlorothiazide (MICROZIDE) 12.5 MG capsule, Take 1 capsule (12.5 mg total) by mouth daily., Disp: 90 capsule, Rfl: 1   metoprolol succinate (TOPROL-XL) 25 MG 24 hr tablet, TAKE 1 TABLET(25 MG) BY MOUTH DAILY, Disp: 90 tablet, Rfl: 1   montelukast (SINGULAIR) 10 MG tablet, TAKE 1 TABLET(10 MG) BY MOUTH DAILY (Patient not taking: Reported on 03/25/2021), Disp: 30 tablet, Rfl: 2   traZODone (DESYREL) 50 MG  tablet, Take 1 tablet (50 mg total) by mouth at bedtime as needed for sleep., Disp: 90 tablet, Rfl: 1   No Known Allergies   Review of Systems  Constitutional: Negative.   Eyes:  Negative for blurred vision.  Respiratory: Negative.  Negative for shortness of breath.   Cardiovascular:  Negative for chest pain and orthopnea.  Gastrointestinal: Negative.   Musculoskeletal:  Negative for neck pain.  Neurological:  Negative for headaches.  Psychiatric/Behavioral: Negative.    All other systems reviewed and are negative.   Today's Vitals   03/25/21 1412  BP: 114/70  Pulse: 63  Temp: 98.7 F (37.1 C)  Weight: 167 lb (75.8 kg)  Height: 5' (1.524 m)   Body mass index is 32.61 kg/m.  Wt Readings from Last 3 Encounters:  03/25/21 167 lb (75.8 kg)  11/27/20 161 lb (73 kg)  09/10/20 159 lb 9.6 oz (72.4 kg)   BP Readings from Last 3 Encounters:  03/25/21 114/70  11/27/20 130/86  09/10/20 134/82    Objective:  Physical Exam Vitals and nursing note reviewed.  Constitutional:      Appearance: Normal appearance.  HENT:     Head: Normocephalic and atraumatic.     Nose:     Comments: Masked     Mouth/Throat:     Comments: Masked  Eyes:     Extraocular Movements: Extraocular movements intact.  Cardiovascular:     Rate and Rhythm: Normal rate and regular rhythm.     Heart sounds: Normal heart sounds.  Pulmonary:     Effort: Pulmonary effort is normal.     Breath sounds: Normal breath sounds.  Musculoskeletal:     Cervical back: Normal range of motion.     Right lower leg: No edema.     Left lower leg: No edema.  Skin:    General: Skin is warm.  Neurological:     General: No focal deficit present.     Mental Status: She is alert.  Psychiatric:        Mood and Affect: Mood normal.        Behavior: Behavior normal.        Assessment And Plan:     1. Essential hypertension, benign Comments: Chronic, well controlled. She is encouraged to follow low sodium diet. I will  check renal function today. She will f/u in 6 months for CPE.  - CMP14+EGFR  2. OSA on CPAP Comments: Newly diagnosed. Just got CPAP w/in the past week, encouraged to use at least 4 hours/night.   3. Weight gain Comments: She is aware of 6 pound weight gain since August 2022. I will check thyroid function  today. Encouraged to limit her intake of refined carbs.  - Hemoglobin A1c  4. Class 1 obesity due to excess calories with serious comorbidity and body mass index (BMI) of 32.0 to 32.9 in adult Comments: She is advised to aim for at least 150 minutes of exercise per week.   Patient was given opportunity to ask questions. Patient verbalized understanding of the plan and was able to repeat key elements of the plan. All questions were answered to their satisfaction.   I, Maximino Greenland, MD, have reviewed all documentation for this visit. The documentation on 03/25/21 for the exam, diagnosis, procedures, and orders are all accurate and complete.   IF YOU HAVE BEEN REFERRED TO A SPECIALIST, IT MAY TAKE 1-2 WEEKS TO SCHEDULE/PROCESS THE REFERRAL. IF YOU HAVE NOT HEARD FROM US/SPECIALIST IN TWO WEEKS, PLEASE GIVE [place] A CALL AT [phone].   THE PATIENT IS ENCOURAGED TO PRACTICE SOCIAL DISTANCING DUE TO THE COVID-19 PANDEMIC.

## 2021-03-25 NOTE — Patient Instructions (Signed)

## 2021-03-26 LAB — CMP14+EGFR
ALT: 21 IU/L (ref 0–32)
AST: 16 IU/L (ref 0–40)
Albumin/Globulin Ratio: 2 (ref 1.2–2.2)
Albumin: 4.9 g/dL (ref 3.8–4.9)
Alkaline Phosphatase: 83 IU/L (ref 44–121)
BUN/Creatinine Ratio: 22 (ref 9–23)
BUN: 18 mg/dL (ref 6–24)
Bilirubin Total: 0.7 mg/dL (ref 0.0–1.2)
CO2: 24 mmol/L (ref 20–29)
Calcium: 9.8 mg/dL (ref 8.7–10.2)
Chloride: 101 mmol/L (ref 96–106)
Creatinine, Ser: 0.81 mg/dL (ref 0.57–1.00)
Globulin, Total: 2.5 g/dL (ref 1.5–4.5)
Glucose: 82 mg/dL (ref 70–99)
Potassium: 4.1 mmol/L (ref 3.5–5.2)
Sodium: 142 mmol/L (ref 134–144)
Total Protein: 7.4 g/dL (ref 6.0–8.5)
eGFR: 86 mL/min/{1.73_m2} (ref >59)

## 2021-03-26 LAB — HEMOGLOBIN A1C
Est. average glucose Bld gHb Est-mCnc: 108 mg/dL
Hgb A1c MFr Bld: 5.4 % (ref 4.8–5.6)

## 2021-04-14 ENCOUNTER — Ambulatory Visit: Payer: Federal, State, Local not specified - PPO | Admitting: Family Medicine

## 2021-04-16 DIAGNOSIS — B349 Viral infection, unspecified: Secondary | ICD-10-CM | POA: Diagnosis not present

## 2021-04-16 DIAGNOSIS — R051 Acute cough: Secondary | ICD-10-CM | POA: Diagnosis not present

## 2021-04-16 DIAGNOSIS — Z20822 Contact with and (suspected) exposure to covid-19: Secondary | ICD-10-CM | POA: Diagnosis not present

## 2021-04-18 DIAGNOSIS — G4733 Obstructive sleep apnea (adult) (pediatric): Secondary | ICD-10-CM | POA: Diagnosis not present

## 2021-04-22 ENCOUNTER — Telehealth: Payer: Self-pay | Admitting: Neurology

## 2021-04-22 ENCOUNTER — Encounter: Payer: Self-pay | Admitting: Internal Medicine

## 2021-04-22 DIAGNOSIS — J159 Unspecified bacterial pneumonia: Secondary | ICD-10-CM | POA: Diagnosis not present

## 2021-04-22 NOTE — Telephone Encounter (Signed)
Called pt back. She went to urgent care last week. Tested for flu/covid which was negative. Told she had cold and to monitor. She went back today d/t chest pain. Dx w/ pneumonia. Prescribed antibiotics today that she needs to pick up yet from pharmacy to take for 5 days. She will resume once feeling better. She will call back once she restarts therapy. Educated she should make sure she is cleaning CPAP machine wel and frequently. She confirmed she is. She will call back if she runs into any more questions/concerns.

## 2021-04-22 NOTE — Telephone Encounter (Signed)
Pt called states she was just diagnosed with Pneumonia, however she just started her CPAP and wants to know should she lay off the CPAP for a bit until her pneumonia is treated for. Pt requesting a call back.

## 2021-04-23 ENCOUNTER — Encounter: Payer: Self-pay | Admitting: Internal Medicine

## 2021-04-29 NOTE — Telephone Encounter (Signed)
Pt has called back to inform that as of last night she started using her CPAP again.  This is FYI no call back requested

## 2021-05-07 DIAGNOSIS — Z9189 Other specified personal risk factors, not elsewhere classified: Secondary | ICD-10-CM | POA: Diagnosis not present

## 2021-05-07 DIAGNOSIS — Z170 Estrogen receptor positive status [ER+]: Secondary | ICD-10-CM | POA: Diagnosis not present

## 2021-05-07 DIAGNOSIS — G4733 Obstructive sleep apnea (adult) (pediatric): Secondary | ICD-10-CM | POA: Diagnosis not present

## 2021-05-07 DIAGNOSIS — C50111 Malignant neoplasm of central portion of right female breast: Secondary | ICD-10-CM | POA: Diagnosis not present

## 2021-05-07 DIAGNOSIS — Z9989 Dependence on other enabling machines and devices: Secondary | ICD-10-CM | POA: Diagnosis not present

## 2021-05-07 DIAGNOSIS — N898 Other specified noninflammatory disorders of vagina: Secondary | ICD-10-CM | POA: Diagnosis not present

## 2021-05-07 DIAGNOSIS — I10 Essential (primary) hypertension: Secondary | ICD-10-CM | POA: Diagnosis not present

## 2021-05-11 ENCOUNTER — Encounter: Payer: Self-pay | Admitting: Internal Medicine

## 2021-05-15 DIAGNOSIS — M25561 Pain in right knee: Secondary | ICD-10-CM | POA: Diagnosis not present

## 2021-05-19 DIAGNOSIS — G4733 Obstructive sleep apnea (adult) (pediatric): Secondary | ICD-10-CM | POA: Diagnosis not present

## 2021-05-28 ENCOUNTER — Encounter: Payer: Self-pay | Admitting: Internal Medicine

## 2021-05-28 ENCOUNTER — Ambulatory Visit (INDEPENDENT_AMBULATORY_CARE_PROVIDER_SITE_OTHER): Payer: Federal, State, Local not specified - PPO | Admitting: Internal Medicine

## 2021-05-28 ENCOUNTER — Other Ambulatory Visit: Payer: Self-pay

## 2021-05-28 VITALS — BP 120/74 | HR 72 | Temp 98.9°F | Ht 60.0 in | Wt 166.2 lb

## 2021-05-28 DIAGNOSIS — Z23 Encounter for immunization: Secondary | ICD-10-CM

## 2021-05-28 DIAGNOSIS — E6609 Other obesity due to excess calories: Secondary | ICD-10-CM | POA: Diagnosis not present

## 2021-05-28 DIAGNOSIS — Z6832 Body mass index (BMI) 32.0-32.9, adult: Secondary | ICD-10-CM

## 2021-05-28 DIAGNOSIS — J189 Pneumonia, unspecified organism: Secondary | ICD-10-CM | POA: Diagnosis not present

## 2021-05-28 DIAGNOSIS — M25561 Pain in right knee: Secondary | ICD-10-CM | POA: Diagnosis not present

## 2021-05-28 NOTE — Patient Instructions (Addendum)
[place], [place]  Dandelion root tea

## 2021-05-28 NOTE — Progress Notes (Signed)
Rich Brave [place]leton,acting as a Education administrator for Maximino Greenland, MD.,have documented all relevant documentation on the behalf of Maximino Greenland, MD,as directed by  Maximino Greenland, MD while in the presence of Maximino Greenland, MD.  This visit occurred during the SARS-CoV-2 public health emergency.  Safety protocols were in place, including screening questions prior to the visit, additional usage of staff PPE, and extensive cleaning of exam room while observing appropriate contact time as indicated for disinfecting solutions.  Subjective:     Patient ID: Leslie Salazar , female    DOB: 1965-10-07 , 56 y.o.   MRN: [id number]   Chief Complaint  Patient presents with   Pneumonia    HPI  Patient presents today for a f/u PNA. She was evaluated at urgent care on 04/22/21  with productive cough and congestion x 1.5 weeks. She had no relief with cough syrup/ tessalon perles from last visit 04/16/21. She notes increased discomfort on right side of chest with deep breaths. She notes increased production with color and occasional blood streaks. CXR was not performed. She was treated with azithromycin and prednisone 52m dose pack. Her sx have since resolved.   Pneumonia    Past Medical History:  Diagnosis Date   Acne    [place] 2012   RIGHT DUCTAL CARCINOMA INSITU..Marland Kitchen  Complication of anesthesia    Condyloma    ON BUTTOCKS   DCIS (ductal carcinoma in situ)    BRCA 1and 2 Neg./Pos ER, Pos PR   Fibroids    Heart palpitations    High cholesterol    History of angina    HSV-1 (herpes simplex virus 1) infection    Hx of radiation therapy 2013   from april to may - six weeks   Hypertension    Left ovarian cyst    PONV (postoperative nausea and vomiting)    nausea no vomiting   Scoliosis    Urinary urgency    Wears glasses      Family History  Problem Relation Age of Onset   Colon cancer Paternal Aunt    Diabetes Mother        HAS HAD A KIDNEY TRANSPLATN   Hypertension Mother     Kidney failure Mother    Hypertension Father    Gallstones Father    Gallstones Sister      Current Outpatient Medications:    acetaminophen (TYLENOL) 500 MG tablet, Take 500 mg by mouth every 6 (six) hours as needed., Disp: , Rfl:    aspirin EC 81 MG tablet, Take 81 mg by mouth every morning., Disp: , Rfl:    atorvastatin (LIPITOR) 20 MG tablet, Take 1 tablet (20 mg total) by mouth daily., Disp: 90 tablet, Rfl: 1   Cholecalciferol 25 MCG (1000 UT) tablet, Take 1 tablet by mouth daily. 2000u, Disp: , Rfl:    Coenzyme Q10 100 MG capsule, Take 1 capsule by mouth daily., Disp: , Rfl:    Estradiol 10 MCG TABS vaginal tablet, Place 1 tablet (10 mcg total) vaginally once a week., Disp: 12 tablet, Rfl: 4   hydrochlorothiazide (MICROZIDE) 12.5 MG capsule, Take 1 capsule (12.5 mg total) by mouth daily., Disp: 90 capsule, Rfl: 1   hydrocortisone (ANUSOL-HC) 2.5 % rectal cream, Place 1 application rectally 2 (two) times daily. (Patient taking differently: Place 1 application rectally as needed.), Disp: 30 g, Rfl: 3   loratadine (CLARITIN) 10 MG tablet, Take 10 mg by mouth every morning. , Disp: ,  Rfl:    metoprolol succinate (TOPROL-XL) 25 MG 24 hr tablet, TAKE 1 TABLET(25 MG) BY MOUTH DAILY, Disp: 90 tablet, Rfl: 1   montelukast (SINGULAIR) 10 MG tablet, TAKE 1 TABLET(10 MG) BY MOUTH DAILY, Disp: 30 tablet, Rfl: 2   Multiple Vitamins-Minerals (MULTIVITAMIN WOMENS 50+ ADV PO), Take by mouth., Disp: , Rfl:    Omega-3 Fatty Acids (FISH OIL PO), Take by mouth. 1 capsule daily, Disp: , Rfl:    traZODone (DESYREL) 50 MG tablet, Take 1 tablet (50 mg total) by mouth at bedtime as needed for sleep., Disp: 90 tablet, Rfl: 1   valACYclovir (VALTREX) 500 MG tablet, Take 1 tablet (500 mg total) by mouth daily., Disp: 90 tablet, Rfl: 4   No Known Allergies   Review of Systems  Constitutional: Negative.   Respiratory: Negative.    Cardiovascular: Negative.   Gastrointestinal: Negative.   Neurological:  Negative.   Psychiatric/Behavioral: Negative.      Today's Vitals   05/28/21 1204  BP: 120/74  Pulse: 72  Temp: 98.9 F (37.2 C)  Weight: 166 lb 3.2 oz (75.4 kg)  Height: 5' (1.524 m)  PainSc: 0-No pain   Body mass index is 32.46 kg/m.  Wt Readings from Last 3 Encounters:  05/29/21 166 lb (75.3 kg)  05/28/21 166 lb 3.2 oz (75.4 kg)  03/25/21 167 lb (75.8 kg)     Objective:  Physical Exam Vitals and nursing note reviewed.  Constitutional:      Appearance: Normal appearance.  HENT:     Head: Normocephalic and atraumatic.     Nose:     Comments: Masked     Mouth/Throat:     Comments: Masked  Eyes:     Extraocular Movements: Extraocular movements intact.  Cardiovascular:     Rate and Rhythm: Normal rate and regular rhythm.     Heart sounds: Normal heart sounds.  Pulmonary:     Effort: Pulmonary effort is normal.     Breath sounds: Normal breath sounds.  Musculoskeletal:     Cervical back: Normal range of motion.  Skin:    General: Skin is warm.  Neurological:     General: No focal deficit present.     Mental Status: She is alert.  Psychiatric:        Mood and Affect: Mood normal.        Behavior: Behavior normal.     Assessment And Plan:     1. Pneumonia due to infectious organism, unspecified laterality, unspecified part of lung Comments: Urgent care notes reviewed in full detail. Her sx have improved, no need for further testing.   2. Recurrent pain of right knee Comments: She has been evaluated by Rosanne Gutting, I will request records. She should c/w vitamin D supplementation.   3. Class 1 obesity due to excess calories with serious comorbidity and body mass index (BMI) of 32.0 to 32.9 in adult Comments: She is encouraged to aim for BMI<30 to decrease cardiac risk. Advised to gradually incorporate at leas t150 minutes of exercise per week.   4. Immunization due Comments: She was given Shingrix IM x 1.  - Varicella-zoster vaccine IM (Shingrix)   Patient  was given opportunity to ask questions. Patient verbalized understanding of the plan and was able to repeat key elements of the plan. All questions were answered to their satisfaction.   I, Maximino Greenland, MD, have reviewed all documentation for this visit. The documentation on 05/30/21 for the exam, diagnosis, procedures, and orders are all  accurate and complete.   IF YOU HAVE BEEN REFERRED TO A SPECIALIST, IT MAY TAKE 1-2 WEEKS TO SCHEDULE/PROCESS THE REFERRAL. IF YOU HAVE NOT HEARD FROM US/SPECIALIST IN TWO WEEKS, PLEASE GIVE [place] A CALL AT [phone].   THE PATIENT IS ENCOURAGED TO PRACTICE SOCIAL DISTANCING DUE TO THE COVID-19 PANDEMIC.

## 2021-05-29 ENCOUNTER — Ambulatory Visit (INDEPENDENT_AMBULATORY_CARE_PROVIDER_SITE_OTHER): Payer: Federal, State, Local not specified - PPO | Admitting: Adult Health

## 2021-05-29 VITALS — BP 127/85 | HR 78 | Ht 60.0 in | Wt 166.0 lb

## 2021-05-29 DIAGNOSIS — Z9989 Dependence on other enabling machines and devices: Secondary | ICD-10-CM | POA: Diagnosis not present

## 2021-05-29 DIAGNOSIS — G4733 Obstructive sleep apnea (adult) (pediatric): Secondary | ICD-10-CM

## 2021-05-29 NOTE — Progress Notes (Signed)
PATIENT: Leslie Salazar DOB: 30-May-1965  REASON FOR VISIT: follow up HISTORY FROM: patient PRIMARY NEUROLOGIST: Dr. Brett Fairy  HISTORY OF PRESENT ILLNESS: Today 05/29/21:  Leslie Salazar is a 56 year old female with a history of obstructive sleep apnea on CPAP.  This is her initial compliance visit.  She reports that she has noticed some benefit from using the CPAP.  Still sleepy during the day specifically if she watches television.  She states that she finds that her mask leaks and it takes her a while to get it fitted appropriately.  Her download is below    HISTORY (Copied from Dr.Dohmeier's note) I have the pleasure of seeing Leslie Salazar today, a right -handed Black or [place] American female with a possible sleep disorder.  She has a  has a past medical history of Acne, breast Cancer ([place]) (2012), mastectomy bilateral- 04/2011. Complication of anesthesia, Condyloma, DCIS (ductal carcinoma in situ), Fibroids, Heart palpitations, High cholesterol, History of angina, hx of radiation therapy (2013), Hypertension, Left ovarian cyst, Scoliosis, Urinary urgency, and non restorative sleep. Ovariectomy, 2021-Medical menopause, 6.5 years on tamoxifen. Weight gain.      Sleep relevant medical history: Nocturia 2- more,seasonal allergic rhinitis.  post nasal drip.   Family medical /sleep history: no other family member on CPAP with OSA, neither insomnia. .    Social history:  Patient is working as a Actuary of [employer] and lives in a household with spouse. Family status is married , with children , adult son is 48, not living at home. No pets.  The patient currently works daytime, partly of from home . Tobacco use/.  ETOH use 2/ month , Caffeine intake in form of Coffee( 1 cup in AM ) , routinely GYM visits, private trainer.  Hobbies : travelling, walking.        Sleep habits are as follows: The patient's dinner time is between 6 PM. The patient goes to bed at 10 PM and  often struggles to go to sleep before midnight- continues to sleep for 2 hours, wakes for 1-2 bathroom breaks, the first time at 2.30 AM.  The bedroom is quit and cool, and daylight penetrates the room in the morning.  The preferred sleep position is side and prone , with the support of 2 pillows.  Dreams are reportedly frequent.  6-7  AM is the usual rise time. The patient wakes up with an alarm.  She reports not feeling refreshed or restored in AM, with symptoms such as  tingling  in the right hand- dry mouth, morning headaches, and residual fatigue.  Naps are taken infrequently, lasting from 15 to 30 minutes and are more refreshing than nocturnal sleep.   REVIEW OF SYSTEMS: Out of a complete 14 system review of symptoms, the patient complains only of the following symptoms, and all other reviewed systems are negative.  FSS 39 ESS 16  ALLERGIES: No Known Allergies  HOME MEDICATIONS: Outpatient Medications Prior to Visit  Medication Sig Dispense Refill   acetaminophen (TYLENOL) 500 MG tablet Take 500 mg by mouth every 6 (six) hours as needed.     aspirin EC 81 MG tablet Take 81 mg by mouth every morning.     atorvastatin (LIPITOR) 20 MG tablet Take 1 tablet (20 mg total) by mouth daily. 90 tablet 1   Cholecalciferol 25 MCG (1000 UT) tablet Take 1 tablet by mouth daily. 2000u     Coenzyme Q10 100 MG capsule Take 1 capsule by mouth daily.  Estradiol 10 MCG TABS vaginal tablet Place 1 tablet (10 mcg total) vaginally once a week. 12 tablet 4   hydrochlorothiazide (MICROZIDE) 12.5 MG capsule Take 1 capsule (12.5 mg total) by mouth daily. 90 capsule 1   hydrocortisone (ANUSOL-HC) 2.5 % rectal cream Place 1 application rectally 2 (two) times daily. (Patient taking differently: Place 1 application rectally as needed.) 30 g 3   loratadine (CLARITIN) 10 MG tablet Take 10 mg by mouth every morning.      metoprolol succinate (TOPROL-XL) 25 MG 24 hr tablet TAKE 1 TABLET(25 MG) BY MOUTH DAILY 90  tablet 1   montelukast (SINGULAIR) 10 MG tablet TAKE 1 TABLET(10 MG) BY MOUTH DAILY 30 tablet 2   Multiple Vitamins-Minerals (MULTIVITAMIN WOMENS 50+ ADV PO) Take by mouth.     Omega-3 Fatty Acids (FISH OIL PO) Take by mouth. 1 capsule daily     traZODone (DESYREL) 50 MG tablet Take 1 tablet (50 mg total) by mouth at bedtime as needed for sleep. 90 tablet 1   valACYclovir (VALTREX) 500 MG tablet Take 1 tablet (500 mg total) by mouth daily. 90 tablet 4   No facility-administered medications prior to visit.    PAST MEDICAL HISTORY: Past Medical History:  Diagnosis Date   Acne    Cancer ([place]) 2012   RIGHT DUCTAL CARCINOMA INSITU.Marland Kitchen   Complication of anesthesia    Condyloma    ON BUTTOCKS   DCIS (ductal carcinoma in situ)    BRCA 1and 2 Neg./Pos ER, Pos PR   Fibroids    Heart palpitations    High cholesterol    History of angina    HSV-1 (herpes simplex virus 1) infection    Hx of radiation therapy 2013   from april to may - six weeks   Hypertension    Left ovarian cyst    PONV (postoperative nausea and vomiting)    nausea no vomiting   Scoliosis    Urinary urgency    Wears glasses     PAST SURGICAL HISTORY: Past Surgical History:  Procedure Laterality Date   BREAST CAPSULECTOMY WITH IMPLANT EXCHANGE Right 06/27/2019   Procedure: release of capsular contracture with repositioning of the right implant with possible exchange of right breast implant;  Surgeon: Wallace Going, DO;  Location: [facility];  Service: Plastics;  Laterality: Right;   BREAST RECONSTRUCTION WITH PLACEMENT OF TISSUE EXPANDER AND FLEX HD (ACELLULAR HYDRATED DERMIS) Right 11/16/2012   Procedure: RIGHT LATISSIMUS myocutaneous FLAP WITH PLACEMENT OF TISSUE EXPANDER  TO RIGHT BREAST;  Surgeon: Theodoro Kos, DO;  Location: [facility];  Service: Plastics;  Laterality: Right;   CARDIAC CATHETERIZATION N/A 02/28/2015   Procedure: Left Heart Cath and Coronary Angiography;  Surgeon: Adrian Prows, MD;   Location: [facility];  Service: Cardiovascular;  Laterality: N/A;   CESAREAN SECTION  2000   ENDOMETRIAL ABLATION W/ NOVASURE  2007   INSERTION OF TISSUE EXPANDER AFTER MASTECTOMY Bilateral 04/20/2011   Breast Ca (r) T1b N0 M0 stage IA infiltrating ductal carcinoma   LAPAROSCOPIC BILATERAL SALPINGO OOPHERECTOMY Bilateral 08/30/2018   Procedure: LAPAROSCOPIC BILATERAL SALPINGO OOPHORECTOMY w/ PERITONEAL WASHINGS;  Surgeon: Princess Bruins, MD;  Location: [facility];  Service: Gynecology;  Laterality: Bilateral;   LASER ABLATION OF THE CERVIX  1994   FOR DYSPLASIA   LIPOSUCTION WITH LIPOFILLING Right 03/14/2013   Procedure: LIPOSUCTION WITH LIPOFILLING;  Surgeon: Theodoro Kos, DO;  Location: [facility];  Service: Plastics;  Laterality: Right;  LIPOSUCTION WITH LIPOFILLING Right 06/27/2019   Procedure: Fat grafting to right breast with excision of back excess skin;  Surgeon: Wallace Going, DO;  Location: [facility];  Service: Plastics;  Laterality: Right;  2 hours, please   RECONSTRUCTION BREAST W/ LATISSIMUS DORSI FLAP Right 11/16/2012   REMOVAL OF TISSUE EXPANDER AND PLACEMENT OF IMPLANT Right 03/14/2013   Procedure: REMOVAL OF RIGHT BREAST TISSUE EXPANDER WITH PLACEMENT OF RIGHT BREAST IMPLANT;  Surgeon: Theodoro Kos, DO;  Location: [facility];  Service: Plastics;  Laterality: Right;  Removal of Right Breast Tissue Expander with Placement of Right Breast Implant, Possible Lipsuction with Lipofilling   TISSUE EXPANDER PLACEMENT Right 11/16/2012   TISSUE EXPANDER REMOVAL Bilateral 12/2011   TUBAL LIGATION  09/08/2001   BY LAPAROSCOPY ,, HULKA CLIPS TECHNIQUE   WISDOM TOOTH EXTRACTION      FAMILY HISTORY: Family History  Problem Relation Age of Onset   Colon cancer Paternal Aunt    Diabetes Mother        HAS HAD A KIDNEY TRANSPLATN   Hypertension Mother    Kidney failure Mother    Hypertension Father    Gallstones  Father    Gallstones Sister     SOCIAL HISTORY: Social History   Socioeconomic History   Marital status: Legally Separated    Spouse name: Not on file   Number of children: 1   Years of education: Not on file   Highest education level: Master's degree (e.g., MA, MS, MEng, MEd, MSW, MBA)  Occupational History   Not on file  Tobacco Use   Smoking status: Never   Smokeless tobacco: Never  Vaping Use   Vaping Use: Never used  Substance and Sexual Activity   Alcohol use: Yes    Alcohol/week: 1.0 standard drink    Types: 1 Glasses of wine per week   Drug use: No   Sexual activity: Not Currently    Birth control/protection: Surgical, Condom    Comment: 1ST INTERCOURSE- 32, PARTNERS- 4  Other Topics Concern   Not on file  Social History Narrative   Lives with husband   Social Determinants of Health   Financial Resource Strain: Not on file  Food Insecurity: Not on file  Transportation Needs: Not on file  Physical Activity: Not on file  Stress: Not on file  Social Connections: Not on file  Intimate Partner Violence: Not on file      PHYSICAL EXAM  Vitals:   05/29/21 1123  BP: 127/85  Pulse: 78  Weight: 166 lb (75.3 kg)  Height: 5' (1.524 m)   Body mass index is 32.42 kg/m.  Generalized: Well developed, in no acute distress  Chest: Lungs clear to auscultation bilaterally  Neurological examination  Mentation: Alert oriented to time, place, history taking. Follows all commands speech and language fluent Cranial nerve II-XII: Extraocular movements were full, visual field were full on confrontational test Head turning and shoulder shrug  were normal and symmetric. Motor: The motor testing reveals 5 over 5 strength of all 4 extremities. Good symmetric motor tone is noted throughout.  Sensory: Sensory testing is intact to soft touch on all 4 extremities. No evidence of extinction is noted.  Gait and station: Gait is normal.    DIAGNOSTIC DATA (LABS, IMAGING,  TESTING) - I reviewed patient records, labs, notes, testing and imaging myself where available.  Lab Results  Component Value Date   WBC 6.8 09/10/2020   HGB 14.7 09/10/2020   HCT 43.7  09/10/2020   MCV 89 09/10/2020   PLT 270 09/10/2020      Component Value Date/Time   NA 142 03/25/2021 1456   K 4.1 03/25/2021 1456   CL 101 03/25/2021 1456   CO2 24 03/25/2021 1456   GLUCOSE 82 03/25/2021 1456   GLUCOSE 96 06/22/2019 0936   BUN 18 03/25/2021 1456   CREATININE 0.81 03/25/2021 1456   CALCIUM 9.8 03/25/2021 1456   PROT 7.4 03/25/2021 1456   ALBUMIN 4.9 03/25/2021 1456   AST 16 03/25/2021 1456   ALT 21 03/25/2021 1456   ALKPHOS 83 03/25/2021 1456   BILITOT 0.7 03/25/2021 1456   GFRNONAA 78 03/12/2020 1528   GFRAA 90 03/12/2020 1528   Lab Results  Component Value Date   CHOL 191 09/10/2020   HDL 73 09/10/2020   LDLCALC 96 09/10/2020   TRIG 125 09/10/2020   CHOLHDL 2.6 09/10/2020   Lab Results  Component Value Date   HGBA1C 5.4 03/25/2021   Lab Results  Component Value Date   VITAMINB12 1,118 06/17/2020   Lab Results  Component Value Date   TSH 1.150 03/12/2020      ASSESSMENT AND PLAN 56 y.o. year old female  has a past medical history of Acne, Cancer ([place]) (8737), Complication of anesthesia, Condyloma, DCIS (ductal carcinoma in situ), Fibroids, Heart palpitations, High cholesterol, History of angina, HSV-1 (herpes simplex virus 1) infection, radiation therapy (2013), Hypertension, Left ovarian cyst, PONV (postoperative nausea and vomiting), Scoliosis, Urinary urgency, and Wears glasses. here with:  OSA on CPAP  - CPAP compliance excellent - Good treatment of AHI  -Order sent for mask refitting - Encourage patient to use CPAP nightly and > 4 hours each night - F/U in 1 year or sooner if needed  Ward Givens, MSN, NP-C 05/29/2021, 11:20 AM [facility] [address] [place], [place] [address] [phone]

## 2021-05-29 NOTE — Patient Instructions (Signed)
Continue using CPAP nightly and greater than 4 hours each night °If your symptoms worsen or you develop new symptoms please let us know.  ° °

## 2021-06-01 ENCOUNTER — Ambulatory Visit: Payer: Federal, State, Local not specified - PPO | Admitting: Adult Health

## 2021-06-16 DIAGNOSIS — G4733 Obstructive sleep apnea (adult) (pediatric): Secondary | ICD-10-CM | POA: Diagnosis not present

## 2021-06-23 ENCOUNTER — Other Ambulatory Visit: Payer: Self-pay | Admitting: Internal Medicine

## 2021-07-17 DIAGNOSIS — G4733 Obstructive sleep apnea (adult) (pediatric): Secondary | ICD-10-CM | POA: Diagnosis not present

## 2021-08-16 DIAGNOSIS — G4733 Obstructive sleep apnea (adult) (pediatric): Secondary | ICD-10-CM | POA: Diagnosis not present

## 2021-09-03 ENCOUNTER — Telehealth: Payer: Self-pay | Admitting: Adult Health

## 2021-09-03 ENCOUNTER — Encounter: Payer: Self-pay | Admitting: *Deleted

## 2021-09-03 NOTE — Telephone Encounter (Signed)
Spoke to pt and she will be traveling outside country to [place] 6/06-2021 and is needing letter stating the her CPAP is medically necessary.  I produced letter and printed.  To MM/NP on return to sign then will email to pt.  Pt verbalized understanding will be next week.

## 2021-09-03 NOTE — Telephone Encounter (Signed)
Pt said going out of country. Requesting a medical letter with reason to use CPAP machine due to sleep apnea. Would like a call from the nurse.

## 2021-09-06 ENCOUNTER — Encounter: Payer: Self-pay | Admitting: Internal Medicine

## 2021-09-08 ENCOUNTER — Encounter: Payer: Self-pay | Admitting: Internal Medicine

## 2021-09-09 ENCOUNTER — Other Ambulatory Visit (HOSPITAL_COMMUNITY)
Admission: RE | Admit: 2021-09-09 | Discharge: 2021-09-09 | Disposition: A | Discharge status: Home / Self Care | Payer: Federal, State, Local not specified - PPO | Source: Ambulatory Visit | Attending: Obstetrics & Gynecology | Admitting: Obstetrics & Gynecology

## 2021-09-09 ENCOUNTER — Encounter: Payer: Self-pay | Admitting: Obstetrics & Gynecology

## 2021-09-09 ENCOUNTER — Ambulatory Visit (INDEPENDENT_AMBULATORY_CARE_PROVIDER_SITE_OTHER): Payer: Federal, State, Local not specified - PPO | Admitting: Obstetrics & Gynecology

## 2021-09-09 VITALS — BP 118/76 | HR 62 | Resp 16 | Ht 62.25 in | Wt 159.0 lb

## 2021-09-09 DIAGNOSIS — Z90722 Acquired absence of ovaries, bilateral: Secondary | ICD-10-CM

## 2021-09-09 DIAGNOSIS — C50911 Malignant neoplasm of unspecified site of right female breast: Secondary | ICD-10-CM

## 2021-09-09 DIAGNOSIS — Z13820 Encounter for screening for osteoporosis: Secondary | ICD-10-CM

## 2021-09-09 DIAGNOSIS — N952 Postmenopausal atrophic vaginitis: Secondary | ICD-10-CM | POA: Diagnosis not present

## 2021-09-09 DIAGNOSIS — A630 Anogenital (venereal) warts: Secondary | ICD-10-CM

## 2021-09-09 DIAGNOSIS — Z170 Estrogen receptor positive status [ER+]: Secondary | ICD-10-CM

## 2021-09-09 DIAGNOSIS — Z780 Asymptomatic menopausal state: Secondary | ICD-10-CM | POA: Diagnosis not present

## 2021-09-09 DIAGNOSIS — Z01419 Encounter for gynecological examination (general) (routine) without abnormal findings: Secondary | ICD-10-CM

## 2021-09-09 DIAGNOSIS — Z8619 Personal history of other infectious and parasitic diseases: Secondary | ICD-10-CM

## 2021-09-09 MED ORDER — VALACYCLOVIR HCL 500 MG PO TABS: 500.0000 mg | ORAL_TABLET | Freq: Every day | ORAL | 4 refills | Status: DC

## 2021-09-09 MED ORDER — ESTRADIOL 10 MCG VA TABS: 1.0000 | ORAL_TABLET | VAGINAL | 4 refills | Status: DC

## 2021-09-09 NOTE — Telephone Encounter (Signed)
Emailed letter to pt at her address ([email or web address]).

## 2021-09-09 NOTE — Progress Notes (Signed)
Leslie Salazar [facility] 17-Oct-1965 [id number]   History:    56 y.o. G1P1L1  Married, now separated.   RP:  Established patient presenting for annual gyn exam    HPI:  Postmenopause, well on no HRT, no PMB.  No pelvic pain.  S/P BSO 08/2018.  Patho benign.  Rt Breast Infiltrating Ductal Ca, ER-PR positive.  Finished Tamoxifen >3 yrs ago.  BrCa1-2 negative. Had bilateral mastectomy/breast reconstruction with tattoos.  Bilateral Breast US in 05/2019.  Using Vagifem and Vit E in vagina to help with IC. Pap Neg 08/2020.  Pap reflex today.  Pt c/o vaginal dryness & wart on buttocks.  Has been treated with Aldara for perianal wart in the past.  Valtrex prophylaxis.  BMI improved to 28.85.  Gym with trainer 2 times a week and walking 5-7 times a week.  Health labs with Fam MD.  [name] 2017. BMD normal on 06-08-16.  Will schedule a repeat BD here now.  Past medical history,surgical history, family history and social history were all reviewed and documented in the EPIC chart.  Gynecologic History No LMP recorded. Patient is postmenopausal.  Obstetric History OB History  Gravida Para Term Preterm AB Living  _0 SAB IAB Ectopic Multiple Live Births          1    # Outcome Date GA Lbr Len/2nd Weight Sex Delivery Anes PTL Lv  1 Term     M CS-Unspec  N LIV     ROS: A ROS was performed and pertinent positives and negatives are included in the history. GENERAL: No fevers or chills. HEENT: No change in vision, no earache, sore throat or sinus congestion. NECK: No pain or stiffness. CARDIOVASCULAR: No chest pain or pressure. No palpitations. PULMONARY: No shortness of breath, cough or wheeze. GASTROINTESTINAL: No abdominal pain, nausea, vomiting or diarrhea, melena or bright red blood per rectum. GENITOURINARY: No urinary frequency, urgency, hesitancy or dysuria. MUSCULOSKELETAL: No joint or muscle pain, no back pain, no recent trauma. DERMATOLOGIC: No rash, no itching, no lesions. ENDOCRINE: No polyuria,  polydipsia, no heat or cold intolerance. No recent change in weight. HEMATOLOGICAL: No anemia or easy bruising or bleeding. NEUROLOGIC: No headache, seizures, numbness, tingling or weakness. PSYCHIATRIC: No depression, no loss of interest in normal activity or change in sleep pattern.     Exam:   BP 118/76   Pulse 62   Resp 16   Ht 5' 2.25" (1.581 m)   Wt 159 lb (72.1 kg)   BMI 28.85 kg/m   Body mass index is 28.85 kg/m.  General appearance : Well developed well nourished female. No acute distress HEENT: Eyes: no retinal hemorrhage or exudates,  Neck supple, trachea midline, no carotid bruits, no thyroidmegaly Lungs: Clear to auscultation, no rhonchi or wheezes, or rib retractions  Heart: Regular rate and rhythm, no murmurs or gallops Breast:Examined in sitting and supine position were symmetrical in appearance, no palpable masses or tenderness,  no skin retraction, no nipple inversion, no nipple discharge, no skin discoloration, no axillary or supraclavicular lymphadenopathy Abdomen: no palpable masses or tenderness, no rebound or guarding Extremities: no edema or skin discoloration or tenderness  Pelvic: Vulva: Normal             Vagina: No gross lesions or discharge  Cervix: No gross lesions or discharge.  Pap reflex done.  Uterus  Very AV, normal size, shape and consistency, non-tender and mobile  Adnexa  Without masses  or tenderness  Anus: Normal.  Condyloma between buttocks.   Assessment/Plan:  56 y.o. female for annual exam   1. Encounter for routine gynecological examination with Papanicolaou smear of cervix Postmenopause, well on no HRT, no PMB.  No pelvic pain.  S/P BSO 08/2018.  Patho benign.  Rt Breast Infiltrating Ductal Ca, ER-PR positive.  Finished Tamoxifen >3 yrs ago.  BrCa1-2 negative. Had bilateral mastectomy/breast reconstruction with tattoos.  Bilateral Breast US in 05/2019.  Using Vagifem and Vit E in vagina to help with IC. Pap Neg 08/2020.  Pap reflex today.   Pt c/o vaginal dryness & wart on buttocks.  Has been treated with Aldara for perianal wart in the past.  Valtrex prophylaxis.  BMI improved to 28.85.  Gym with trainer 2 times a week and walking 5-7 times a week.  Health labs with Fam MD.  [name] 2017. BMD normal on 06-08-16.  Will schedule a repeat BD here now. - Cytology - PAP( [facility])  2. S/P BSO (bilateral salpingo-oophorectomy)  3. Postmenopausal Postmenopause, well on no HRT, no PMB.  No pelvic pain.  S/P BSO 08/2018.  Patho benign.   - DG Bone Density; Future  4. Postmenopausal atrophic vaginitis Better on Vagifem once a week.  Desires to continue.  Prescription sent to pharmacy.  5. Screening for osteoporosis Last BD in 2018 was normal.  Will repeat a BD here now. - DG Bone Density; Future  6. Condyloma acuminatum in female F/U Freezing or Excision of condyloma at the buttocks.   7. H/O herpes genitalis Continue prophylaxis with Valacyclovir.  Prescription sent to pharmacy.  8. Infiltrating ductal carcinoma of right breast, estrogen receptor positive, stage 1 (HCC) S/P Bilateral Mastectomy.  Other orders - imiquimod (ALDARA) 5 % cream; Apply topically 2 (two) times a week. - Estradiol 10 MCG TABS vaginal tablet; Place 1 tablet (10 mcg total) vaginally once a week. - valACYclovir (VALTREX) 500 MG tablet; Take 1 tablet (500 mg total) by mouth daily.   Princess Bruins MD, 10:52 AM 09/09/2021

## 2021-09-10 ENCOUNTER — Telehealth: Payer: Self-pay

## 2021-09-10 LAB — CYTOLOGY - PAP
Adequacy: ABSENT
Diagnosis: NEGATIVE

## 2021-09-10 NOTE — Telephone Encounter (Signed)
I called and left pt vm to inform her that her letter has been completed and to give me a call back and let me know If she wants me to email it or if she wants to pick it up. YL,RMA

## 2021-09-16 DIAGNOSIS — G4733 Obstructive sleep apnea (adult) (pediatric): Secondary | ICD-10-CM | POA: Diagnosis not present

## 2021-09-21 ENCOUNTER — Encounter: Payer: Federal, State, Local not specified - PPO | Admitting: Internal Medicine

## 2021-09-23 ENCOUNTER — Telehealth: Payer: Federal, State, Local not specified - PPO | Admitting: Physician Assistant

## 2021-09-23 DIAGNOSIS — A09 Infectious gastroenteritis and colitis, unspecified: Secondary | ICD-10-CM | POA: Diagnosis not present

## 2021-09-23 DIAGNOSIS — I10 Essential (primary) hypertension: Secondary | ICD-10-CM | POA: Diagnosis not present

## 2021-09-23 NOTE — Progress Notes (Signed)
Virtual Visit Consent   Leslie Salazar, you are scheduled for a virtual visit with a [facility] provider today. Just as with appointments in the office, your consent must be obtained to participate. Your consent will be active for this visit and any virtual visit you may have with one of our providers in the next 365 days. If you have a MyChart account, a copy of this consent can be sent to you electronically.  As this is a virtual visit, video technology does not allow for your provider to perform a traditional examination. This may limit your provider's ability to fully assess your condition. If your provider identifies any concerns that need to be evaluated in person or the need to arrange testing (such as labs, EKG, etc.), we will make arrangements to do so. Although advances in technology are sophisticated, we cannot ensure that it will always work on either your end or our end. If the connection with a video visit is poor, the visit may have to be switched to a telephone visit. With either a video or telephone visit, we are not always able to ensure that we have a secure connection.  By engaging in this virtual visit, you consent to the provision of healthcare and authorize for your insurance to be billed (if applicable) for the services provided during this visit. Depending on your insurance coverage, you may receive a charge related to this service.  I need to obtain your verbal consent now. Are you willing to proceed with your visit today? Leslie Salazar has provided verbal consent on 09/23/2021 for a virtual visit (video or telephone). Leeanne Rio, [place]  Date: 09/23/2021 9:37 AM  Virtual Visit via Video Note   I, Leeanne Rio, connected with  Leslie Salazar  ([id number], 07/13/1965) on 09/23/21 at  9:15 AM EDT by a video-enabled telemedicine application and verified that I am speaking with the correct person using two identifiers.  Location: Patient: Virtual Visit  Location Patient: Home Provider: Virtual Visit Location Provider: Home Office   I discussed the limitations of evaluation and management by telemedicine and the availability of in person appointments. The patient expressed understanding and agreed to proceed.    History of Present Illness: Leslie Salazar is a 56 y.o. who identifies as a female who was assigned female at birth, and is being seen today for elevated BP readings over the past few days. Patient with history of HTN, currently on a regimen of HCTZ 12.5 mg daily and Metoprolol XL 25 mg daily. Endorses being well-controlled on this regimen for some time. Recently got back from a trip to [place] where she ended up suffering from traveler's diarrhea. Notes due to some diarrhea and nausea she withheld her medications From Saturday until yesterday, restarting her BP medications. Notes that today she checked her BP 3 separate times with the following readings:  6:00 AM 175/97 8:00 AM 165/103 9:00 AM 165/97   Notes taking her medication at 6 AM after checking her BP the first time.  Patient denies chest pain, palpitations, lightheadedness, dizziness, vision changes or frequent headaches. Denies melena or hematochezia. Denies any current abdominal pain. Bowels have normalized.   HPI: HPI  Problems:  Patient Active Problem List   Diagnosis Date Noted   Essential hypertension, benign 03/25/2021   OSA on CPAP 03/25/2021   Weight gain 03/25/2021   Sleep related headaches 01/28/2021   Intermittent palpitations 01/28/2021   Symptoms, such as flushing, sleeplessness, headache, lack of concentration,  associated with the menopause 11/27/2020   Excessive daytime sleepiness 11/27/2020   Numbness and tingling in right hand 11/27/2020   Snoring 11/27/2020   Breast asymmetry following reconstructive surgery 05/01/2019   Ovarian cyst, left 10/20/2015   Premature ovarian failure 05/08/2014   Acquired absence of breast and absent nipple 11/16/2012    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   Menopausal and perimenopausal disorder 12/07/2010   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    Allergies: No Known Allergies Medications:  Current Outpatient Medications:    aspirin EC 81 MG tablet, Take 81 mg by mouth every morning., Disp: , Rfl:    atorvastatin (LIPITOR) 20 MG tablet, Take 1 tablet (20 mg total) by mouth daily., Disp: 90 tablet, Rfl: 1   Cholecalciferol 25 MCG (1000 UT) tablet, Take 1 tablet by mouth daily., Disp: , Rfl:    Coenzyme Q10 100 MG capsule, Take 1 capsule by mouth daily., Disp: , Rfl:    Estradiol 10 MCG TABS vaginal tablet, Place 1 tablet (10 mcg total) vaginally once a week., Disp: 12 tablet, Rfl: 4   hydrochlorothiazide (MICROZIDE) 12.5 MG capsule, Take 1 capsule (12.5 mg total) by mouth daily., Disp: 90 capsule, Rfl: 1   hydrocortisone (ANUSOL-HC) 2.5 % rectal cream, Place 1 application rectally 2 (two) times daily. (Patient taking differently: Place 1 application. rectally as needed.), Disp: 30 g, Rfl: 3   imiquimod (ALDARA) 5 % cream, Apply topically 2 (two) times a week., Disp: , Rfl:    loratadine (CLARITIN) 10 MG tablet, Take 10 mg by mouth every morning. , Disp: , Rfl:    metoprolol succinate (TOPROL-XL) 25 MG 24 hr tablet, TAKE 1 TABLET(25 MG) BY MOUTH DAILY, Disp: 90 tablet, Rfl: 1   montelukast (SINGULAIR) 10 MG tablet, TAKE 1 TABLET(10 MG) BY MOUTH DAILY, Disp: 30 tablet, Rfl: 2   Multiple Vitamins-Minerals (MULTIVITAMIN WOMENS 50+ ADV PO), Take by mouth., Disp: , Rfl:    Omega-3 Fatty Acids (FISH OIL PO), Take by mouth. 1 capsule daily, Disp: , Rfl:    traZODone (DESYREL) 50 MG tablet, Take 1 tablet (50 mg total) by mouth at bedtime as needed for sleep., Disp: 90 tablet, Rfl: 1   valACYclovir (VALTREX) 500 MG tablet, Take 1 tablet (500 mg total) by mouth daily., Disp: 90 tablet, Rfl: 4  Observations/Objective: Patient is well-developed, well-nourished in no acute distress.  Resting comfortably at home.  Head is  normocephalic, atraumatic.  No labored breathing. Speech is clear and coherent with logical content.  Patient is alert and oriented at baseline.   Assessment and Plan: 1. Essential hypertension, benign  BP previously under good control. Off of medication for a few days. This coupled with stress on body from recent illness is likely culprit for elevated BP readings. Initial BP this morning was the highest. Checking later after taking medications systolic levels dropping some. She is asymptomatic. Will have her start DASH diet, hydrate and rest. Continue current dose of medications and repeat BP this afternoon and tomorrow morning. If not continuing to trend downward to her baseline or if climbing or any symptoms developing -- headache, lightheadedness, nosebleed -- she needs to be evaluated in person with PCP or UC. ER for chest pain, palpitations. LH/DZ. She needs to contact her PCP to schedule a follow-up to reassess BP and make further adjustments to her medications if indicated.   2. Traveler's diarrhea  Resolved. Is hydrating and eating well. She had stopped diuretic while having diarrhea but now has resumed post resolution of  GI symptoms.   Follow Up Instructions: I discussed the assessment and treatment plan with the patient. The patient was provided an opportunity to ask questions and all were answered. The patient agreed with the plan and demonstrated an understanding of the instructions.  A copy of instructions were sent to the patient via MyChart unless otherwise noted below.   The patient was advised to call back or seek an in-person evaluation if the symptoms worsen or if the condition fails to improve as anticipated.  Time:  I spent 10 minutes with the patient via telehealth technology discussing the above problems/concerns.    Leeanne Rio, PA-C

## 2021-09-23 NOTE — Patient Instructions (Signed)
[name], thank you for joining Leeanne Rio, PA-C for today's virtual visit.  While this provider is not your primary care provider (PCP), if your PCP is located in our provider database this encounter information will be shared with them immediately following your visit.  Consent: (Patient) Leslie Salazar provided verbal consent for this virtual visit at the beginning of the encounter.  Current Medications:  Current Outpatient Medications:    aspirin EC 81 MG tablet, Take 81 mg by mouth every morning., Disp: , Rfl:    atorvastatin (LIPITOR) 20 MG tablet, Take 1 tablet (20 mg total) by mouth daily., Disp: 90 tablet, Rfl: 1   Cholecalciferol 25 MCG (1000 UT) tablet, Take 1 tablet by mouth daily., Disp: , Rfl:    Coenzyme Q10 100 MG capsule, Take 1 capsule by mouth daily., Disp: , Rfl:    Estradiol 10 MCG TABS vaginal tablet, Place 1 tablet (10 mcg total) vaginally once a week., Disp: 12 tablet, Rfl: 4   hydrochlorothiazide (MICROZIDE) 12.5 MG capsule, Take 1 capsule (12.5 mg total) by mouth daily., Disp: 90 capsule, Rfl: 1   hydrocortisone (ANUSOL-HC) 2.5 % rectal cream, Place 1 application rectally 2 (two) times daily. (Patient taking differently: Place 1 application. rectally as needed.), Disp: 30 g, Rfl: 3   imiquimod (ALDARA) 5 % cream, Apply topically 2 (two) times a week., Disp: , Rfl:    loratadine (CLARITIN) 10 MG tablet, Take 10 mg by mouth every morning. , Disp: , Rfl:    metoprolol succinate (TOPROL-XL) 25 MG 24 hr tablet, TAKE 1 TABLET(25 MG) BY MOUTH DAILY, Disp: 90 tablet, Rfl: 1   montelukast (SINGULAIR) 10 MG tablet, TAKE 1 TABLET(10 MG) BY MOUTH DAILY, Disp: 30 tablet, Rfl: 2   Multiple Vitamins-Minerals (MULTIVITAMIN WOMENS 50+ ADV PO), Take by mouth., Disp: , Rfl:    Omega-3 Fatty Acids (FISH OIL PO), Take by mouth. 1 capsule daily, Disp: , Rfl:    traZODone (DESYREL) 50 MG tablet, Take 1 tablet (50 mg total) by mouth at bedtime as needed for sleep., Disp:  90 tablet, Rfl: 1   valACYclovir (VALTREX) 500 MG tablet, Take 1 tablet (500 mg total) by mouth daily., Disp: 90 tablet, Rfl: 4   Medications ordered in this encounter:  No orders of the defined types were placed in this encounter.    *If you need refills on other medications prior to your next appointment, please contact your pharmacy*  Follow-Up: Call back or seek an in-person evaluation if the symptoms worsen or if the condition fails to improve as anticipated.  Other Instructions Please keep well-hydrated and get plenty of rest. Follow the dietary recommendations below. Avoid NSAIDs (Ibuprofen -- Advil, Motrin, Naproxen -- Aleve, or any Goody/BC powders) as this can upset stomach and increase BP.  Continue current BP medication regimen.  BP levels should continue to trend down over the next 24 hours. If not or climbing, I want you to be evaluated in person with your PCP or at a local Urgent Care. If you note any chest pain, bad headache, dizziness, shortness of breath or racing heart -- ER evaluation. DO NOT DELAY CARE.  DASH Eating Plan DASH stands for Dietary Approaches to Stop Hypertension. The DASH eating plan is a healthy eating plan that has been shown to: Reduce high blood pressure (hypertension). Reduce your risk for type 2 diabetes, heart disease, and stroke. Help with weight loss. What are tips for following this plan? Reading food labels Check food labels  for the amount of salt (sodium) per serving. Choose foods with less than 5 percent of the Daily Value of sodium. Generally, foods with less than 300 milligrams (mg) of sodium per serving fit into this eating plan. To find whole grains, look for the word "whole" as the first word in the ingredient list. Shopping Buy products labeled as "low-sodium" or "no salt added." Buy fresh foods. Avoid canned foods and pre-made or frozen meals. Cooking Avoid adding salt when cooking. Use salt-free seasonings or herbs instead of  table salt or sea salt. Check with your health care provider or pharmacist before using salt substitutes. Do not fry foods. Cook foods using healthy methods such as baking, boiling, grilling, roasting, and broiling instead. Cook with heart-healthy oils, such as olive, canola, avocado, soybean, or sunflower oil. Meal planning  Eat a balanced diet that includes: 4 or more servings of fruits and 4 or more servings of vegetables each day. Try to fill one-half of your plate with fruits and vegetables. 6-8 servings of whole grains each day. Less than 6 oz (170 g) of lean meat, poultry, or fish each day. A 3-oz (85-g) serving of meat is about the same size as a deck of cards. One egg equals 1 oz (28 g). 2-3 servings of low-fat dairy each day. One serving is 1 cup (237 mL). 1 serving of nuts, seeds, or beans 5 times each week. 2-3 servings of heart-healthy fats. Healthy fats called omega-3 fatty acids are found in foods such as walnuts, flaxseeds, fortified milks, and eggs. These fats are also found in cold-water fish, such as sardines, salmon, and mackerel. Limit how much you eat of: Canned or prepackaged foods. Food that is high in trans fat, such as some fried foods. Food that is high in saturated fat, such as fatty meat. Desserts and other sweets, sugary drinks, and other foods with added sugar. Full-fat dairy products. Do not salt foods before eating. Do not eat more than 4 egg yolks a week. Try to eat at least 2 vegetarian meals a week. Eat more home-cooked food and less restaurant, buffet, and fast food. Lifestyle When eating at a restaurant, ask that your food be prepared with less salt or no salt, if possible. If you drink alcohol: Limit how much you use to: 0-1 drink a day for women who are not pregnant. 0-2 drinks a day for men. Be aware of how much alcohol is in your drink. In the [place] one drink equals one 12 oz bottle of beer (355 mL), one 5 oz glass of wine (148 mL), or one 1 oz  glass of hard liquor (44 mL). General information Avoid eating more than 2,300 mg of salt a day. If you have hypertension, you may need to reduce your sodium intake to 1,500 mg a day. Work with your health care provider to maintain a healthy body weight or to lose weight. Ask what an ideal weight is for you. Get at least 30 minutes of exercise that causes your heart to beat faster (aerobic exercise) most days of the week. Activities may include walking, swimming, or biking. Work with your health care provider or dietitian to adjust your eating plan to your individual calorie needs. What foods should I eat? Fruits All fresh, dried, or frozen fruit. Canned fruit in natural juice (without added sugar). Vegetables Fresh or frozen vegetables (raw, steamed, roasted, or grilled). Low-sodium or reduced-sodium tomato and vegetable juice. Low-sodium or reduced-sodium tomato sauce and tomato paste. Low-sodium or reduced-sodium  canned vegetables. Grains Whole-grain or whole-wheat bread. Whole-grain or whole-wheat pasta. Brown rice. [place]. Bulgur. Whole-grain and low-sodium cereals. Pita bread. Low-fat, low-sodium crackers. Whole-wheat flour tortillas. Meats and other proteins Skinless chicken or [place]. Ground chicken or [place]. Pork with fat trimmed off. Fish and seafood. Egg whites. Dried beans, peas, or lentils. Unsalted nuts, nut butters, and seeds. Unsalted canned beans. Lean cuts of beef with fat trimmed off. Low-sodium, lean precooked or cured meat, such as sausages or meat loaves. Dairy Low-fat (1%) or fat-free (skim) milk. Reduced-fat, low-fat, or fat-free cheeses. Nonfat, low-sodium ricotta or cottage cheese. Low-fat or nonfat yogurt. Low-fat, low-sodium cheese. Fats and oils Soft margarine without trans fats. Vegetable oil. Reduced-fat, low-fat, or light mayonnaise and salad dressings (reduced-sodium). Canola, safflower, olive, avocado, soybean, and sunflower oils. Avocado. Seasonings and  condiments Herbs. Spices. Seasoning mixes without salt. Other foods Unsalted popcorn and pretzels. Fat-free sweets. The items listed above may not be a complete list of foods and beverages you can eat. Contact a dietitian for more information. What foods should I avoid? Fruits Canned fruit in a light or heavy syrup. Fried fruit. Fruit in cream or butter sauce. Vegetables Creamed or fried vegetables. Vegetables in a cheese sauce. Regular canned vegetables (not low-sodium or reduced-sodium). Regular canned tomato sauce and paste (not low-sodium or reduced-sodium). Regular tomato and vegetable juice (not low-sodium or reduced-sodium). Angie Fava. Olives. Grains Baked goods made with fat, such as croissants, muffins, or some breads. Dry pasta or rice meal packs. Meats and other proteins Fatty cuts of meat. Ribs. Fried meat. Berniece Salines. Bologna, salami, and other precooked or cured meats, such as sausages or meat loaves. Fat from the back of a pig (fatback). Bratwurst. Salted nuts and seeds. Canned beans with added salt. Canned or smoked fish. Whole eggs or egg yolks. Chicken or [place] with skin. Dairy Whole or 2% milk, cream, and half-and-half. Whole or full-fat cream cheese. Whole-fat or sweetened yogurt. Full-fat cheese. Nondairy creamers. Whipped toppings. Processed cheese and cheese spreads. Fats and oils Butter. Stick margarine. Lard. Shortening. Ghee. Bacon fat. Tropical oils, such as coconut, palm kernel, or palm oil. Seasonings and condiments Onion salt, garlic salt, seasoned salt, table salt, and sea salt. Worcestershire sauce. Tartar sauce. Barbecue sauce. Teriyaki sauce. Soy sauce, including reduced-sodium. Steak sauce. Canned and packaged gravies. Fish sauce. Oyster sauce. Cocktail sauce. Store-bought horseradish. Ketchup. Mustard. Meat flavorings and tenderizers. Bouillon cubes. Hot sauces. Pre-made or packaged marinades. Pre-made or packaged taco seasonings. Relishes. Regular salad  dressings. Other foods Salted popcorn and pretzels. The items listed above may not be a complete list of foods and beverages you should avoid. Contact a dietitian for more information. Where to find more information National Heart, Lung, and Blood Institute: https://wilson-eaton.com/ American Heart Association: www.heart.org Academy of Nutrition and Dietetics: www.eatright.[place]: www.kidney.org Summary The DASH eating plan is a healthy eating plan that has been shown to reduce high blood pressure (hypertension). It may also reduce your risk for type 2 diabetes, heart disease, and stroke. When on the DASH eating plan, aim to eat more fresh fruits and vegetables, whole grains, lean proteins, low-fat dairy, and heart-healthy fats. With the DASH eating plan, you should limit salt (sodium) intake to 2,300 mg a day. If you have hypertension, you may need to reduce your sodium intake to 1,500 mg a day. Work with your health care provider or dietitian to adjust your eating plan to your individual calorie needs. This information is not intended to replace advice given  to you by your health care provider. Make sure you discuss any questions you have with your health care provider. Document Revised: 03/02/2019 Document Reviewed: 03/02/2019 Elsevier Patient Education  [place].    If you have been instructed to have an in-person evaluation today at a local Urgent Care facility, please use the link below. It will take you to a list of all of our available [facility], including address, phone number and hours of operation. Please do not delay care.  [facility]  If you or a family member do not have a primary care provider, use the link below to schedule a visit and establish care. When you choose a [facility] primary care physician or advanced practice provider, you gain a long-term partner in health. Find a Primary Care Provider  Learn more about  [facility]'s in-office and virtual care options: [facility]

## 2021-09-28 ENCOUNTER — Other Ambulatory Visit: Payer: Self-pay | Admitting: Internal Medicine

## 2021-09-28 ENCOUNTER — Ambulatory Visit (INDEPENDENT_AMBULATORY_CARE_PROVIDER_SITE_OTHER): Payer: Federal, State, Local not specified - PPO | Admitting: Internal Medicine

## 2021-09-28 ENCOUNTER — Encounter: Payer: Self-pay | Admitting: Internal Medicine

## 2021-09-28 VITALS — BP 118/74 | HR 59 | Temp 98.3°F | Ht 62.25 in | Wt 157.0 lb

## 2021-09-28 DIAGNOSIS — Z6828 Body mass index (BMI) 28.0-28.9, adult: Secondary | ICD-10-CM | POA: Diagnosis not present

## 2021-09-28 DIAGNOSIS — I10 Essential (primary) hypertension: Secondary | ICD-10-CM

## 2021-09-28 DIAGNOSIS — Z1211 Encounter for screening for malignant neoplasm of colon: Secondary | ICD-10-CM

## 2021-09-28 DIAGNOSIS — R197 Diarrhea, unspecified: Secondary | ICD-10-CM

## 2021-09-28 DIAGNOSIS — Z0000 Encounter for general adult medical examination without abnormal findings: Secondary | ICD-10-CM

## 2021-09-28 DIAGNOSIS — Z86010 Personal history of colonic polyps: Secondary | ICD-10-CM

## 2021-09-28 LAB — POCT URINALYSIS DIPSTICK
Bilirubin, UA: NEGATIVE
Glucose, UA: NEGATIVE
Ketones, UA: NEGATIVE
Leukocytes, UA: NEGATIVE
Nitrite, UA: NEGATIVE
Protein, UA: NEGATIVE
Spec Grav, UA: >=1.03 — AB (ref 1.010–1.025)
Urobilinogen, UA: 0.2 E.U./dL
pH, UA: 5.5 (ref 5.0–8.0)

## 2021-09-28 MED ORDER — ONDANSETRON HCL 4 MG PO TABS: 4.0000 mg | ORAL_TABLET | Freq: Three times a day (TID) | ORAL | 0 refills | Status: DC | PRN

## 2021-09-28 MED ORDER — CIPROFLOXACIN HCL 500 MG PO TABS: 500.0000 mg | ORAL_TABLET | Freq: Two times a day (BID) | ORAL | 0 refills | Status: AC

## 2021-09-28 NOTE — Progress Notes (Signed)
Rich Brave [place],acting as a Education administrator for Leslie Greenland, MD.,have documented all relevant documentation on the behalf of Leslie Greenland, MD,as directed by  Leslie Greenland, MD while in the presence of Leslie Greenland, MD.  This visit occurred during the SARS-CoV-2 public health emergency.  Safety protocols were in place, including screening questions prior to the visit, additional usage of staff PPE, and extensive cleaning of exam room while observing appropriate contact time as indicated for disinfecting solutions.  Subjective:     Patient ID: Leslie Salazar , female    DOB: Jan 01, 1966 , 56 y.o.   MRN: [id number]   Chief Complaint  Patient presents with  . Annual Exam    HPI  She is here today for a full physical exam. She is followed by Chalmers Cater, MD GYN for her pelvic exams, she was last seen on 09/09/21. Patient also reports not feeling too well, she has been having some diarrhea.  She traveled 6/4 to [place]. She developed n/v on 6/11, after a sand dune ride. She did have some dizziness.  Earlier that day, she developed diarrhea. She admits to having a fever as well. She returned to [facility] on 6/12 and her diarrhea has persisted.   Hypertension This is a chronic problem. The current episode started more than 1 year ago. The problem has been rapidly improving since onset. The problem is controlled. Pertinent negatives include no blurred vision, neck pain or orthopnea. Risk factors for coronary artery disease include post-menopausal state. The current treatment provides moderate improvement. There are no compliance problems.      Past Medical History:  Diagnosis Date  . Acne   . Cancer ([place]) 2012   RIGHT DUCTAL CARCINOMA INSITU..  . Complication of anesthesia   . Condyloma    ON BUTTOCKS  . 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 Fibroids   . Heart palpitations   . High cholesterol   . History of angina   . HSV-1 (herpes simplex virus 1)  infection   . Hx of radiation therapy 2013   from april to may - six weeks  . Hypertension   . Left ovarian cyst   . PONV (postoperative nausea and vomiting)    nausea no vomiting  . Scoliosis   . Sleep apnea    c pap  . Urinary urgency   . Wears glasses      Family History  Problem Relation Age of Onset  . Diabetes Mother        HAS HAD A KIDNEY TRANSPLATN  . Hypertension Mother   . Kidney failure Mother   . Hypertension Father   . Gallstones Father   . Hypertension Sister   . Gallstones Sister   . Hypertension Sister   . Colon cancer Paternal Aunt   . Lung cancer Maternal Grandfather      Current Outpatient Medications:  .  aspirin EC 81 MG tablet, Take 81 mg by mouth every morning., Disp: , Rfl:  .  atorvastatin (LIPITOR) 20 MG tablet, Take 1 tablet (20 mg total) by mouth daily., Disp: 90 tablet, Rfl: 1 .  Cholecalciferol 25 MCG (1000 UT) tablet, Take 1 tablet by mouth daily., Disp: , Rfl:  .  ciprofloxacin (CIPRO) 500 MG tablet, Take 1 tablet (500 mg total) by mouth 2 (two) times daily for 3 days., Disp: 6 tablet, Rfl: 0 .  Coenzyme Q10 100 MG capsule, Take 1 capsule  by mouth daily., Disp: , Rfl:  .  Estradiol 10 MCG TABS vaginal tablet, Place 1 tablet (10 mcg total) vaginally once a week., Disp: 12 tablet, Rfl: 4 .  hydrochlorothiazide (MICROZIDE) 12.5 MG capsule, Take 1 capsule (12.5 mg total) by mouth daily., Disp: 90 capsule, Rfl: 1 .  hydrocortisone (ANUSOL-HC) 2.5 % rectal cream, Place 1 application rectally 2 (two) times daily. (Patient taking differently: Place 1 application  rectally as needed.), Disp: 30 g, Rfl: 3 .  imiquimod (ALDARA) 5 % cream, Apply topically 2 (two) times a week., Disp: , Rfl:  .  loratadine (CLARITIN) 10 MG tablet, Take 10 mg by mouth every morning. , Disp: , Rfl:  .  metoprolol succinate (TOPROL-XL) 25 MG 24 hr tablet, TAKE 1 TABLET(25 MG) BY MOUTH DAILY, Disp: 90 tablet, Rfl: 1 .  montelukast (SINGULAIR) 10 MG tablet, TAKE 1 TABLET(10  MG) BY MOUTH DAILY, Disp: 30 tablet, Rfl: 2 .  Multiple Vitamins-Minerals (MULTIVITAMIN WOMENS 50+ ADV PO), Take by mouth., Disp: , Rfl:  .  Omega-3 Fatty Acids (FISH OIL PO), Take by mouth. 1 capsule daily, Disp: , Rfl:  .  ondansetron (ZOFRAN) 4 MG tablet, Take 1 tablet (4 mg total) by mouth every 8 (eight) hours as needed for nausea or vomiting., Disp: 20 tablet, Rfl: 0 .  traZODone (DESYREL) 50 MG tablet, Take 1 tablet (50 mg total) by mouth at bedtime as needed for sleep., Disp: 90 tablet, Rfl: 1 .  valACYclovir (VALTREX) 500 MG tablet, Take 1 tablet (500 mg total) by mouth daily., Disp: 90 tablet, Rfl: 4   No Known Allergies    The patient states she uses {contraceptive methods:5051} for birth control. Last LMP was No LMP recorded. Patient is postmenopausal.. {Dysmenorrhea-menorrhagia:21918}. Negative for: breast discharge, breast lump(s), breast pain and breast self exam. Associated symptoms include abnormal vaginal bleeding. Pertinent negatives include abnormal bleeding (hematology), anxiety, decreased libido, depression, difficulty falling sleep, dyspareunia, history of infertility, nocturia, sexual dysfunction, sleep disturbances, urinary incontinence, urinary urgency, vaginal discharge and vaginal itching. Diet regular.The patient states her exercise level is    . The patient's tobacco use is:  Social History   Tobacco Use  Smoking Status Never  Smokeless Tobacco Never  . She has been exposed to passive smoke. The patient's alcohol use is:  Social History   Substance and Sexual Activity  Alcohol Use Yes  . Alcohol/week: 1.0 standard drink of alcohol  . Types: 1 Glasses of wine per week  . Additional information: Last pap ***, next one scheduled for ***.    Review of Systems  Constitutional: Negative.   HENT: Negative.    Eyes: Negative.  Negative for blurred vision.  Respiratory: Negative.    Cardiovascular: Negative.  Negative for orthopnea.  Gastrointestinal:  Positive  for diarrhea and nausea.  Endocrine: Negative.   Genitourinary: Negative.   Musculoskeletal: Negative.  Negative for neck pain.  Skin: Negative.   Allergic/Immunologic: Negative.   Neurological: Negative.   Hematological: Negative.   Psychiatric/Behavioral: Negative.      Today's Vitals   09/28/21 0908  BP: 118/74  Pulse: (!) 59  Temp: 98.3 F (36.8 C)  Weight: 157 lb (71.2 kg)  Height: 5' 2.25" (1.581 m)  PainSc: 0-No pain   Body mass index is 28.49 kg/m.  Wt Readings from Last 3 Encounters:  09/28/21 157 lb (71.2 kg)  09/09/21 159 lb (72.1 kg)  05/29/21 166 lb (75.3 kg)     Objective:  Physical Exam Chest:  Comments: S/p b/l mastectomy w/ reconstructive surgery; tattoos on anterior chest       Assessment And Plan:     1. Encounter for general adult medical examination w/o abnormal findings Comments: A full exam was performed. I will refer her to GI for CRC screening.  - CBC - CMP14+EGFR - Lipid panel  2. Essential hypertension, benign Comments: Chronic, well controlled. EKG performed, NSR w/ voltage criteria for LVH, T abnormality, anteroseptal ischemia.  - POCT Urinalysis Dipstick (81002) - Microalbumin / Creatinine Urine Ratio - EKG 12-Lead  3. Diarrhea, unspecified type Comments: Persistent, possibly traveler's diarrhea. I will order stool culture. I will send rx Cipro $Remove'500mg'GxtqWOC$  twice daily x 3days. Advised to not exercise while on meds.  - Culture, Stool; Future  4. BMI 28.0-28.9,adult Comments: She has been advised to stop exercising for the next 7 days while on Cipro. After that, she may gradually increase her daily activity.  5. Personal history of colonic polyps Comments: I will refer her to GI for CRC screening.  - Ambulatory referral to Gastroenterology  6. Screen for colon cancer Comments: The last colonoscopy documented in Epic is 2013. Pt thinks she had one performed in 2018, will request her records from Dr. Collene Mares.  - Ambulatory referral to  Gastroenterology  Patient was given opportunity to ask questions. Patient verbalized understanding of the plan and was able to repeat key elements of the plan. All questions were answered to their satisfaction.   I, Leslie Greenland, MD, have reviewed all documentation for this visit. The documentation on 09/28/21 for the exam, diagnosis, procedures, and orders are all accurate and complete.   THE PATIENT IS ENCOURAGED TO PRACTICE SOCIAL DISTANCING DUE TO THE COVID-19 PANDEMIC.

## 2021-09-28 NOTE — Patient Instructions (Addendum)
Pedialyte Coconut water Mashed/baked potato Ginger tea/ginger chews  Health Maintenance, Female Adopting a healthy lifestyle and getting preventive care are important in promoting health and wellness. Ask your health care provider about: The right schedule for you to have regular tests and exams. Things you can do on your own to prevent diseases and keep yourself healthy. What should I know about diet, weight, and exercise? Eat a healthy diet  Eat a diet that includes plenty of vegetables, fruits, low-fat dairy products, and lean protein. Do not eat a lot of foods that are high in solid fats, added sugars, or sodium. Maintain a healthy weight Body mass index (BMI) is used to identify weight problems. It estimates body fat based on height and weight. Your health care provider can help determine your BMI and help you achieve or maintain a healthy weight. Get regular exercise Get regular exercise. This is one of the most important things you can do for your health. Most adults should: Exercise for at least 150 minutes each week. The exercise should increase your heart rate and make you sweat (moderate-intensity exercise). Do strengthening exercises at least twice a week. This is in addition to the moderate-intensity exercise. Spend less time sitting. Even light physical activity can be beneficial. Watch cholesterol and blood lipids Have your blood tested for lipids and cholesterol at 55 years of age, then have this test every 5 years. Have your cholesterol levels checked more often if: Your lipid or cholesterol levels are high. You are older than 55 years of age. You are at high risk for heart disease. What should I know about cancer screening? Depending on your health history and family history, you may need to have cancer screening at various ages. This may include screening for: Breast cancer. Cervical cancer. Colorectal cancer. Skin cancer. Lung cancer. What should I know about  heart disease, diabetes, and high blood pressure? Blood pressure and heart disease High blood pressure causes heart disease and increases the risk of stroke. This is more likely to develop in people who have high blood pressure readings or are overweight. Have your blood pressure checked: Every 3-5 years if you are 66-47 years of age. Every year if you are 75 years old or older. Diabetes Have regular diabetes screenings. This checks your fasting blood sugar level. Have the screening done: Once every three years after age 19 if you are at a normal weight and have a low risk for diabetes. More often and at a younger age if you are overweight or have a high risk for diabetes. What should I know about preventing infection? Hepatitis B If you have a higher risk for hepatitis B, you should be screened for this virus. Talk with your health care provider to find out if you are at risk for hepatitis B infection. Hepatitis C Testing is recommended for: Everyone born from [birth year] through 1964. Anyone with known risk factors for hepatitis C. Sexually transmitted infections (STIs) Get screened for STIs, including gonorrhea and chlamydia, if: You are sexually active and are younger than 55 years of age. You are older than 55 years of age and your health care provider tells you that you are at risk for this type of infection. Your sexual activity has changed since you were last screened, and you are at increased risk for chlamydia or gonorrhea. Ask your health care provider if you are at risk. Ask your health care provider about whether you are at high risk for HIV. Your health care provider  may recommend a prescription medicine to help prevent HIV infection. If you choose to take medicine to prevent HIV, you should first get tested for HIV. You should then be tested every 3 months for as long as you are taking the medicine. Pregnancy If you are about to stop having your period (premenopausal) and you may  become pregnant, seek counseling before you get pregnant. Take 400 to 800 micrograms (mcg) of folic acid every day if you become pregnant. Ask for birth control (contraception) if you want to prevent pregnancy. Osteoporosis and menopause Osteoporosis is a disease in which the bones lose minerals and strength with aging. This can result in bone fractures. If you are 31 years old or older, or if you are at risk for osteoporosis and fractures, ask your health care provider if you should: Be screened for bone loss. Take a calcium or vitamin D supplement to lower your risk of fractures. Be given hormone replacement therapy (HRT) to treat symptoms of menopause. Follow these instructions at home: Alcohol use Do not drink alcohol if: Your health care provider tells you not to drink. You are pregnant, may be pregnant, or are planning to become pregnant. If you drink alcohol: Limit how much you have to: 0-1 drink a day. Know how much alcohol is in your drink. In the [place] one drink equals one 12 oz bottle of beer (355 mL), one 5 oz glass of wine (148 mL), or one 1 oz glass of hard liquor (44 mL). Lifestyle Do not use any products that contain nicotine or tobacco. These products include cigarettes, chewing tobacco, and vaping devices, such as e-cigarettes. If you need help quitting, ask your health care provider. Do not use street drugs. Do not share needles. Ask your health care provider for help if you need support or information about quitting drugs. General instructions Schedule regular health, dental, and eye exams. Stay current with your vaccines. Tell your health care provider if: You often feel depressed. You have ever been abused or do not feel safe at home. Summary Adopting a healthy lifestyle and getting preventive care are important in promoting health and wellness. Follow your health care provider's instructions about healthy diet, exercising, and getting tested or screened for  diseases. Follow your health care provider's instructions on monitoring your cholesterol and blood pressure. This information is not intended to replace advice given to you by your health care provider. Make sure you discuss any questions you have with your health care provider. Document Revised: 08/18/2020 Document Reviewed: 08/18/2020 Elsevier Patient Education  [place].

## 2021-09-29 ENCOUNTER — Encounter: Payer: Self-pay | Admitting: Internal Medicine

## 2021-09-29 LAB — CMP14+EGFR
ALT: 41 IU/L — ABNORMAL HIGH (ref 0–32)
AST: 33 IU/L (ref 0–40)
Albumin/Globulin Ratio: 1.8 (ref 1.2–2.2)
Albumin: 4.5 g/dL (ref 3.8–4.9)
Alkaline Phosphatase: 80 IU/L (ref 44–121)
BUN/Creatinine Ratio: 13 (ref 9–23)
BUN: 11 mg/dL (ref 6–24)
Bilirubin Total: 0.7 mg/dL (ref 0.0–1.2)
CO2: 23 mmol/L (ref 20–29)
Calcium: 9.6 mg/dL (ref 8.7–10.2)
Chloride: 103 mmol/L (ref 96–106)
Creatinine, Ser: 0.82 mg/dL (ref 0.57–1.00)
Globulin, Total: 2.5 g/dL (ref 1.5–4.5)
Glucose: 91 mg/dL (ref 70–99)
Potassium: 4 mmol/L (ref 3.5–5.2)
Sodium: 141 mmol/L (ref 134–144)
Total Protein: 7 g/dL (ref 6.0–8.5)
eGFR: 84 mL/min/{1.73_m2} (ref >59)

## 2021-09-29 LAB — CBC
Hematocrit: 44.7 % (ref 34.0–46.6)
Hemoglobin: 14.9 g/dL (ref 11.1–15.9)
MCH: 29.4 pg (ref 26.6–33.0)
MCHC: 33.3 g/dL (ref 31.5–35.7)
MCV: 88 fL (ref 79–97)
Platelets: 359 10*3/uL (ref 150–450)
RBC: 5.06 x10E6/uL (ref 3.77–5.28)
RDW: 12.3 % (ref 11.7–15.4)
WBC: 8 10*3/uL (ref 3.4–10.8)

## 2021-09-29 LAB — LIPID PANEL
Chol/HDL Ratio: 2.9 ratio (ref 0.0–4.4)
Cholesterol, Total: 103 mg/dL (ref 100–199)
HDL: 35 mg/dL — ABNORMAL LOW (ref >39)
LDL Chol Calc (NIH): 54 mg/dL (ref 0–99)
Triglycerides: 64 mg/dL (ref 0–149)
VLDL Cholesterol Cal: 14 mg/dL (ref 5–40)

## 2021-09-29 LAB — MICROALBUMIN / CREATININE URINE RATIO
Creatinine, Urine: 274.8 mg/dL
Microalb/Creat Ratio: 4 mg/g creat (ref 0–29)
Microalbumin, Urine: 12.2 ug/mL

## 2021-09-30 ENCOUNTER — Encounter: Payer: Self-pay | Admitting: Internal Medicine

## 2021-10-02 LAB — STOOL CULTURE: E coli, Shiga toxin Assay: NEGATIVE

## 2021-10-06 ENCOUNTER — Encounter: Payer: Self-pay | Admitting: Internal Medicine

## 2021-10-06 ENCOUNTER — Other Ambulatory Visit: Payer: Self-pay | Admitting: Internal Medicine

## 2021-10-06 DIAGNOSIS — R197 Diarrhea, unspecified: Secondary | ICD-10-CM

## 2021-10-07 ENCOUNTER — Other Ambulatory Visit: Payer: Self-pay

## 2021-10-07 ENCOUNTER — Ambulatory Visit (INDEPENDENT_AMBULATORY_CARE_PROVIDER_SITE_OTHER): Payer: Federal, State, Local not specified - PPO | Admitting: Obstetrics & Gynecology

## 2021-10-07 ENCOUNTER — Encounter: Payer: Self-pay | Admitting: Obstetrics & Gynecology

## 2021-10-07 VITALS — BP 114/78

## 2021-10-07 DIAGNOSIS — R197 Diarrhea, unspecified: Secondary | ICD-10-CM | POA: Diagnosis not present

## 2021-10-07 DIAGNOSIS — A630 Anogenital (venereal) warts: Secondary | ICD-10-CM

## 2021-10-07 NOTE — Progress Notes (Signed)
    Leslie Salazar Sep 12, 1965 [id number]        56 y.o.  G1P1001   RP: Condyloma between the buttocks for freezing destruction procedure  HPI: Long standing bothersome condyloma between the buttocks.  Pap Neg 09/09/21.  No other condyloma seen on Gyn health exam 09/09/21.   OB History  Gravida Para Term Preterm AB Living  '1 1 1     1  '$ SAB IAB Ectopic Multiple Live Births          1    # Outcome Date GA Lbr Len/2nd Weight Sex Delivery Anes PTL Lv  1 Term     M CS-Unspec  N LIV    Past medical history,surgical history, problem list, medications, allergies, family history and social history were all reviewed and documented in the EPIC chart.   Directed ROS with pertinent positives and negatives documented in the history of present illness/assessment and plan.  Exam:  Vitals:   10/07/21 0853  BP: 114/78   General appearance:  Normal   Gynecologic exam: Normal on 09/09/21, deferred today  Buttocks:  Raised condyloma midway between the buttocks.  Verruca Freeze device used to freeze the condyloma completely, including the base.  Multiple freezing applications done.   Assessment/Plan:  56 y.o. G1P1001   1. Condyloma acuminatum in female  Long standing bothersome condyloma between the buttocks.  Pap Neg 09/09/21.  No other condyloma seen on Gyn health exam 09/09/21.  On exam: Buttocks:  Raised condyloma midway between the buttocks.  Verruca Freeze device used to freeze the condyloma completely, including the base.  Multiple freezing applications done.  Well tolerated, no Cx.  Post procedure precautions reviewed.  Will observe x 4 weeks, if residual condyloma persist, will make an appointment for a second freezing procedure or excision.  Princess Bruins MD, 9:28 AM 10/07/2021

## 2021-10-08 DIAGNOSIS — Z1211 Encounter for screening for malignant neoplasm of colon: Secondary | ICD-10-CM | POA: Diagnosis not present

## 2021-10-08 DIAGNOSIS — R109 Unspecified abdominal pain: Secondary | ICD-10-CM | POA: Diagnosis not present

## 2021-10-08 DIAGNOSIS — K219 Gastro-esophageal reflux disease without esophagitis: Secondary | ICD-10-CM | POA: Diagnosis not present

## 2021-10-08 DIAGNOSIS — R194 Change in bowel habit: Secondary | ICD-10-CM | POA: Diagnosis not present

## 2021-10-09 LAB — OVA AND PARASITE EXAMINATION

## 2021-10-09 LAB — CLOSTRIDIUM DIFFICILE EIA: C difficile Toxins A+B, EIA: NEGATIVE

## 2021-10-14 ENCOUNTER — Ambulatory Visit (INDEPENDENT_AMBULATORY_CARE_PROVIDER_SITE_OTHER): Payer: Federal, State, Local not specified - PPO

## 2021-10-14 ENCOUNTER — Other Ambulatory Visit: Payer: Self-pay | Admitting: Obstetrics & Gynecology

## 2021-10-14 DIAGNOSIS — Z13820 Encounter for screening for osteoporosis: Secondary | ICD-10-CM

## 2021-10-14 DIAGNOSIS — Z780 Asymptomatic menopausal state: Secondary | ICD-10-CM

## 2021-10-16 DIAGNOSIS — G4733 Obstructive sleep apnea (adult) (pediatric): Secondary | ICD-10-CM | POA: Diagnosis not present

## 2021-10-22 ENCOUNTER — Other Ambulatory Visit: Payer: Self-pay | Admitting: Internal Medicine

## 2021-10-23 ENCOUNTER — Other Ambulatory Visit: Payer: Self-pay | Admitting: Internal Medicine

## 2021-11-09 ENCOUNTER — Other Ambulatory Visit: Payer: Self-pay | Admitting: Internal Medicine

## 2021-11-16 DIAGNOSIS — G4733 Obstructive sleep apnea (adult) (pediatric): Secondary | ICD-10-CM | POA: Diagnosis not present

## 2021-11-27 ENCOUNTER — Encounter: Payer: Self-pay | Admitting: Neurology

## 2021-12-03 ENCOUNTER — Encounter: Payer: Self-pay | Admitting: Neurology

## 2021-12-03 ENCOUNTER — Ambulatory Visit (INDEPENDENT_AMBULATORY_CARE_PROVIDER_SITE_OTHER): Payer: Federal, State, Local not specified - PPO | Admitting: Neurology

## 2021-12-03 VITALS — BP 134/91 | HR 64 | Ht 62.5 in | Wt 158.5 lb

## 2021-12-03 DIAGNOSIS — Z9989 Dependence on other enabling machines and devices: Secondary | ICD-10-CM

## 2021-12-03 DIAGNOSIS — M25611 Stiffness of right shoulder, not elsewhere classified: Secondary | ICD-10-CM | POA: Insufficient documentation

## 2021-12-03 DIAGNOSIS — Z923 Personal history of irradiation: Secondary | ICD-10-CM | POA: Insufficient documentation

## 2021-12-03 DIAGNOSIS — G4733 Obstructive sleep apnea (adult) (pediatric): Secondary | ICD-10-CM | POA: Diagnosis not present

## 2021-12-03 DIAGNOSIS — N651 Disproportion of reconstructed breast: Secondary | ICD-10-CM

## 2021-12-03 DIAGNOSIS — I259 Chronic ischemic heart disease, unspecified: Secondary | ICD-10-CM | POA: Insufficient documentation

## 2021-12-03 DIAGNOSIS — R200 Anesthesia of skin: Secondary | ICD-10-CM

## 2021-12-03 DIAGNOSIS — R0602 Shortness of breath: Secondary | ICD-10-CM | POA: Insufficient documentation

## 2021-12-03 DIAGNOSIS — R202 Paresthesia of skin: Secondary | ICD-10-CM

## 2021-12-03 NOTE — Patient Instructions (Signed)
OSA  well controlled on CPAP, which high compliance.   12-03-2021.  OSA is a service related condition in this patient :   2)She has ischemic heart disease, and Breast cancer, she reached out to disabled [place] Veterans: she had double mastectomy, reconstructive surgery with latissimus flap. She remains with a restrictive ROM of the right upper extremity and chest wall.  Breast cancer Radiation added higher risk  of ung fibrosis and scaring .  She has trouble to find a comfortable sleep position.   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  3)Ischemic heart disease is another known risk  factor for OSA and CSA, and can manifest as fatigue after OSA is treated.  She had a positive stress test 2015 , and yearly since.  She is dx with HTN. The patient achieved a 10% disability assignment in November 2015 based on workload of greater than 7 METS but not greater than 10 METS resulting in dyspnea, fatigue, angina, lightheadedness or presyncope-syncope.   She has a left ventricular known dysfunction with an ejection fraction of more than 50%.  At the time of a higher evaluation of 30% was not warranted for arteriosclerotic heart disease but since then she had EKGs and echocardiograms that have shown progression of her ischemia.  Larey Seat, MD

## 2021-12-03 NOTE — Progress Notes (Signed)
SLEEP MEDICINE CLINIC    Provider:  Melvyn Novas, MD  Primary Care Physician:  Dorothyann Peng, MD [address] [place] [address]     Referring Provider: Dorothyann Peng, Md [address] [place],  [place] [address]   PRIMARY NEUROLOGIST: Dr Marjory Lies:          Chief Complaint according to patient   Patient presents with:     New Patient (Initial Visit)      rm 10. Presents today for concern for sleep apnea. Never had SS. Avg about 7-8 hr of sleep during the night but broken and not restful. She wakes up still feeling tired and sometimes with dull headaches. Has been told she snores in sleep and possibly has irregular breathing      HISTORY OF PRESENT ILLNESS:  Leslie Salazar is a 56 y.o.  African American female patient  was seen here in a RV on 12/03/2021.  She has undergone a sleep study and started CPAP therapy. Last seen by MM on 05-29-2021: She recently  traveled to [place], a wonderful trip, but she felt tired- her AHI was well controlled.   She has ischemic heart disease, and Breast cancer, she reached out to disabled [place] Veterans: she had double mastectomy, reconstructive surgery with latissimus flap. She remains with a restrictive ROM of the right upper extremity and chest wall.  Breast cancer Radiation added higher risk  of fibrosis.  She has trouble to find a comfortable sleep position.  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  Ischemic heart disease is another known risk  factor for OSA and CSA, and can manifest as fatigue after OSA is treated.  She had a positive stress test 2015 , and yearly since.  The patient achieved a 10% disability assignment in November 2015 based on workload of greater than 7  METS but not greater than 10 METS resulting in dyspnea, fatigue, angina, lightheadedness or presyncope-syncope.  She has a left ventricular known dysfunction with an ejection fraction of more than 50%.  At the time of a higher evaluation of 30% was not warranted for arteriosclerotic heart disease but since then she had EKGs and echocardiograms that have shown progression of her ischemia. Her OSA is more than likely related to her service related ischemic heart disease and HTN.   The patient endorsed the Epworth sleepiness scale today at 14 points and she felt the sleepiness while watching television and less sleepy when sitting and driving or when stopped in traffic.  CPAP compliance was 90% 27 out of 30 days with an average use at time of 6 hours 31 minutes.  The minimum pressure is set at 6 and maximum pressure at 16 cm water pressure the patient's average pressure at night at the 95th percentile was 7.4 cm water.  Average air leak was 1.3 L/min.  The residual AHI was 0.2 the average central AHI was 0.1.  This is a very good reduction of the baseline sleep apnea with her which had reached just under 20 AHI.  I like for the patient to continue using her CPAP under the current settings and she seems to have achieved a good mask fit as there is no significant air leakage.    I would like her to  follow-up through my nurse practitioner in 6-8 months when she has news from the [place].          05/29/21: Primary Neurologist is Dr. Leta Baptist, MD.   Chief concern according to patient :  non restorative sleep, worse since oophorectomy. Her Sister's have witnessed her snoring, and gasping in her sleep. Hot flushes, sleep headaches, palpitations.    [name]  has a past medical history of Acne, breast Cancer ([facility]) (2012), mastectomy bilateral- 04/2011. Complication of anesthesia, History of angina, hx of radiation therapy (2013),Condyloma, DCIS (ductal carcinoma in situ), Fibroids, Heart palpitations,  High cholesterol, , Hypertension, Left ovarian cyst, Scoliosis, Urinary urgency, and non restorative sleep. Ovariectomy, 2021-Medical menopause, 6.5 years on tamoxifen and related Weight gain.     Epworth sleepiness score:15 /24.   BMI: 31.6 kg/m   Neck Circumference: 15"   FINDINGS:   Sleep Summary:   Total Recording Time (hours, min): Amounted to 9 hours and 39 minutes of which 8 hours and 57 minutes were recorded sleep time.  REM sleep amounted to 25% of total sleep time.                                     Respiratory Indices:   Calculated pAHI (per hour):   The overall AHI( apnea hypopnea index) was calculated at 17.4/h during REM sleep much higher at 34.4/h and in non-REM sleep 11.8/h.                                                 Positional AHI: In supine sleep the AHI was 18.9/h the RDI 21/h in nonsupine sleep AHI was 14.2/h and RDI 16.1/h.  The patient slept mainly in supine position with 349 minutes.  Snoring level was moderate with a mean volume of 40 dB.  Snoring accompanied only 4% of the total sleep time.                                                 Oxygen Saturation Statistics:   O2 Saturation Range (%):   Between a nadir at 83% and a maximum of 98% with a mean oxygen saturation of 92%.                                    O2 Saturation (minutes) <89%: Amounted to 2.1 minutes the equivalent of 0.4% of the total sleep time.         Pulse Rate Statistics:        Pulse Range:   Between 50 bpm at 101 bpm with a mean heart rate of 64 bpm.              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  No major hypoxia events were noted, heart rate varies been in normal range, and REM sleep was present there is a proportion of 25%.   RECOMMENDATION: Given the REM sleep distribution I would strongly recommend positive airway pressure therapy which will also address snoring,as  the patient does report  excessive daytime sleepiness (which may well respond to the sleep apnea treatments).  Auto titration CPAP would be recommended, with a setting between 6 and 16 cmH2O to centimeter EPR, heated humidification and mask of choice.  For the hormonal related hot flashes and insomnia there different treatment approaches that are not requiring hormone replacement therapy. Phytohormones deriving from Soy may be a possible treatment.   Ms. Ziesmer is a 56 year old female with a history of obstructive sleep apnea on CPAP.  This is her initial compliance visit.  She reports that she has noticed some benefit from using the CPAP.  Still sleepy during the day specifically if she watches television.  She states that she finds that her mask leaks and it takes her a while to get it fitted appropriately.  Her download is below      Chief concern according to patient :  non restorative sleep, worse since oophorectomy. Sister's have witnessed  snoring, and gasping in her sleep. Hot flushes, sleep headaches, palpitations.     I have the pleasure of seeing STEPHANIA MACFARLANE today, a right -handed Black or [place] American female with a possible sleep disorder.  She has a  has a past medical history of Acne, breast Cancer ([facility]) (2012), mastectomy bilateral- 04/2011. Complication of anesthesia, Condyloma, DCIS (ductal carcinoma in situ), Fibroids, Heart palpitations, High cholesterol, History of angina, hx of radiation therapy (2013), Hypertension, Left ovarian cyst, Scoliosis, Urinary urgency, and non restorative sleep. Ovariectomy, 2021-Medical menopause, 6.5 years on tamoxifen. Weight gain.     Sleep relevant medical history: Nocturia 2- more,seasonal allergic rhinitis.  post nasal drip.   Family medical /sleep history: no other family member on CPAP with OSA, neither insomnia. .    Social history:  Patient is working as a Actuary of [employer] and lives in a household with spouse. Family status is married  , with children , adult son is 37, not living at home. No pets.  The patient currently works daytime, partly of from home . Tobacco use/.  ETOH use 2/ month , Caffeine intake in form of Coffee( 1 cup in AM ) , routinely GYM visits, private trainer.  Hobbies : travelling, walking.       Sleep habits are as follows: The patient's dinner time is between 6 PM. The patient goes to bed at 10 PM and often struggles to go to sleep before midnight- continues to sleep for 2 hours, wakes for 1-2 bathroom breaks, the first time at 2.30 AM.  The bedroom is quit and cool, and daylight penetrates the room in the morning.  The preferred sleep position is side and prone , with the support of 2 pillows.  Dreams are reportedly frequent.  6-7  AM is the usual rise time. The patient wakes up with an alarm.  She reports not feeling refreshed or restored in AM, with symptoms such as  tingling  in the right hand- dry mouth, morning headaches, and residual fatigue.  Naps are taken infrequently, lasting from 15 to 30 minutes and are more refreshing than nocturnal sleep.    Review of Systems: Out of a complete 14 system review, the patient complains of only the following symptoms, and all other reviewed systems are negative.:  Fatigue, sleepiness , snoring, fragmented sleep, Insomnia - since full menopause.    How likely are you to doze in the following situations: 0 = not likely, 1 = slight chance, 2 = moderate chance, 3 =  high chance   Sitting and Reading? Watching Television? Sitting inactive in a public place (theater or meeting)? As a passenger in a car for an hour without a break? Lying down in the afternoon when circumstances permit? Sitting and talking to someone? Sitting quietly after lunch without alcohol? In a car, while stopped for a few minutes in traffic?   Total = 15/ 24 points   FSS endorsed at 38/ 63 points. Since her oophorectomy  Social History   Socioeconomic History   Marital status:  Legally Separated    Spouse name: Not on file   Number of children: 1   Years of education: Not on file   Highest education level: Master's degree (e.g., MA, MS, MEng, MEd, MSW, MBA)  Occupational History   Not on file  Tobacco Use   Smoking status: Never   Smokeless tobacco: Never  Vaping Use   Vaping Use: Never used  Substance and Sexual Activity   Alcohol use: Yes    Alcohol/week: 1.0 standard drink of alcohol    Types: 1 Glasses of wine per week   Drug use: No   Sexual activity: Not Currently    Birth control/protection: Surgical    Comment: 1ST INTERCOURSE- 57, PARTNERS- 4, btl & oophorectomy  Other Topics Concern   Not on file  Social History Narrative   Not on file   Social Determinants of Health   Financial Resource Strain: Not on file  Food Insecurity: Not on file  Transportation Needs: Not on file  Physical Activity: Not on file  Stress: Not on file  Social Connections: Not on file    Family History  Problem Relation Age of Onset   Diabetes Mother        HAS HAD A KIDNEY TRANSPLATN   Hypertension Mother    Kidney failure Mother    Hypertension Father    Gallstones Father    Hypertension Sister    Gallstones Sister    Hypertension Sister    Colon cancer Paternal Aunt    Lung cancer Maternal Grandfather     Past Medical History:  Diagnosis Date   Acne    Cancer ([facility]) 2012   RIGHT DUCTAL CARCINOMA INSITU.Marland Kitchen   Complication of anesthesia    Condyloma    ON BUTTOCKS   DCIS (ductal carcinoma in situ)    BRCA 1and 2 Neg./Pos ER, Pos PR   Fibroids    Heart palpitations    High cholesterol    History of angina    HSV-1 (herpes simplex virus 1) infection    Hx of radiation therapy 2013   from april to may - six weeks   Hypertension    Left ovarian cyst    PONV (postoperative nausea and vomiting)    nausea no vomiting   Scoliosis    Sleep apnea    c pap   Urinary urgency    Wears glasses     Past Surgical History:  Procedure Laterality Date    BREAST CAPSULECTOMY WITH IMPLANT EXCHANGE Right 06/27/2019   Procedure: release of capsular contracture with repositioning of the right implant with possible exchange of right breast implant;  Surgeon: Wallace Going, DO;  Location: [facility];  Service: Plastics;  Laterality: Right;   BREAST RECONSTRUCTION WITH PLACEMENT OF TISSUE EXPANDER AND FLEX HD (ACELLULAR HYDRATED DERMIS) Right 11/16/2012   Procedure: RIGHT LATISSIMUS myocutaneous FLAP WITH PLACEMENT OF TISSUE EXPANDER  TO RIGHT BREAST;  Surgeon: Theodoro Kos, DO;  Location: [facility];  Service: Clinical cytogeneticist;  Laterality: Right;   CARDIAC CATHETERIZATION N/A 02/28/2015   Procedure: Left Heart Cath and Coronary Angiography;  Surgeon: Adrian Prows, MD;  Location: [facility];  Service: Cardiovascular;  Laterality: N/A;   CESAREAN SECTION  2000   ENDOMETRIAL ABLATION W/ NOVASURE  2007   INSERTION OF TISSUE EXPANDER AFTER MASTECTOMY Bilateral 04/20/2011   Breast Ca (r) T1b N0 M0 stage IA infiltrating ductal carcinoma   LAPAROSCOPIC BILATERAL SALPINGO OOPHERECTOMY Bilateral 08/30/2018   Procedure: LAPAROSCOPIC BILATERAL SALPINGO OOPHORECTOMY w/ PERITONEAL WASHINGS;  Surgeon: Princess Bruins, MD;  Location: [facility];  Service: Gynecology;  Laterality: Bilateral;   LASER ABLATION OF THE CERVIX  1994   FOR DYSPLASIA   LIPOSUCTION WITH LIPOFILLING Right 03/14/2013   Procedure: LIPOSUCTION WITH LIPOFILLING;  Surgeon: Theodoro Kos, DO;  Location: [facility];  Service: Plastics;  Laterality: Right;   LIPOSUCTION WITH LIPOFILLING Right 06/27/2019   Procedure: Fat grafting to right breast with excision of back excess skin;  Surgeon: Wallace Going, DO;  Location: [facility];  Service: Plastics;  Laterality: Right;  2 hours, please   RECONSTRUCTION BREAST W/ LATISSIMUS DORSI FLAP Right 11/16/2012   REMOVAL OF TISSUE EXPANDER AND PLACEMENT OF IMPLANT Right 03/14/2013   Procedure: REMOVAL  OF RIGHT BREAST TISSUE EXPANDER WITH PLACEMENT OF RIGHT BREAST IMPLANT;  Surgeon: Theodoro Kos, DO;  Location: [facility];  Service: Plastics;  Laterality: Right;  Removal of Right Breast Tissue Expander with Placement of Right Breast Implant, Possible Lipsuction with Lipofilling   TISSUE EXPANDER PLACEMENT Right 11/16/2012   TISSUE EXPANDER REMOVAL Bilateral 12/2011   TUBAL LIGATION  09/08/2001   BY LAPAROSCOPY ,, HULKA CLIPS TECHNIQUE   WISDOM TOOTH EXTRACTION       Current Outpatient Medications on File Prior to Visit  Medication Sig Dispense Refill   aspirin EC 81 MG tablet Take 81 mg by mouth every morning.     atorvastatin (LIPITOR) 20 MG tablet TAKE 1 TABLET(20 MG) BY MOUTH DAILY 90 tablet 1   Cholecalciferol 25 MCG (1000 UT) tablet Take 1 tablet by mouth daily.     Coenzyme Q10 100 MG capsule Take 1 capsule by mouth daily.     Estradiol 10 MCG TABS vaginal tablet Place 1 tablet (10 mcg total) vaginally once a week. 12 tablet 4   hydrochlorothiazide (MICROZIDE) 12.5 MG capsule TAKE 1 CAPSULE(12.5 MG) BY MOUTH DAILY 90 capsule 1   hydrocortisone (ANUSOL-HC) 2.5 % rectal cream Place 1 application rectally 2 (two) times daily. (Patient taking differently: Place 1 application  rectally as needed.) 30 g 3   imiquimod (ALDARA) 5 % cream Apply topically 2 (two) times a week.     loratadine (CLARITIN) 10 MG tablet Take 10 mg by mouth every morning.      metoprolol succinate (TOPROL-XL) 25 MG 24 hr tablet TAKE 1 TABLET(25 MG) BY MOUTH DAILY 90 tablet 1   montelukast (SINGULAIR) 10 MG tablet TAKE 1 TABLET(10 MG) BY MOUTH DAILY 30 tablet 2   Multiple Vitamins-Minerals (MULTIVITAMIN WOMENS 50+ ADV PO) Take by mouth.     Omega-3 Fatty Acids (FISH OIL PO) Take by mouth. 1 capsule daily     ondansetron (ZOFRAN) 4 MG tablet Take 1 tablet (4 mg total) by mouth every 8 (eight) hours as needed for nausea or vomiting. 20 tablet 0   traZODone (DESYREL) 50 MG tablet Take 1 tablet (50 mg total)  by mouth at bedtime as needed for sleep.  90 tablet 1   valACYclovir (VALTREX) 500 MG tablet Take 1 tablet (500 mg total) by mouth daily. 90 tablet 4   No current facility-administered medications on file prior to visit.    No Known Allergies  Physical exam:  Today's Vitals   12/03/21 1521  BP: (!) 134/91  Pulse: 64  Weight: 158 lb 8 oz (71.9 kg)  Height: 5' 2.5" (1.588 m)   Body mass index is 28.53 kg/m.   Wt Readings from Last 3 Encounters:  12/03/21 158 lb 8 oz (71.9 kg)  09/28/21 157 lb (71.2 kg)  09/09/21 159 lb (72.1 kg)     Ht Readings from Last 3 Encounters:  12/03/21 5' 2.5" (1.588 m)  09/28/21 5' 2.25" (1.581 m)  09/09/21 5' 2.25" (1.581 m)      General: The patient is awake, alert and appears not in acute distress. The patient is well groomed. Head: Normocephalic, atraumatic. Neck is supple. Mallampati 3 plus ,  neck circumference:15 inches . Nasal airflow  patent.  Retrognathia is present .  Dental status: small lower jaw, scalloped tongue.  Cardiovascular:  Regular rate and cardiac rhythm by pulse,  without distended neck veins. Respiratory: Lungs are clear to auscultation.  Skin:  Without evidence of ankle edema, or rash. Trunk: The patient's posture is erect.   Neurologic exam : The patient is awake and alert, oriented to place and time.   Memory subjective described as intact.  Attention span & concentration ability appears normal.  Speech is fluent,  without  dysarthria, dysphonia or aphasia.  Mood and affect are appropriate.   Cranial nerves: no loss of smell or taste reported  Pupils are equal and briskly reactive to light. Funduscopic exam deferred. .  Extraocular movements in vertical and horizontal planes were intact and without nystagmus. No Diplopia. Visual fields by finger perimetry are intact. Hearing was intact to soft voice and finger rubbing.    Facial sensation intact to fine touch.  Facial motor strength is symmetric and tongue and  uvula move midline.  Neck ROM : rotation, tilt and flexion extension were normal for age and shoulder shrug was symmetrical.    Motor exam:  Symmetric bulk, tone and ROM.   Normal tone without cog -wheeling, symmetric grip strength .   Sensory:  Fine touch and vibration were  normal.  Proprioception tested in the upper extremities was normal.   Coordination: Rapid alternating movements in the fingers/hands were of normal speed.  The Finger-to-nose maneuver was intact without evidence of ataxia, dysmetria or tremor.   Gait and station: Patient could rise unassisted from a seated position, walked without assistive device.  Stance is of normal width/ base and the patient turned with 3 steps ( RN observation) .  Toe and heel walk were deferred.  Deep tendon reflexes: in the  upper and lower extremities are symmetric and intact.  Babinski response was deferred.    reviewed medication, NCV and EMG from 4/10-2020 with Dr Gladstone Lighter.    After spending a total time of 35 minutes face to face and additional time for physical and neurologic examination, review of laboratory studies,  personal review of imaging studies, reports and results of other testing and review of referral information / records as far as provided in visit, I have established the following assessments:  1)OSA  well controlled on CPAP, which high compliance.  OSA is a service related condition in this patient :   2)She has ischemic heart disease, and Breast cancer, she  reached out to disabled [place] Veterans: she had double mastectomy, reconstructive surgery with latissimus flap. She remains with a restrictive ROM of the right upper extremity and chest wall.  Breast cancer Radiation added higher risk  of ung fibrosis and scaring .  She has trouble to find a comfortable sleep position.   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  3)Ischemic heart disease is another known risk  factor for OSA and CSA, and can manifest as fatigue after OSA is treated.  She had a positive stress test 2015 , and yearly since.  She is dx with HTN. The patient achieved a 10% disability assignment in November 2015 based on workload of greater than 7 METS but not greater than 10 METS resulting in dyspnea, fatigue, angina, lightheadedness or presyncope-syncope.  She has a left ventricular known dysfunction with an ejection fraction of more than 50%.  At the time of a higher evaluation of 30% was not warranted for arteriosclerotic heart disease but since then she had EKGs and echocardiograms that have shown progression of her ischemia.   F[place] ; Her OSA is more than likely related to her service related ischemic heart disease and HTN. Her breast cancer treatments have been adding risk as described above.   I would like to thank Glendale Chard, [address] [place],  [place] [address] for allowing me to meet with and to take care of this pleasant patient.   In short, ANASTASIYA GOWIN is presenting with above symptoms. , I plan to follow up either personally or through our NP within 2-4 month.   CC: I will share my notes with Dr Baird Cancer and Dr. Leta Baptist .  Electronically signed by: Larey Seat, MD 12/03/2021 3:51 PM  [facility] and [facility] Sleep Board certified by The [employer] of Sleep Medicine and Diplomate of the [employer] of Sleep Medicine. Board certified In Neurology through the [place], Fellow of the [employer] of Neurology. Medical Director of [employer].

## 2021-12-04 ENCOUNTER — Other Ambulatory Visit: Payer: Self-pay | Admitting: Internal Medicine

## 2021-12-09 IMAGING — US US BREAST*L* COMPLETE INC AXILLA
1 series · 10 of 10 positions shown · non-contrast
Comparison: Previous exam(s).

CLINICAL DATA: Lateral bulging of the patient's right breast
implant. Clinical concern for the possibility of implant rupture.
Status post bilateral mastectomy silicone implant reconstruction as
well as right latissimus flap reconstruction.

EXAM:
ULTRASOUND OF THE BILATERAL BREAST

[Series 1: us breast*left* complete inc axilla · 0.06mm/px · 10 of 10 slices shown]
[im 1/10]
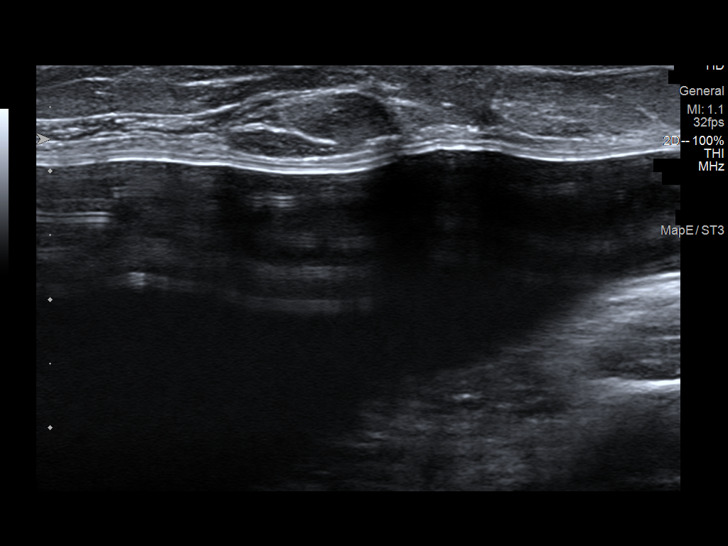
[im 2/10]
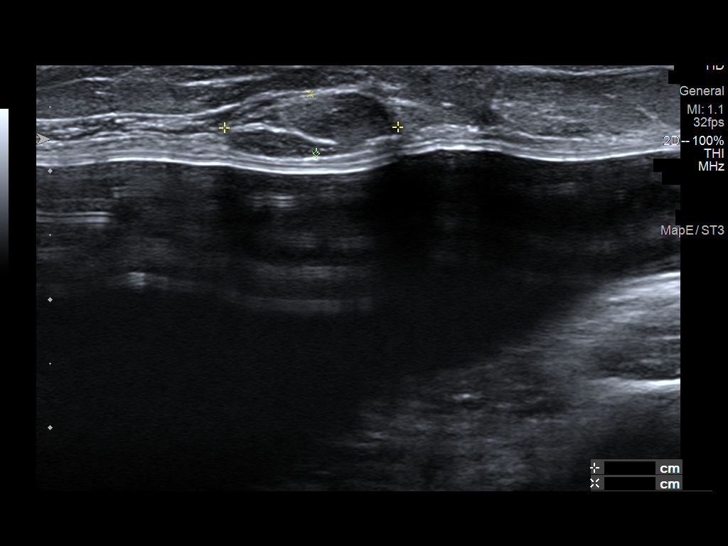
[im 3/10]
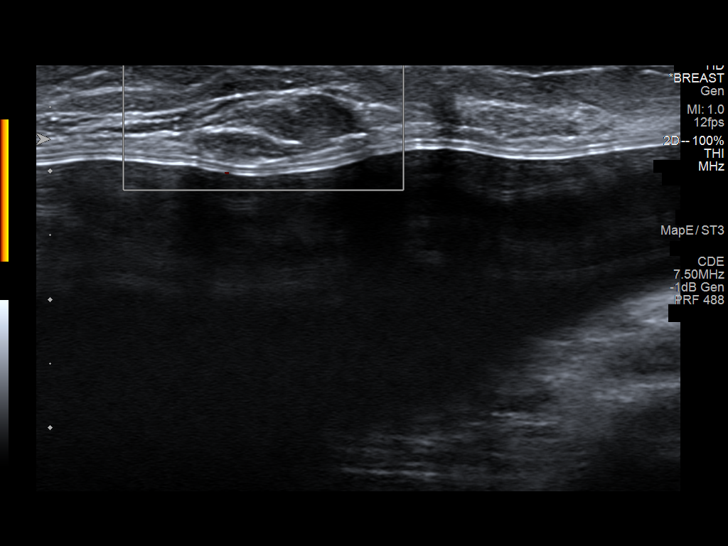
[im 4/10]
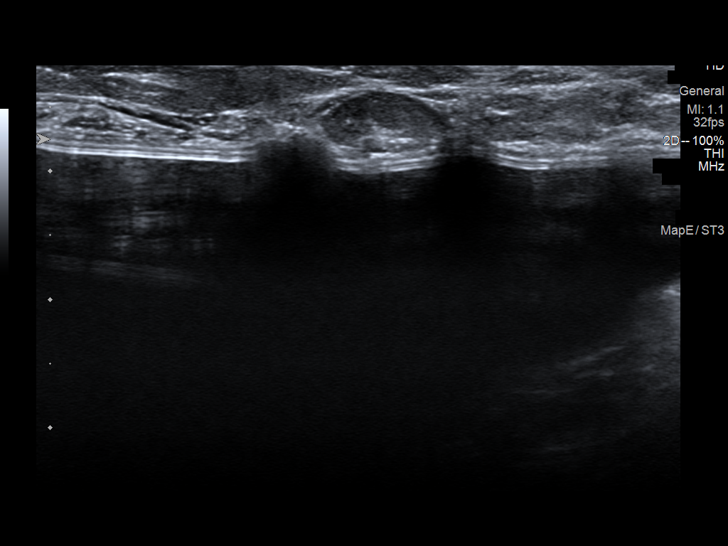
[im 5/10]
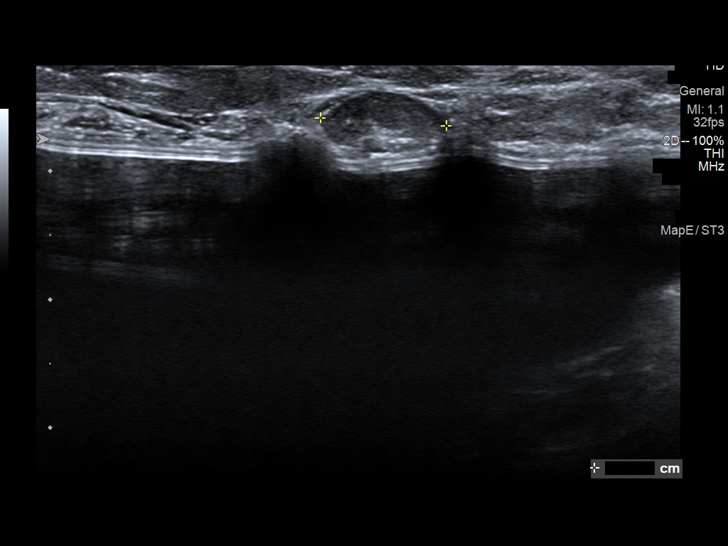
[im 6/10]
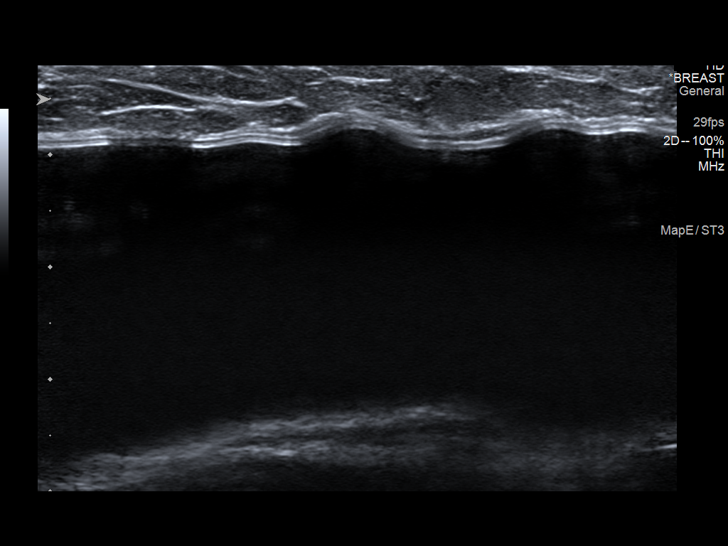
[im 7/10]
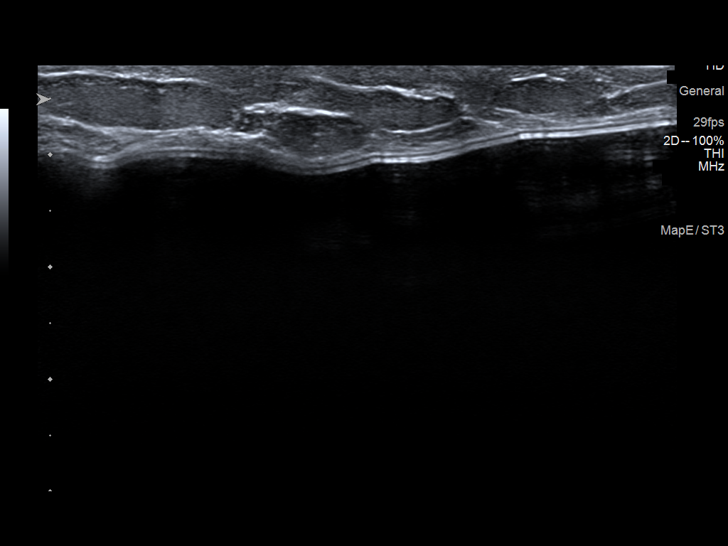
[im 8/10]
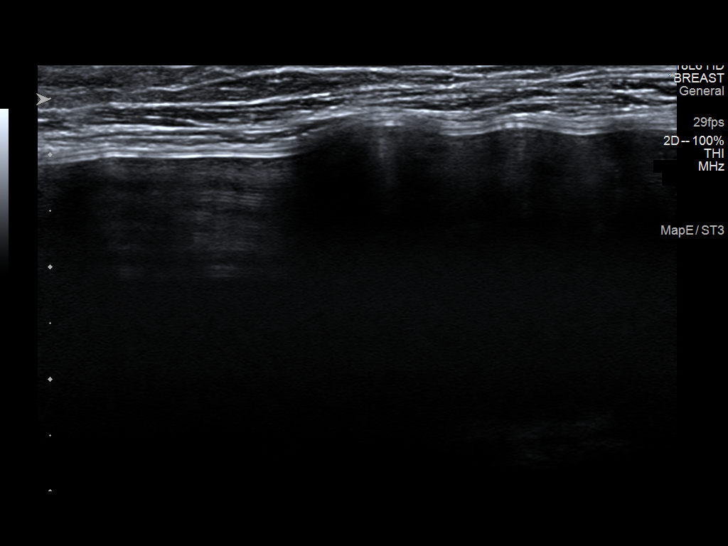
[im 9/10]
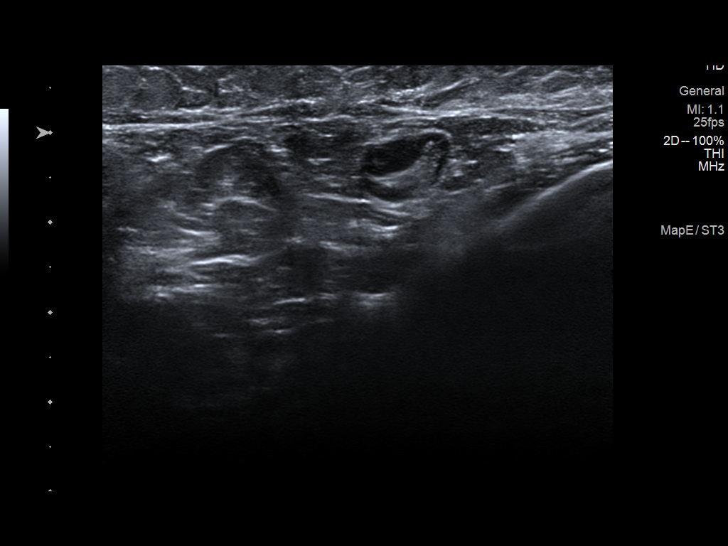
[im 10/10]
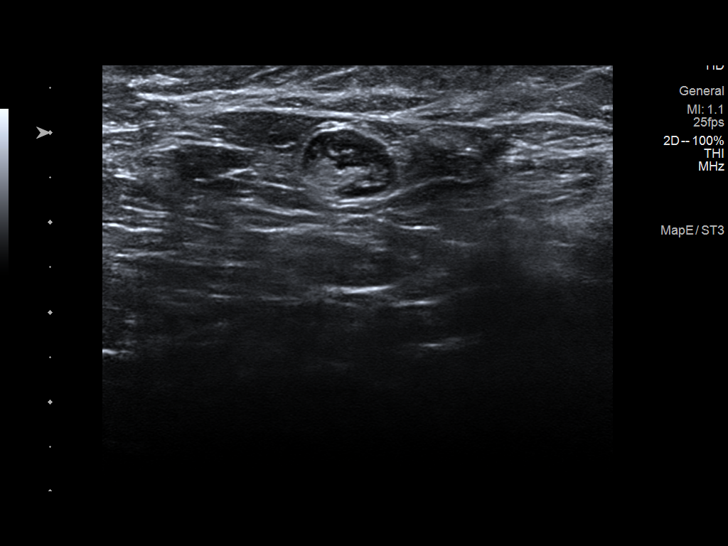

[10 of 10 positions shown; findings below may reference images not displayed]

FINDINGS: On physical exam, no mass or implant contour abnormality is palpable
in either breast.

Targeted ultrasound is performed, showing normal appearing bilateral
breast implants with no extracapsular silicone or implant contour
abnormalities. No abnormal appearing lymph nodes. A 1.4 x 1.0 x
cm oval, circumscribed, normal appearing fat lobule is demonstrated
in the 2 o'clock position of the left breast, 4 cm from the nipple,
with no palpable abnormality at that location.
IMPRESSION: 1. No extracapsular silicone seen on either side.
2. 1.4 cm normal fat lobule or lipoma in the 2 o'clock position of
the left breast.

RECOMMENDATION:
Clinical follow-up.

I have discussed the findings and recommendations with the patient.
If applicable, a reminder letter will be sent to the patient
regarding the next appointment.

BI-RADS CATEGORY  2: Benign.

## 2021-12-09 IMAGING — US US BREAST*R* COMPLETE INC AXILLA
1 series · 9 of 9 positions shown · non-contrast
Comparison: Previous exam(s).

CLINICAL DATA: Lateral bulging of the patient's right breast
implant. Clinical concern for the possibility of implant rupture.
Status post bilateral mastectomy silicone implant reconstruction as
well as right latissimus flap reconstruction.

EXAM:
ULTRASOUND OF THE BILATERAL BREAST

[Series 1: us breast*right* complete inc axilla · 0.07mm/px · 9 of 9 slices shown]
[im 1/9]
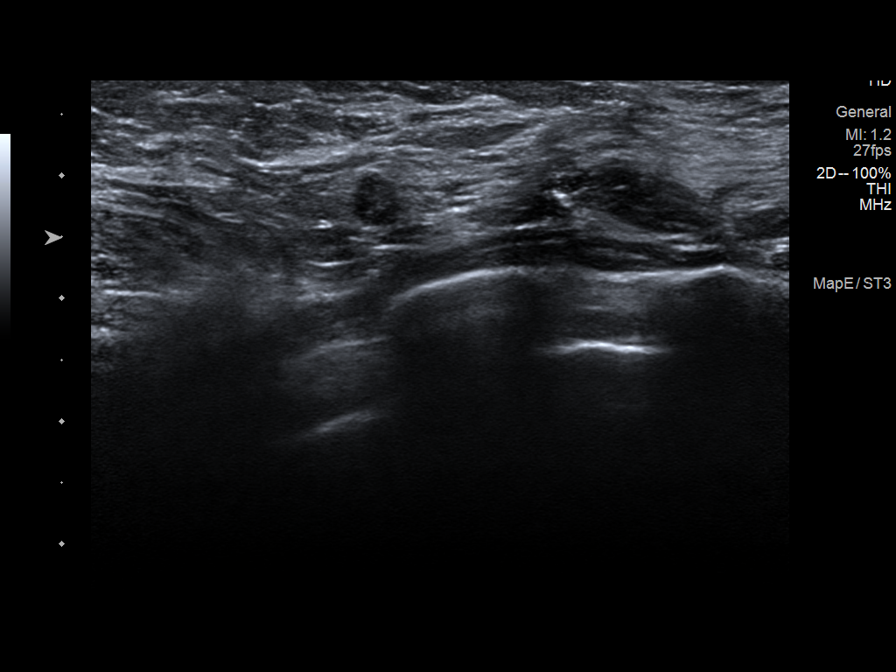
[im 2/9]
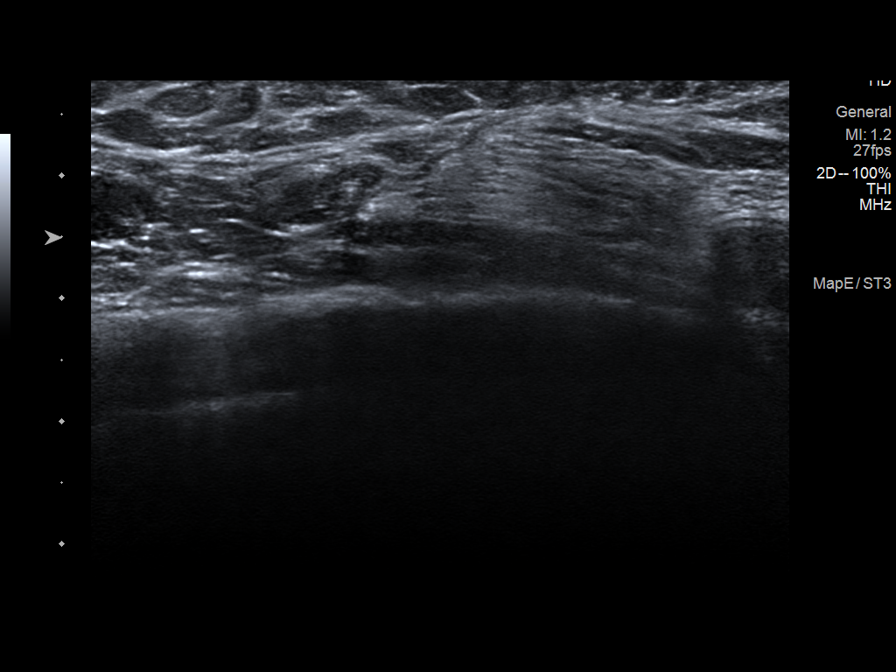
[im 3/9]
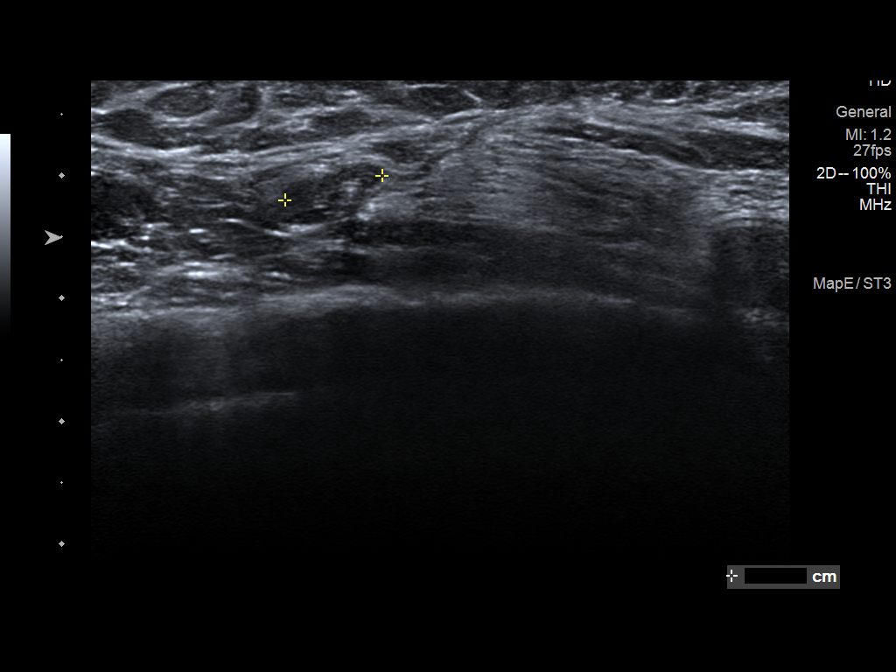
[im 4/9]
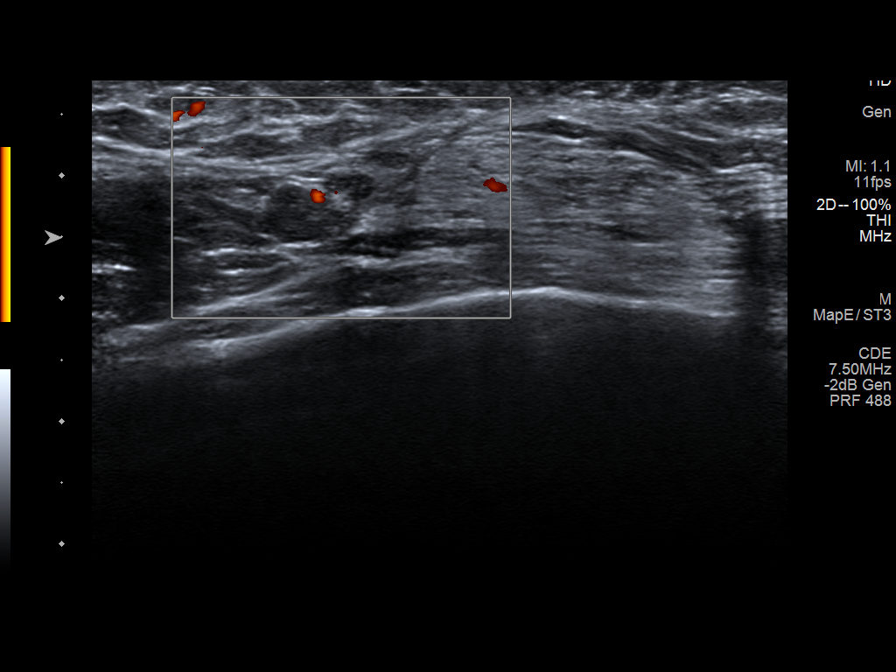
[im 5/9]
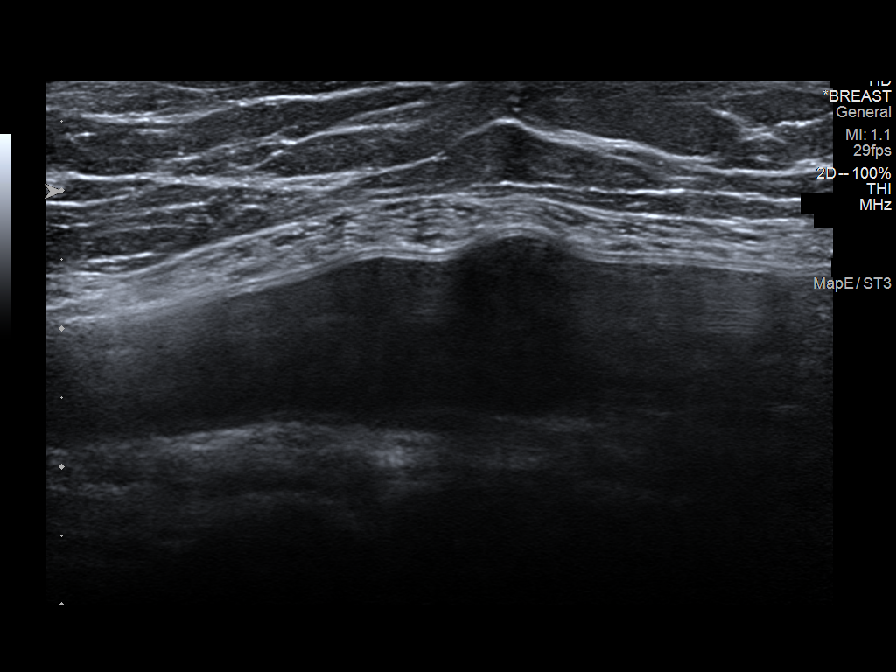
[im 6/9]
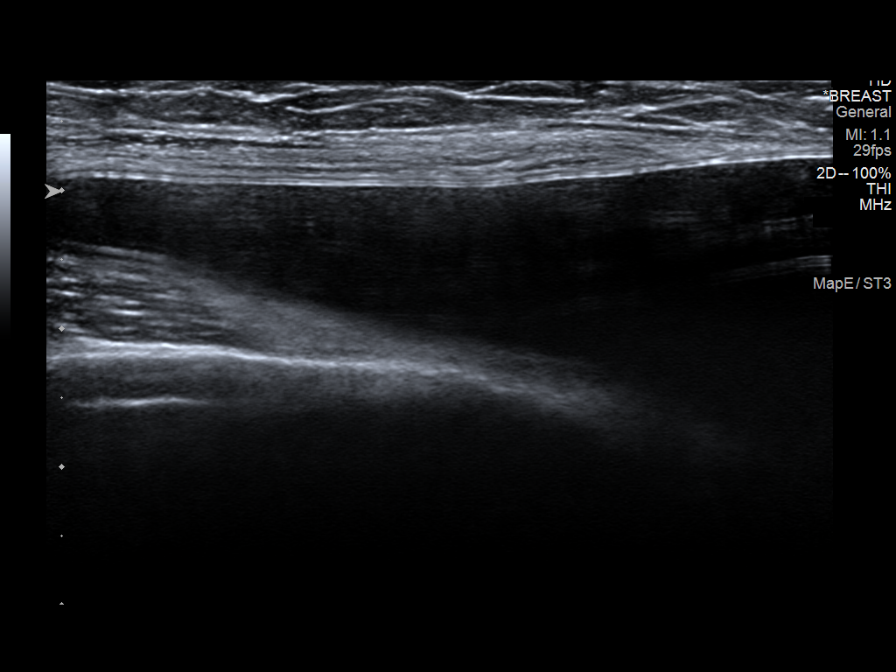
[im 7/9]
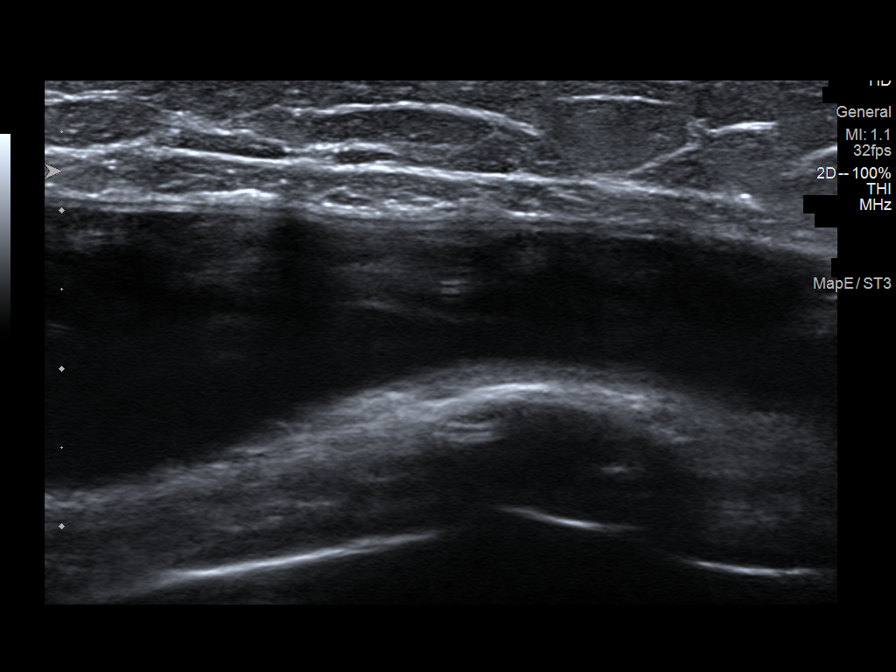
[im 8/9]
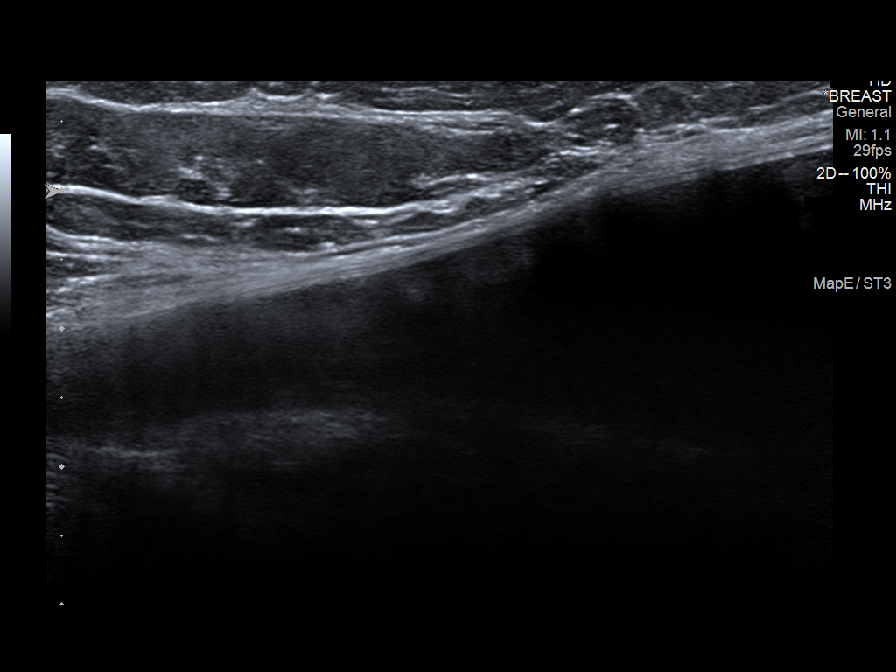
[im 9/9]
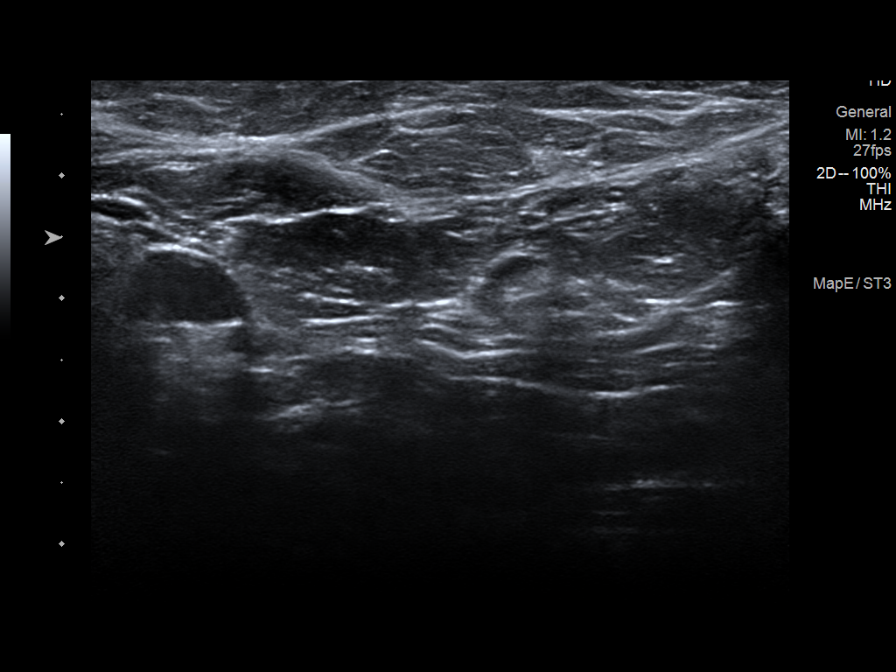

[9 of 9 positions shown; findings below may reference images not displayed]

FINDINGS: On physical exam, no mass or implant contour abnormality is palpable
in either breast.

Targeted ultrasound is performed, showing normal appearing bilateral
breast implants with no extracapsular silicone or implant contour
abnormalities. No abnormal appearing lymph nodes. A 1.4 x 1.0 x
cm oval, circumscribed, normal appearing fat lobule is demonstrated
in the 2 o'clock position of the left breast, 4 cm from the nipple,
with no palpable abnormality at that location.
IMPRESSION: 1. No extracapsular silicone seen on either side.
2. 1.4 cm normal fat lobule or lipoma in the 2 o'clock position of
the left breast.

RECOMMENDATION:
Clinical follow-up.

I have discussed the findings and recommendations with the patient.
If applicable, a reminder letter will be sent to the patient
regarding the next appointment.

BI-RADS CATEGORY  2: Benign.

## 2021-12-18 DIAGNOSIS — Z1211 Encounter for screening for malignant neoplasm of colon: Secondary | ICD-10-CM | POA: Diagnosis not present

## 2021-12-18 LAB — HM COLONOSCOPY

## 2021-12-24 ENCOUNTER — Encounter: Payer: Self-pay | Admitting: Neurology

## 2021-12-31 DIAGNOSIS — G4733 Obstructive sleep apnea (adult) (pediatric): Secondary | ICD-10-CM | POA: Diagnosis not present

## 2022-01-30 DIAGNOSIS — G4733 Obstructive sleep apnea (adult) (pediatric): Secondary | ICD-10-CM | POA: Diagnosis not present

## 2022-03-02 DIAGNOSIS — G4733 Obstructive sleep apnea (adult) (pediatric): Secondary | ICD-10-CM | POA: Diagnosis not present

## 2022-03-30 ENCOUNTER — Encounter: Payer: Self-pay | Admitting: Internal Medicine

## 2022-03-30 ENCOUNTER — Ambulatory Visit (INDEPENDENT_AMBULATORY_CARE_PROVIDER_SITE_OTHER): Payer: Federal, State, Local not specified - PPO | Admitting: Internal Medicine

## 2022-03-30 VITALS — BP 118/80 | HR 56 | Temp 98.6°F | Ht 63.2 in | Wt 157.6 lb

## 2022-03-30 DIAGNOSIS — Z6827 Body mass index (BMI) 27.0-27.9, adult: Secondary | ICD-10-CM | POA: Diagnosis not present

## 2022-03-30 DIAGNOSIS — R7989 Other specified abnormal findings of blood chemistry: Secondary | ICD-10-CM

## 2022-03-30 DIAGNOSIS — I10 Essential (primary) hypertension: Secondary | ICD-10-CM | POA: Diagnosis not present

## 2022-03-30 DIAGNOSIS — Z23 Encounter for immunization: Secondary | ICD-10-CM

## 2022-03-30 LAB — CMP14+EGFR
ALT: 19 [IU]/L (ref 0–32)
AST: 17 [IU]/L (ref 0–40)
Albumin/Globulin Ratio: 1.9 (ref 1.2–2.2)
Albumin: 4.7 g/dL (ref 3.8–4.9)
Alkaline Phosphatase: 79 [IU]/L (ref 44–121)
BUN/Creatinine Ratio: 16 (ref 9–23)
BUN: 15 mg/dL (ref 6–24)
Bilirubin Total: 0.8 mg/dL (ref 0.0–1.2)
CO2: 26 mmol/L (ref 20–29)
Calcium: 10.2 mg/dL (ref 8.7–10.2)
Chloride: 105 mmol/L (ref 96–106)
Creatinine, Ser: 0.91 mg/dL (ref 0.57–1.00)
Globulin, Total: 2.5 g/dL (ref 1.5–4.5)
Glucose: 87 mg/dL (ref 70–99)
Potassium: 4.4 mmol/L (ref 3.5–5.2)
Sodium: 144 mmol/L (ref 134–144)
Total Protein: 7.2 g/dL (ref 6.0–8.5)
eGFR: 75 mL/min/{1.73_m2}

## 2022-03-30 MED ORDER — HYDROCHLOROTHIAZIDE 12.5 MG PO CAPS: ORAL_CAPSULE | ORAL | 1 refills | Status: DC

## 2022-03-30 MED ORDER — ATORVASTATIN CALCIUM 20 MG PO TABS: ORAL_TABLET | ORAL | 2 refills | Status: DC

## 2022-03-30 MED ORDER — METOPROLOL SUCCINATE ER 25 MG PO TB24: ORAL_TABLET | ORAL | 2 refills | Status: DC

## 2022-03-30 NOTE — Progress Notes (Signed)
Rich Brave Llittleton,acting as a Education administrator for Maximino Greenland, MD.,have documented all relevant documentation on the behalf of Maximino Greenland, MD,as directed by  Maximino Greenland, MD while in the presence of Maximino Greenland, MD.    Subjective:     Patient ID: Leslie Salazar , female    DOB: October 08, 1965 , 56 y.o.   MRN: [id number]   Chief Complaint  Patient presents with   Hypertension    HPI  She presents today for BP check. Reports compliance with meds. She is not having any cp, sob or palpitations. She is still exercising regularly with a Physiological scientist.   Hypertension This is a chronic problem. The current episode started more than 1 year ago. The problem has been rapidly improving since onset. The problem is controlled. Pertinent negatives include no blurred vision or orthopnea. Risk factors for coronary artery disease include post-menopausal state. The current treatment provides moderate improvement. There are no compliance problems.      Past Medical History:  Diagnosis Date   Acne    Cancer ([place]) 2012   RIGHT DUCTAL CARCINOMA INSITU.[facility] Kitchen   Complication of anesthesia    Condyloma    ON BUTTOCKS   DCIS (ductal carcinoma in situ)    BRCA 1and 2 Neg./Pos ER, Pos PR   Fibroids    Heart palpitations    High cholesterol    History of angina    HSV-1 (herpes simplex virus 1) infection    Hx of radiation therapy 2013   from april to may - six weeks   Hypertension    Left ovarian cyst    PONV (postoperative nausea and vomiting)    nausea no vomiting   Scoliosis    Sleep apnea    c pap   Urinary urgency    Wears glasses      Family History  Problem Relation Age of Onset   Diabetes Mother        HAS HAD A KIDNEY TRANSPLATN   Hypertension Mother    Kidney failure Mother    Hypertension Father    Gallstones Father    Hypertension Sister    Gallstones Sister    Hypertension Sister    Colon cancer Paternal Aunt    Lung cancer Maternal Grandfather      Current  Outpatient Medications:    aspirin EC 81 MG tablet, Take 81 mg by mouth every morning., Disp: , Rfl:    Cholecalciferol 25 MCG (1000 UT) tablet, Take 1 tablet by mouth daily., Disp: , Rfl:    Coenzyme Q10 100 MG capsule, Take 1 capsule by mouth daily., Disp: , Rfl:    Estradiol 10 MCG TABS vaginal tablet, Place 1 tablet (10 mcg total) vaginally once a week., Disp: 12 tablet, Rfl: 4   hydrocortisone (ANUSOL-HC) 2.5 % rectal cream, Place 1 application rectally 2 (two) times daily. (Patient taking differently: Place 1 application  rectally as needed.), Disp: 30 g, Rfl: 3   loratadine (CLARITIN) 10 MG tablet, Take 10 mg by mouth every morning. , Disp: , Rfl:    montelukast (SINGULAIR) 10 MG tablet, TAKE 1 TABLET(10 MG) BY MOUTH DAILY, Disp: 90 tablet, Rfl: 2   Multiple Vitamins-Minerals (MULTIVITAMIN WOMENS 50+ ADV PO), Take by mouth., Disp: , Rfl:    Omega-3 Fatty Acids (FISH OIL PO), Take by mouth. 1 capsule daily, Disp: , Rfl:    ondansetron (ZOFRAN) 4 MG tablet, Take 1 tablet (4 mg total) by mouth every 8 (eight) hours  as needed for nausea or vomiting., Disp: 20 tablet, Rfl: 0   traZODone (DESYREL) 50 MG tablet, TAKE 1 TABLET(50 MG) BY MOUTH AT BEDTIME AS NEEDED FOR SLEEP, Disp: 90 tablet, Rfl: 1   valACYclovir (VALTREX) 500 MG tablet, Take 1 tablet (500 mg total) by mouth daily., Disp: 90 tablet, Rfl: 4   atorvastatin (LIPITOR) 20 MG tablet, TAKE 1 TABLET(20 MG) BY MOUTH DAILY, Disp: 90 tablet, Rfl: 2   hydrochlorothiazide (MICROZIDE) 12.5 MG capsule, TAKE 1 CAPSULE(12.5 MG) BY MOUTH DAILY, Disp: 90 capsule, Rfl: 1   metoprolol succinate (TOPROL-XL) 25 MG 24 hr tablet, TAKE 1 TABLET(25 MG) BY MOUTH DAILY, Disp: 90 tablet, Rfl: 2   No Known Allergies   Review of Systems  Constitutional: Negative.   Eyes: Negative.  Negative for blurred vision.  Respiratory: Negative.    Cardiovascular: Negative.  Negative for orthopnea.  Musculoskeletal: Negative.   Skin: Negative.   Psychiatric/Behavioral:  Negative.       Today's Vitals   03/30/22 0836  BP: 118/80  Pulse: (!) 56  Temp: 98.6 F (37 C)  Weight: 157 lb 9.6 oz (71.5 kg)  Height: 5' 3.2" (1.605 m)  PainSc: 0-No pain   Body mass index is 27.74 kg/m.  Wt Readings from Last 3 Encounters:  03/30/22 157 lb 9.6 oz (71.5 kg)  12/03/21 158 lb 8 oz (71.9 kg)  09/28/21 157 lb (71.2 kg)     Objective:  Physical Exam Vitals and nursing note reviewed.  Constitutional:      Appearance: Normal appearance.  HENT:     Head: Normocephalic and atraumatic.     Nose:     Comments: Masked     Mouth/Throat:     Comments: Masked  Eyes:     Extraocular Movements: Extraocular movements intact.  Cardiovascular:     Rate and Rhythm: Normal rate and regular rhythm.     Heart sounds: Normal heart sounds.  Pulmonary:     Effort: Pulmonary effort is normal.     Breath sounds: Normal breath sounds.  Musculoskeletal:     Cervical back: Normal range of motion.  Skin:    General: Skin is warm.  Neurological:     General: No focal deficit present.     Mental Status: She is alert.  Psychiatric:        Mood and Affect: Mood normal.        Behavior: Behavior normal.       Assessment And Plan:     1. Essential hypertension, benign Comments: Chronic, well controlled. She will c/w HCTZ 12.24m and metoprolol XL daily. I will check renal function today.  She will rto in six months for a full physical examination.  - CMP14+EGFR  2. Elevated LFTs Comments: In June, ALT was 41. I will recheck LFTs today. I will make further recommendations once her labs are available for review.  3. Adult BMI 27.0-27.9 kg/sq m Comments: She is encouraged to aim for at least 150 minutes of exercise per week.  4. Immunization due - Tdap vaccine greater than or equal to 7yo IM   Patient was given opportunity to ask questions. Patient verbalized understanding of the plan and was able to repeat key elements of the plan. All questions were answered to their  satisfaction.   I, RMaximino Greenland MD, have reviewed all documentation for this visit. The documentation on 03/30/22 for the exam, diagnosis, procedures, and orders are all accurate and complete.   IF YOU HAVE BEEN REFERRED  TO A SPECIALIST, IT MAY TAKE 1-2 WEEKS TO SCHEDULE/PROCESS THE REFERRAL. IF YOU HAVE NOT HEARD FROM US/SPECIALIST IN TWO WEEKS, PLEASE GIVE [place] A CALL AT [phone].   THE PATIENT IS ENCOURAGED TO PRACTICE SOCIAL DISTANCING DUE TO THE COVID-19 PANDEMIC.

## 2022-03-30 NOTE — Patient Instructions (Signed)
Hypertension, Adult ?Hypertension is another name for high blood pressure. High blood pressure forces your heart to work harder to pump blood. This can cause problems over time. ?There are two numbers in a blood pressure reading. There is a top number (systolic) over a bottom number (diastolic). It is best to have a blood pressure that is below 120/80. ?What are the causes? ?The cause of this condition is not known. Some other conditions can lead to high blood pressure. ?What increases the risk? ?Some lifestyle factors can make you more likely to develop high blood pressure: ?Smoking. ?Not getting enough exercise or physical activity. ?Being overweight. ?Having too much fat, sugar, calories, or salt (sodium) in your diet. ?Drinking too much alcohol. ?Other risk factors include: ?Having any of these conditions: ?Heart disease. ?Diabetes. ?High cholesterol. ?Kidney disease. ?Obstructive sleep apnea. ?Having a family history of high blood pressure and high cholesterol. ?Age. The risk increases with age. ?Stress. ?What are the signs or symptoms? ?High blood pressure may not cause symptoms. Very high blood pressure (hypertensive crisis) may cause: ?Headache. ?Fast or uneven heartbeats (palpitations). ?Shortness of breath. ?Nosebleed. ?Vomiting or feeling like you may vomit (nauseous). ?Changes in how you see. ?Very bad chest pain. ?Feeling dizzy. ?Seizures. ?How is this treated? ?This condition is treated by making healthy lifestyle changes, such as: ?Eating healthy foods. ?Exercising more. ?Drinking less alcohol. ?Your doctor may prescribe medicine if lifestyle changes do not help enough and if: ?Your top number is above 130. ?Your bottom number is above 80. ?Your personal target blood pressure may vary. ?Follow these instructions at home: ?Eating and drinking ? ?If told, follow the DASH eating plan. To follow this plan: ?Fill one half of your plate at each meal with fruits and vegetables. ?Fill one fourth of your plate  at each meal with whole grains. Whole grains include whole-wheat pasta, brown rice, and whole-grain bread. ?Eat or drink low-fat dairy products, such as skim milk or low-fat yogurt. ?Fill one fourth of your plate at each meal with low-fat (lean) proteins. Low-fat proteins include fish, chicken without skin, eggs, beans, and tofu. ?Avoid fatty meat, cured and processed meat, or chicken with skin. ?Avoid pre-made or processed food. ?Limit the amount of salt in your diet to less than 1,500 mg each day. ?Do not drink alcohol if: ?Your doctor tells you not to drink. ?You are pregnant, may be pregnant, or are planning to become pregnant. ?If you drink alcohol: ?Limit how much you have to: ?0-1 drink a day for women. ?0-2 drinks a day for men. ?Know how much alcohol is in your drink. In the [place] one drink equals one 12 oz bottle of beer (355 mL), one 5 oz glass of wine (148 mL), or one 1? oz glass of hard liquor (44 mL). ?Lifestyle ? ?Work with your doctor to stay at a healthy weight or to lose weight. Ask your doctor what the best weight is for you. ?Get at least 30 minutes of exercise that causes your heart to beat faster (aerobic exercise) most days of the week. This may include walking, swimming, or biking. ?Get at least 30 minutes of exercise that strengthens your muscles (resistance exercise) at least 3 days a week. This may include lifting weights or doing Pilates. ?Do not smoke or use any products that contain nicotine or tobacco. If you need help quitting, ask your doctor. ?Check your blood pressure at home as told by your doctor. ?Keep all follow-up visits. ?Medicines ?Take over-the-counter and prescription medicines   only as told by your doctor. Follow directions carefully. ?Do not skip doses of blood pressure medicine. The medicine does not work as well if you skip doses. Skipping doses also puts you at risk for problems. ?Ask your doctor about side effects or reactions to medicines that you should watch  for. ?Contact a doctor if: ?You think you are having a reaction to the medicine you are taking. ?You have headaches that keep coming back. ?You feel dizzy. ?You have swelling in your ankles. ?You have trouble with your vision. ?Get help right away if: ?You get a very bad headache. ?You start to feel mixed up (confused). ?You feel weak or numb. ?You feel faint. ?You have very bad pain in your: ?Chest. ?Belly (abdomen). ?You vomit more than once. ?You have trouble breathing. ?These symptoms may be an emergency. Get help right away. Call 911. ?Do not wait to see if the symptoms will go away. ?Do not drive yourself to the hospital. ?Summary ?Hypertension is another name for high blood pressure. ?High blood pressure forces your heart to work harder to pump blood. ?For most people, a normal blood pressure is less than 120/80. ?Making healthy choices can help lower blood pressure. If your blood pressure does not get lower with healthy choices, you may need to take medicine. ?This information is not intended to replace advice given to you by your health care provider. Make sure you discuss any questions you have with your health care provider. ?Document Revised: 01/15/2021 Document Reviewed: 01/15/2021 ?Elsevier Patient Education ? 2023 [employer] ? ?

## 2022-03-31 DIAGNOSIS — G4733 Obstructive sleep apnea (adult) (pediatric): Secondary | ICD-10-CM | POA: Diagnosis not present

## 2022-04-01 DIAGNOSIS — G4733 Obstructive sleep apnea (adult) (pediatric): Secondary | ICD-10-CM | POA: Diagnosis not present

## 2022-05-01 DIAGNOSIS — G4733 Obstructive sleep apnea (adult) (pediatric): Secondary | ICD-10-CM | POA: Diagnosis not present

## 2022-05-07 DIAGNOSIS — G4733 Obstructive sleep apnea (adult) (pediatric): Secondary | ICD-10-CM | POA: Diagnosis not present

## 2022-05-07 DIAGNOSIS — Z170 Estrogen receptor positive status [ER+]: Secondary | ICD-10-CM | POA: Diagnosis not present

## 2022-05-07 DIAGNOSIS — N898 Other specified noninflammatory disorders of vagina: Secondary | ICD-10-CM | POA: Diagnosis not present

## 2022-05-07 DIAGNOSIS — Z9189 Other specified personal risk factors, not elsewhere classified: Secondary | ICD-10-CM | POA: Diagnosis not present

## 2022-05-07 DIAGNOSIS — C50111 Malignant neoplasm of central portion of right female breast: Secondary | ICD-10-CM | POA: Diagnosis not present

## 2022-05-07 DIAGNOSIS — I10 Essential (primary) hypertension: Secondary | ICD-10-CM | POA: Diagnosis not present

## 2022-05-29 DIAGNOSIS — R509 Fever, unspecified: Secondary | ICD-10-CM | POA: Diagnosis not present

## 2022-05-29 DIAGNOSIS — R059 Cough, unspecified: Secondary | ICD-10-CM | POA: Diagnosis not present

## 2022-05-29 DIAGNOSIS — R0789 Other chest pain: Secondary | ICD-10-CM | POA: Diagnosis not present

## 2022-05-29 DIAGNOSIS — Z8701 Personal history of pneumonia (recurrent): Secondary | ICD-10-CM | POA: Diagnosis not present

## 2022-05-29 DIAGNOSIS — R5383 Other fatigue: Secondary | ICD-10-CM | POA: Diagnosis not present

## 2022-05-31 ENCOUNTER — Ambulatory Visit: Payer: Federal, State, Local not specified - PPO | Admitting: Adult Health

## 2022-06-01 DIAGNOSIS — G4733 Obstructive sleep apnea (adult) (pediatric): Secondary | ICD-10-CM | POA: Diagnosis not present

## 2022-06-02 ENCOUNTER — Ambulatory Visit: Payer: Federal, State, Local not specified - PPO | Admitting: Adult Health

## 2022-06-29 DIAGNOSIS — G4733 Obstructive sleep apnea (adult) (pediatric): Secondary | ICD-10-CM | POA: Diagnosis not present

## 2022-07-01 DIAGNOSIS — G4733 Obstructive sleep apnea (adult) (pediatric): Secondary | ICD-10-CM | POA: Diagnosis not present

## 2022-07-30 DIAGNOSIS — G4733 Obstructive sleep apnea (adult) (pediatric): Secondary | ICD-10-CM | POA: Diagnosis not present

## 2022-08-29 DIAGNOSIS — G4733 Obstructive sleep apnea (adult) (pediatric): Secondary | ICD-10-CM | POA: Diagnosis not present

## 2022-09-17 ENCOUNTER — Ambulatory Visit (INDEPENDENT_AMBULATORY_CARE_PROVIDER_SITE_OTHER): Payer: Federal, State, Local not specified - PPO | Admitting: Obstetrics & Gynecology

## 2022-09-17 ENCOUNTER — Encounter: Payer: Self-pay | Admitting: Obstetrics & Gynecology

## 2022-09-17 VITALS — BP 104/70 | HR 64 | Ht 62.25 in | Wt 157.0 lb

## 2022-09-17 DIAGNOSIS — N952 Postmenopausal atrophic vaginitis: Secondary | ICD-10-CM | POA: Diagnosis not present

## 2022-09-17 DIAGNOSIS — Z90722 Acquired absence of ovaries, bilateral: Secondary | ICD-10-CM

## 2022-09-17 DIAGNOSIS — B009 Herpesviral infection, unspecified: Secondary | ICD-10-CM | POA: Diagnosis not present

## 2022-09-17 DIAGNOSIS — Z01419 Encounter for gynecological examination (general) (routine) without abnormal findings: Secondary | ICD-10-CM

## 2022-09-17 DIAGNOSIS — C50911 Malignant neoplasm of unspecified site of right female breast: Secondary | ICD-10-CM

## 2022-09-17 DIAGNOSIS — Z170 Estrogen receptor positive status [ER+]: Secondary | ICD-10-CM

## 2022-09-17 DIAGNOSIS — Z780 Asymptomatic menopausal state: Secondary | ICD-10-CM

## 2022-09-17 DIAGNOSIS — Z8619 Personal history of other infectious and parasitic diseases: Secondary | ICD-10-CM

## 2022-09-17 NOTE — Progress Notes (Signed)
Leslie Salazar [facility] [birth year] [id number]   History:    57 y.o. G1P1L1  Married, now separated.   RP:  Established patient presenting for annual gyn exam    HPI:  Postmenopause, well on no HRT, no PMB.  No pelvic pain.  S/P BSO 08/2018. Patho benign.  Rt Breast Infiltrating Ductal Ca, ER-PR positive.  Finished Tamoxifen >4 yrs ago.  BrCa1-2 negative. Had bilateral mastectomy/breast reconstruction with tattoos.  Bilateral Breast US in 05/2019.  Using Vagifem and Vit E in vagina.  Will stop Vagifem, abstinent.  Replens suggested for dryness. Pap Neg 08/2021. Repeat at 3 years.  Condyloma treated on buttocks last year.  Has been treated with Aldara for perianal wart in the past.  Valtrex prophylaxis, no recurrence, will stop and observe. BMI 28.49.  Gym with trainer 2 times a week and walking 5-7 times a week. Health labs with Fam MD.  Alen Bleacher 12/2021. BMD normal on 10/2021.   Past medical history,surgical history, family history and social history were all reviewed and documented in the EPIC chart.  Gynecologic History No LMP recorded. Patient is postmenopausal.  Obstetric History OB History  Gravida Para Term Preterm AB Living  1 1 1     1   SAB IAB Ectopic Multiple Live Births          1    # Outcome Date GA Lbr Len/2nd Weight Sex Delivery Anes PTL Lv  1 Term     M CS-Unspec  N LIV     ROS: A ROS was performed and pertinent positives and negatives are included in the history. GENERAL: No fevers or chills. HEENT: No change in vision, no earache, sore throat or sinus congestion. NECK: No pain or stiffness. CARDIOVASCULAR: No chest pain or pressure. No palpitations. PULMONARY: No shortness of breath, cough or wheeze. GASTROINTESTINAL: No abdominal pain, nausea, vomiting or diarrhea, melena or bright red blood per rectum. GENITOURINARY: No urinary frequency, urgency, hesitancy or dysuria. MUSCULOSKELETAL: No joint or muscle pain, no back pain, no recent trauma. DERMATOLOGIC: No rash, no itching, no  lesions. ENDOCRINE: No polyuria, polydipsia, no heat or cold intolerance. No recent change in weight. HEMATOLOGICAL: No anemia or easy bruising or bleeding. NEUROLOGIC: No headache, seizures, numbness, tingling or weakness. PSYCHIATRIC: No depression, no loss of interest in normal activity or change in sleep pattern.     Exam:   BP 104/70   Pulse 64   Ht 5' 2.25" (1.581 m)   Wt 157 lb (71.2 kg)   SpO2 99%   BMI 28.49 kg/m   Body mass index is 28.49 kg/m.  General appearance : Well developed well nourished female. No acute distress HEENT: Eyes: no retinal hemorrhage or exudates,  Neck supple, trachea midline, no carotid bruits, no thyroidmegaly Lungs: Clear to auscultation, no rhonchi or wheezes, or rib retractions  Heart: Regular rate and rhythm, no murmurs or gallops Breast:Examined in sitting and supine position were symmetrical in appearance, no palpable masses or tenderness,  no skin retraction, no nipple inversion, no nipple discharge, no skin discoloration, no axillary or supraclavicular lymphadenopathy Abdomen: no palpable masses or tenderness, no rebound or guarding Extremities: no edema or skin discoloration or tenderness  Pelvic: Vulva: Normal             Vagina: No gross lesions or discharge  Cervix: No gross lesions or discharge  Uterus  AV, normal size, shape and consistency, non-tender and mobile  Adnexa  Without masses or tenderness  Anus: Normal   Assessment/Plan:  57 y.o. female for annual exam   1. Well female exam with routine gynecological exam Postmenopause, well on no HRT, no PMB.  No pelvic pain.  S/P BSO 08/2018. Patho benign.  Rt Breast Infiltrating Ductal Ca, ER-PR positive.  Finished Tamoxifen >4 yrs ago.  BrCa1-2 negative. Had bilateral mastectomy/breast reconstruction with tattoos.  Bilateral Breast US in 05/2019.  Using Vagifem and Vit E in vagina.  Will stop Vagifem, abstinent.  Replens suggested for dryness. Pap Neg 08/2021. Repeat at 3 years.   Condyloma treated on buttocks last year.  Has been treated with Aldara for perianal wart in the past.  Valtrex prophylaxis, no recurrence, will stop and observe. BMI 28.49. Gym with trainer 2 times a week and walking 5-7 times a week. Health labs with Fam MD.  Alen Bleacher 12/2021. BMD normal on 10/2021.   2. S/P BSO (bilateral salpingo-oophorectomy)  3. Postmenopausal Postmenopause, well on no HRT, no PMB.  No pelvic pain.  S/P BSO 08/2018. Patho benign.    4. Postmenopausal atrophic vaginitis Using Vagifem and Vit E in vagina.  Will stop Vagifem, abstinent.  Replens suggested for dryness.   5. Infiltrating ductal carcinoma of right breast, estrogen receptor positive, stage 1 (HCC) Had bilateral mastectomy/breast reconstruction with tattoos.  Bilateral Breast US in 05/2019.    6. H/O herpes genitalis   Valtrex prophylaxis, no recurrence, will stop and observe.  Genia Del MD, 9:25 AM

## 2022-09-27 DIAGNOSIS — G4733 Obstructive sleep apnea (adult) (pediatric): Secondary | ICD-10-CM | POA: Diagnosis not present

## 2022-10-01 DIAGNOSIS — G4733 Obstructive sleep apnea (adult) (pediatric): Secondary | ICD-10-CM | POA: Diagnosis not present

## 2022-10-11 ENCOUNTER — Encounter: Payer: Self-pay | Admitting: Internal Medicine

## 2022-10-11 ENCOUNTER — Ambulatory Visit (INDEPENDENT_AMBULATORY_CARE_PROVIDER_SITE_OTHER): Payer: Federal, State, Local not specified - PPO | Admitting: Internal Medicine

## 2022-10-11 VITALS — BP 110/80 | HR 54 | Ht 62.25 in | Wt 159.2 lb

## 2022-10-11 DIAGNOSIS — Z0000 Encounter for general adult medical examination without abnormal findings: Secondary | ICD-10-CM | POA: Diagnosis not present

## 2022-10-11 DIAGNOSIS — Z6828 Body mass index (BMI) 28.0-28.9, adult: Secondary | ICD-10-CM

## 2022-10-11 DIAGNOSIS — Z0001 Encounter for general adult medical examination with abnormal findings: Secondary | ICD-10-CM | POA: Diagnosis not present

## 2022-10-11 DIAGNOSIS — E663 Overweight: Secondary | ICD-10-CM | POA: Diagnosis not present

## 2022-10-11 DIAGNOSIS — I10 Essential (primary) hypertension: Secondary | ICD-10-CM

## 2022-10-11 DIAGNOSIS — L989 Disorder of the skin and subcutaneous tissue, unspecified: Secondary | ICD-10-CM

## 2022-10-11 DIAGNOSIS — Z853 Personal history of malignant neoplasm of breast: Secondary | ICD-10-CM

## 2022-10-11 LAB — POCT URINALYSIS DIPSTICK
Bilirubin, UA: NEGATIVE
Blood, UA: NEGATIVE
Glucose, UA: NEGATIVE
Ketones, UA: NEGATIVE
Leukocytes, UA: NEGATIVE
Nitrite, UA: NEGATIVE
Protein, UA: NEGATIVE
Spec Grav, UA: >=1.03 — AB (ref 1.010–1.025)
Urobilinogen, UA: 0.2 E.U./dL
pH, UA: 6 (ref 5.0–8.0)

## 2022-10-11 NOTE — Progress Notes (Signed)
Subjective:    Patient ID: Leslie Salazar , female    DOB: [birth year] , 57 y.o.   MRN: [id number]  Chief Complaint  Patient presents with  . Annual Exam    HPI  She is here today for a full physical exam. She is followed by Verita Schneiders, MD GYN for her pelvic exams. She reports compliance with meds. She denies having any headaches, chest pain and shortness of breath.  Patient would like a referral to dermatology due to a spot on her face that keeps reappearing.  Hypertension This is a chronic problem. The current episode started more than 1 year ago. The problem has been rapidly improving since onset. The problem is controlled. Pertinent negatives include no blurred vision, neck pain or orthopnea. Risk factors for coronary artery disease include post-menopausal state. The current treatment provides moderate improvement. There are no compliance problems.      Past Medical History:  Diagnosis Date  . Acne   . Cancer (HCC) 2012   RIGHT DUCTAL CARCINOMA INSITU..  . Complication of anesthesia   . Condyloma    ON BUTTOCKS  . 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 Fibroids   . Heart palpitations   . High cholesterol   . History of angina   . HSV-1 (herpes simplex virus 1) infection   . Hx of radiation therapy 2013   from april to may - six weeks  . Hypertension   . Left ovarian cyst   . PONV (postoperative nausea and vomiting)    nausea no vomiting  . Scoliosis   . Sleep apnea    c pap  . Urinary urgency   . Wears glasses      Family History  Problem Relation Age of Onset  . Diabetes Mother        HAS HAD A KIDNEY TRANSPLATN  . Hypertension Mother   . Kidney failure Mother   . Hypertension Father   . Gallstones Father   . Hypertension Sister   . Gallstones Sister   . Hypertension Sister   . Colon cancer Paternal Aunt   . Lung cancer Maternal Grandfather      Current Outpatient Medications:  .  aspirin EC 81 MG tablet, Take 81  mg by mouth every morning., Disp: , Rfl:  .  atorvastatin (LIPITOR) 20 MG tablet, TAKE 1 TABLET(20 MG) BY MOUTH DAILY, Disp: 90 tablet, Rfl: 2 .  Cholecalciferol 25 MCG (1000 UT) tablet, Take 1 tablet by mouth daily., Disp: , Rfl:  .  Coenzyme Q10 100 MG capsule, Take 1 capsule by mouth daily., Disp: , Rfl:  .  hydrochlorothiazide (MICROZIDE) 12.5 MG capsule, TAKE 1 CAPSULE(12.5 MG) BY MOUTH DAILY, Disp: 90 capsule, Rfl: 1 .  hydrocortisone (ANUSOL-HC) 2.5 % rectal cream, Place 1 application rectally 2 (two) times daily., Disp: 30 g, Rfl: 3 .  loratadine (CLARITIN) 10 MG tablet, Take 10 mg by mouth every morning. , Disp: , Rfl:  .  metoprolol succinate (TOPROL-XL) 25 MG 24 hr tablet, TAKE 1 TABLET(25 MG) BY MOUTH DAILY, Disp: 90 tablet, Rfl: 2 .  montelukast (SINGULAIR) 10 MG tablet, TAKE 1 TABLET(10 MG) BY MOUTH DAILY, Disp: 90 tablet, Rfl: 2 .  Multiple Vitamins-Minerals (MULTIVITAMIN WOMENS 50+ ADV PO), Take by mouth., Disp: , Rfl:  .  Omega-3 Fatty Acids (FISH OIL PO), Take by mouth. 1 capsule daily, Disp: , Rfl:  .  ondansetron (ZOFRAN) 4 MG  tablet, Take 1 tablet (4 mg total) by mouth every 8 (eight) hours as needed for nausea or vomiting., Disp: 20 tablet, Rfl: 0 .  traZODone (DESYREL) 50 MG tablet, TAKE 1 TABLET(50 MG) BY MOUTH AT BEDTIME AS NEEDED FOR SLEEP, Disp: 90 tablet, Rfl: 1   No Known Allergies    The patient states she uses {contraceptive methods:5051} for birth control. No LMP recorded. Patient is postmenopausal.. {Dysmenorrhea-menorrhagia:21918}. Negative for: breast discharge, breast lump(s), breast pain and breast self exam. Associated symptoms include abnormal vaginal bleeding. Pertinent negatives include abnormal bleeding (hematology), anxiety, decreased libido, depression, difficulty falling sleep, dyspareunia, history of infertility, nocturia, sexual dysfunction, sleep disturbances, urinary incontinence, urinary urgency, vaginal discharge and vaginal itching. Diet  regular.The patient states her exercise level is    . The patient's tobacco use is:  Social History   Tobacco Use  Smoking Status Never  Smokeless Tobacco Never  . She has been exposed to passive smoke. The patient's alcohol use is:  Social History   Substance and Sexual Activity  Alcohol Use Yes  . Alcohol/week: 1.0 standard drink of alcohol  . Types: 1 Glasses of wine per week  . Additional information: Last pap ***, next one scheduled for ***.    Review of Systems  Constitutional: Negative.   HENT: Negative.    Eyes: Negative.  Negative for blurred vision.  Respiratory: Negative.    Cardiovascular: Negative.  Negative for orthopnea.  Gastrointestinal: Negative.   Endocrine: Negative.   Genitourinary: Negative.   Musculoskeletal: Negative.  Negative for neck pain.  Skin: Negative.   Allergic/Immunologic: Negative.   Neurological: Negative.   Hematological: Negative.   Psychiatric/Behavioral: Negative.       Today's Vitals   10/11/22 1019  BP: 110/80  Pulse: (!) 54  Weight: 159 lb 3.2 oz (72.2 kg)  Height: 5' 2.25" (1.581 m)  PainSc: 0-No pain   Body mass index is 28.88 kg/m.  Wt Readings from Last 3 Encounters:  10/11/22 159 lb 3.2 oz (72.2 kg)  09/17/22 157 lb (71.2 kg)  03/30/22 157 lb 9.6 oz (71.5 kg)     Objective:  Physical Exam Vitals and nursing note reviewed.  Constitutional:      Appearance: Normal appearance.  HENT:     Head: Normocephalic and atraumatic.     Right Ear: Tympanic membrane, ear canal and external ear normal.     Left Ear: Tympanic membrane, ear canal and external ear normal.     Nose: Nose normal.     Mouth/Throat:     Mouth: Mucous membranes are moist.     Pharynx: Oropharynx is clear.  Eyes:     Extraocular Movements: Extraocular movements intact.     Conjunctiva/sclera: Conjunctivae normal.     Pupils: Pupils are equal, round, and reactive to light.  Cardiovascular:     Rate and Rhythm: Normal rate and regular rhythm.      Pulses: Normal pulses.     Heart sounds: Normal heart sounds.  Pulmonary:     Effort: Pulmonary effort is normal.     Breath sounds: Normal breath sounds.  Chest:  Breasts:    Right: Absent.     Left: Absent.     Comments: S/p b/l mastectomy w/ reconstructive implant surgery Tattoos b/l breasts No axillary adenopathy Abdominal:     General: Abdomen is flat. Bowel sounds are normal.     Palpations: Abdomen is soft.  Genitourinary:    Comments: deferred Musculoskeletal:  General: Normal range of motion.     Cervical back: Normal range of motion and neck supple.  Skin:    General: Skin is warm and dry.     Comments: Hyperpigmented papular lesion left jaw  Neurological:     General: No focal deficit present.     Mental Status: She is alert and oriented to person, place, and time.  Psychiatric:        Mood and Affect: Mood normal.        Behavior: Behavior normal.        Assessment And Plan:     Encounter for general adult medical examination w/o abnormal findings Assessment & Plan: A full exam was performed. She is s/p b/l mastectomy. PATIENT IS ADVISED TO GET 30-45 MINUTES REGULAR EXERCISE NO LESS THAN FOUR TO FIVE DAYS PER WEEK - BOTH WEIGHTBEARING EXERCISES AND AEROBIC ARE RECOMMENDED.  PATIENT IS ADVISED TO FOLLOW A HEALTHY DIET WITH AT LEAST SIX FRUITS/VEGGIES PER DAY, DECREASE INTAKE OF RED MEAT, AND TO INCREASE FISH INTAKE TO TWO DAYS PER WEEK.  MEATS/FISH SHOULD NOT BE FRIED, BAKED OR BROILED IS PREFERABLE.  IT IS ALSO IMPORTANT TO CUT BACK ON YOUR SUGAR INTAKE. PLEASE AVOID ANYTHING WITH ADDED SUGAR, CORN SYRUP OR OTHER SWEETENERS. IF YOU MUST USE A SWEETENER, YOU CAN TRY STEVIA. IT IS ALSO IMPORTANT TO AVOID ARTIFICIALLY SWEETENERS AND DIET BEVERAGES. LASTLY, I SUGGEST WEARING SPF 50 SUNSCREEN ON EXPOSED PARTS AND ESPECIALLY WHEN IN THE DIRECT SUNLIGHT FOR AN EXTENDED PERIOD OF TIME.  PLEASE AVOID FAST FOOD RESTAURANTS AND INCREASE YOUR WATER INTAKE.      Orders: -     CBC -     Lipid panel -     CMP14+EGFR -     TSH  Essential hypertension, benign Assessment & Plan: Chronic, well controlled. EKG performed, SB w/ voltage criteria for LVH. She will c/w HCTZ 12.5mg  and metoprolol XL 25mg  daily. F/u 6 months.   Orders: -     POCT urinalysis dipstick -     Microalbumin / creatinine urine ratio -     EKG 12-Lead  Skin lesion of face -     Ambulatory referral to Dermatology  Overweight with body mass index (BMI) of 28 to 28.9 in adult  History of breast cancer in female -     MR BREAST BILATERAL WO CONTRAST; Future     Return for 1 year physical. Patient was given opportunity to ask questions. Patient verbalized understanding of the plan and was able to repeat key elements of the plan. All questions were answered to their satisfaction.    I, Gwynneth Aliment, MD, have reviewed all documentation for this visit. The documentation on 10/11/22 for the exam, diagnosis, procedures, and orders are all accurate and complete.

## 2022-10-11 NOTE — Assessment & Plan Note (Addendum)
Chronic, well controlled. EKG performed, SB w/ voltage criteria for LVH,T-abnormality -anterolateral ischemia . She will c/w HCTZ 12.5mg  and metoprolol XL 25mg  daily. F/u 6 months. She is reminded to follow a low sodium diet.

## 2022-10-11 NOTE — Assessment & Plan Note (Signed)
A full exam was performed. She is s/p b/l mastectomy. PATIENT IS ADVISED TO GET 30-45 MINUTES REGULAR EXERCISE NO LESS THAN FOUR TO FIVE DAYS PER WEEK - BOTH WEIGHTBEARING EXERCISES AND AEROBIC ARE RECOMMENDED.  PATIENT IS ADVISED TO FOLLOW A HEALTHY DIET WITH AT LEAST SIX FRUITS/VEGGIES PER DAY, DECREASE INTAKE OF RED MEAT, AND TO INCREASE FISH INTAKE TO TWO DAYS PER WEEK.  MEATS/FISH SHOULD NOT BE FRIED, BAKED OR BROILED IS PREFERABLE.  IT IS ALSO IMPORTANT TO CUT BACK ON YOUR SUGAR INTAKE. PLEASE AVOID ANYTHING WITH ADDED SUGAR, CORN SYRUP OR OTHER SWEETENERS. IF YOU MUST USE A SWEETENER, YOU CAN TRY STEVIA. IT IS ALSO IMPORTANT TO AVOID ARTIFICIALLY SWEETENERS AND DIET BEVERAGES. LASTLY, I SUGGEST WEARING SPF 50 SUNSCREEN ON EXPOSED PARTS AND ESPECIALLY WHEN IN THE DIRECT SUNLIGHT FOR AN EXTENDED PERIOD OF TIME.  PLEASE AVOID FAST FOOD RESTAURANTS AND INCREASE YOUR WATER INTAKE.

## 2022-10-11 NOTE — Patient Instructions (Signed)

## 2022-10-12 DIAGNOSIS — Z853 Personal history of malignant neoplasm of breast: Secondary | ICD-10-CM | POA: Insufficient documentation

## 2022-10-12 DIAGNOSIS — E663 Overweight: Secondary | ICD-10-CM | POA: Insufficient documentation

## 2022-10-12 DIAGNOSIS — L989 Disorder of the skin and subcutaneous tissue, unspecified: Secondary | ICD-10-CM | POA: Insufficient documentation

## 2022-10-12 LAB — CMP14+EGFR
ALT: 26 IU/L (ref 0–32)
AST: 18 IU/L (ref 0–40)
Albumin: 4.6 g/dL (ref 3.8–4.9)
Alkaline Phosphatase: 98 IU/L (ref 44–121)
BUN/Creatinine Ratio: 17 (ref 9–23)
BUN: 15 mg/dL (ref 6–24)
Bilirubin Total: 0.7 mg/dL (ref 0.0–1.2)
CO2: 25 mmol/L (ref 20–29)
Calcium: 10.5 mg/dL — ABNORMAL HIGH (ref 8.7–10.2)
Chloride: 101 mmol/L (ref 96–106)
Creatinine, Ser: 0.86 mg/dL (ref 0.57–1.00)
Globulin, Total: 2.9 g/dL (ref 1.5–4.5)
Glucose: 91 mg/dL (ref 70–99)
Potassium: 4.5 mmol/L (ref 3.5–5.2)
Sodium: 140 mmol/L (ref 134–144)
Total Protein: 7.5 g/dL (ref 6.0–8.5)
eGFR: 79 mL/min/{1.73_m2} (ref >59)

## 2022-10-12 LAB — CBC
Hematocrit: 44.2 % (ref 34.0–46.6)
Hemoglobin: 14.4 g/dL (ref 11.1–15.9)
MCH: 29.6 pg (ref 26.6–33.0)
MCHC: 32.6 g/dL (ref 31.5–35.7)
MCV: 91 fL (ref 79–97)
Platelets: 277 10*3/uL (ref 150–450)
RBC: 4.86 x10E6/uL (ref 3.77–5.28)
RDW: 12.6 % (ref 11.7–15.4)
WBC: 6 10*3/uL (ref 3.4–10.8)

## 2022-10-12 LAB — LIPID PANEL
Chol/HDL Ratio: 2.5 ratio (ref 0.0–4.4)
Cholesterol, Total: 152 mg/dL (ref 100–199)
HDL: 61 mg/dL (ref >39)
LDL Chol Calc (NIH): 79 mg/dL (ref 0–99)
Triglycerides: 55 mg/dL (ref 0–149)
VLDL Cholesterol Cal: 12 mg/dL (ref 5–40)

## 2022-10-12 LAB — TSH: TSH: 1.12 u[IU]/mL (ref 0.450–4.500)

## 2022-10-12 LAB — MICROALBUMIN / CREATININE URINE RATIO
Creatinine, Urine: 91.9 mg/dL
Microalb/Creat Ratio: 5 mg/g creat (ref 0–29)
Microalbumin, Urine: 4.7 ug/mL

## 2022-10-12 NOTE — Assessment & Plan Note (Signed)
She is encouraged to aim for at least 150 minutes of exercise/week.

## 2022-10-12 NOTE — Assessment & Plan Note (Signed)
She is s/p b/l mastectomy and reconstructive surgery w/ bilateral silicone implants. I will order MRI b/l breasts. She is in agreement with treatment plan.

## 2022-10-12 NOTE — Assessment & Plan Note (Signed)
I will refer her to Dermatology for excision/biopsy.

## 2022-10-13 ENCOUNTER — Other Ambulatory Visit: Payer: Self-pay | Admitting: Internal Medicine

## 2022-10-25 ENCOUNTER — Encounter: Payer: Self-pay | Admitting: Internal Medicine

## 2022-10-27 DIAGNOSIS — G4733 Obstructive sleep apnea (adult) (pediatric): Secondary | ICD-10-CM | POA: Diagnosis not present

## 2022-11-20 ENCOUNTER — Ambulatory Visit: Admission: RE | Admit: 2022-11-20 | Payer: Federal, State, Local not specified - PPO | Source: Ambulatory Visit

## 2022-11-20 DIAGNOSIS — U071 COVID-19: Secondary | ICD-10-CM | POA: Diagnosis not present

## 2022-11-20 DIAGNOSIS — T8549XA Other mechanical complication of breast prosthesis and implant, initial encounter: Secondary | ICD-10-CM | POA: Diagnosis not present

## 2022-11-20 DIAGNOSIS — Z853 Personal history of malignant neoplasm of breast: Secondary | ICD-10-CM

## 2022-11-23 ENCOUNTER — Encounter: Payer: Self-pay | Admitting: Internal Medicine

## 2022-11-27 DIAGNOSIS — G4733 Obstructive sleep apnea (adult) (pediatric): Secondary | ICD-10-CM | POA: Diagnosis not present

## 2022-11-29 ENCOUNTER — Ambulatory Visit: Payer: Federal, State, Local not specified - PPO | Admitting: Neurology

## 2022-11-30 ENCOUNTER — Ambulatory Visit (INDEPENDENT_AMBULATORY_CARE_PROVIDER_SITE_OTHER): Payer: Federal, State, Local not specified - PPO | Admitting: Plastic Surgery

## 2022-11-30 VITALS — BP 146/87 | HR 70

## 2022-11-30 DIAGNOSIS — Z9010 Acquired absence of unspecified breast and nipple: Secondary | ICD-10-CM

## 2022-11-30 DIAGNOSIS — T8543XA Leakage of breast prosthesis and implant, initial encounter: Secondary | ICD-10-CM | POA: Diagnosis not present

## 2022-11-30 DIAGNOSIS — N651 Disproportion of reconstructed breast: Secondary | ICD-10-CM

## 2022-11-30 DIAGNOSIS — Z923 Personal history of irradiation: Secondary | ICD-10-CM

## 2022-11-30 DIAGNOSIS — Z853 Personal history of malignant neoplasm of breast: Secondary | ICD-10-CM

## 2022-11-30 NOTE — Progress Notes (Signed)
   Subjective:    Patient ID: Leslie Salazar, female    DOB: 08/04/1965, 57 y.o.   MRN: [id number]  The patient is a 57 year old female here for follow-up on her bilateral breast reconstruction.  In 2014 she was diagnosed with infiltrating ductal carcinoma of the right breast.  She underwent bilateral mastectomies after she tried lumpectomies.  She was radiated on the right breast.  The tumors were ER/PR positive and HER2 positive.  She was negative for gene genetic BRCA gene.  Due to the radiation she required a latissimus flap to the right breast and then had the implant exchanged in December 2014 for a Mentor smooth round high-profile 350 cc gel implant with some modifications in 2021 with fat grafting and release of capsular contracture.  As far as I can tell from the reports she has a silicone smooth round high-profile 400 cc implant in on the left.  She recently complained of some pain on the right breast so had an MRI which was positive for leakage of the left breast implant.  Because the implants are over 70 years old it is reasonable to do an exchange.      Review of Systems  Constitutional: Negative.   HENT: Negative.    Eyes: Negative.   Respiratory:  Positive for chest tightness.   Cardiovascular: Negative.   Gastrointestinal: Negative.   Endocrine: Negative.   Genitourinary: Negative.   Musculoskeletal: Negative.        Objective:   Physical Exam Vitals reviewed.  Constitutional:      Appearance: Normal appearance.  HENT:     Head: Atraumatic.  Cardiovascular:     Rate and Rhythm: Normal rate.     Pulses: Normal pulses.  Abdominal:     Palpations: Abdomen is soft.  Musculoskeletal:        General: Tenderness present. No swelling or deformity.  Skin:    General: Skin is warm.     Capillary Refill: Capillary refill takes less than 2 seconds.     Coloration: Skin is not jaundiced.     Findings: No bruising or lesion.  Neurological:     Mental Status: She is  alert and oriented to person, place, and time.  Psychiatric:        Mood and Affect: Mood normal.        Behavior: Behavior normal.        Thought Content: Thought content normal.        Judgment: Judgment normal.        Assessment & Plan:     ICD-10-CM   1. History of breast cancer in female  Z29.3     2. Status post radiation therapy  Z92.3     3. Breast asymmetry following reconstructive surgery  N65.1     4. Acquired absence of breast and absent nipple, unspecified laterality  Z90.10     5. Breast implant leak, initial encounter  T85.43XA       Plan for removal and replacement of bilateral implants.  Probably going slightly smaller on the left to equal the right side.  We may also have to do capsule release and excision of the right breast for better positioning.  I will plan on using an inframammary fold incision.  Pictures were obtained of the patient and placed in the chart with the patient's or guardian's permission.  Plan for implant removal and replacement with capsule work on the right breast.

## 2022-12-05 ENCOUNTER — Telehealth: Payer: Federal, State, Local not specified - PPO | Admitting: Family

## 2022-12-05 DIAGNOSIS — A6000 Herpesviral infection of urogenital system, unspecified: Secondary | ICD-10-CM

## 2022-12-05 MED ORDER — VALACYCLOVIR HCL 500 MG PO TABS
500.0000 mg | ORAL_TABLET | Freq: Two times a day (BID) | ORAL | 1 refills | Status: DC
Start: 2022-12-05 — End: 2022-12-22

## 2022-12-05 NOTE — Progress Notes (Signed)
E-Visit for Herpes Simplex  We are sorry that you are not feeling well.  Here is how we plan to help!  Based on what you have shared ith me, it looks like you may be having an outbreak/flare-up of genital herpes.    I have prescribed I have prescribed Valacyclovir 500 mg Take one by mouth twice a day for 3 days.    If you have been prescribed long term medications to be taken on a regular basis, it is important to follow the recommendations and take them as ordered.    Outbreaks usually include blisters and open sores in the genital area. Outbreaks that happen after the first time are usually not as severe and do not last as long. Genital Herpes Simplex is a commonly sexually transmitted viral infection that is found worldwide. Most of these genital infections are caused by one or two herpes simplex viruses that is passed from person to person during vaginal, oral, or anal sex. Sometimes, people do not know they have herpes because they do not have any symptoms.  Please be aware that if you have genital herpes you can be contagious even when you are not having rash or flare-up and you may not have any symptoms, even when you are taking suppressive medicines.  Herpes cannot be cured. The disease usually causes most problems during the first few years. After that, the virus is still there, but it causes few to no symptoms. Even when the virus is active, people with herpes can take medicines to reduce and help prevent symptoms.  Herpes is an infection that can cause blisters and open sores on the genital area. Herpes is caused by a virus that is passed from person to person during vaginal, oral, or anal sex. Sometimes, people do not know they have herpes because they do not have any symptoms. Herpes cannot be cured. The disease usually causes most problems during the first few years. After that, the virus is still there, but it causes few to no symptoms. Even when the virus is active, people with herpes  can take medicines to reduce and help prevent symptoms.  If you have been prescribed medications to be taken on a regular basis, it is important to follow the recommendations and take them as ordered.  Some people with herpes never have any symptoms. But other people can develop symptoms within a few weeks of being infected with the herpes virus   Symptoms usually include blisters in the genital area. In women, this area includes the vagina, buttocks, anus, or thighs. In men, this area includes the penis, scrotum, anus, butt, or thighs. The blisters can become painful open sores, which then crust over as they heal. Sometimes, people can have other symptoms that include:  ?Blisters on the mouth or lips ?Fever, headache, or pain in the joints ?Trouble urinating  Outbreaks might occur every month or more often, or just once or twice a year. Sometimes, people can tell when an outbreak will occur, because they feel itching or pain beforehand. Sometimes they do not know that an outbreak is coming because they have no symptoms. Whatever your pattern is, keep in mind that herpes outbreaks usually become less frequent over time as you get older. Certain things, called "triggers," can make outbreaks more likely to occur. These include stress, sunlight, menstrual periods,or getting sick.  Antiviral therapy can shorten the duration of symptoms and signs in primary infection, which, when untreated, can be associated with significant increase in the  symptoms of the disease.  HOME CARE Use a portable bath (such as a "Sitz bath") where you can sit in warm water for about 20 minutes. Your bathtub could also work. Avoid bubble baths.  Keep the genital area clean and dry and avoid tight clothes.  Take over-the-counter pain medicine such as acetaminophen (brand name: Tylenol) or ibuprofen sample brand names: Advil, Motrin). But avoid aspirin.  Only take medications as instructed by your medical team.  You are  most likely to spread herpes to a sex partner when you have blisters and open sores on your body. But it's also possible to spread herpes to your partner when you do not have any symptoms. That is because herpes can be present on your body without causing any symptoms, like blisters or pain.  Telling your sex partner that you have herpes can be hard. But it can help protect them, since there are ways to lower the risk of spreading the infection.   Using a condom every time you have sex  Not having sex when you have symptoms  Not having oral sex if you have blisters or open sores (in the genital area or around your mouth)  MAKE SURE YOU   Understand these instructions. Do not have sex without using a condom until you have been seen by a doctor and as instructed by the provider If you are not better or improved within 7 days, you MUST have a follow up at your doctor or the health department for evaluation. There are other causes of rashes in the genital region.  Thank you for choosing an e-visit.  Your e-visit answers were reviewed by a board certified advanced clinical practitioner to complete your personal care plan. Depending upon the condition, your plan could have included both over the counter or prescription medications.  Please review your pharmacy choice. Make sure the pharmacy is open so you can pick up prescription now. If there is a problem, you may contact your provider through MyChart messaging and have the prescription routed to another pharmacy.  Your safety is important to us. If you have drug allergies check your prescription carefully.   For the next 24 hours you can use MyChart to ask questions about today's visit, request a non-urgent call back, or ask for a work or school excuse. You will get an email in the next two days asking about your experience. I hope that your e-visit has been valuable and will speed your recovery.  Approximately 5 minutes was spent documenting  and reviewing patient's chart.     

## 2022-12-19 ENCOUNTER — Ambulatory Visit
Admission: RE | Admit: 2022-12-19 | Discharge: 2022-12-19 | Disposition: A | Discharge status: Home / Self Care | Payer: Federal, State, Local not specified - PPO | Source: Ambulatory Visit | Attending: Family Medicine | Admitting: Family Medicine

## 2022-12-19 ENCOUNTER — Other Ambulatory Visit: Payer: Self-pay

## 2022-12-19 DIAGNOSIS — L0231 Cutaneous abscess of buttock: Secondary | ICD-10-CM

## 2022-12-19 DIAGNOSIS — L03317 Cellulitis of buttock: Secondary | ICD-10-CM | POA: Diagnosis not present

## 2022-12-19 MED ORDER — DOXYCYCLINE HYCLATE 100 MG PO CAPS: 100.0000 mg | ORAL_CAPSULE | Freq: Two times a day (BID) | ORAL | 0 refills | Status: AC

## 2022-12-19 NOTE — ED Triage Notes (Signed)
Pt reports having an abscess on Rt buttocks for several days. Skin is red but there has not been any drainage.

## 2022-12-19 NOTE — ED Provider Notes (Signed)
[facility]    CSN: [id number] Arrival date & time: 12/19/22  1110      History   Chief Complaint Chief Complaint  Patient presents with   Abscess    Entered by patient    HPI Leslie Salazar is a 57 y.o. female.   HPI  Past Medical History:  Diagnosis Date   Acne    Cancer (HCC) 2012   RIGHT DUCTAL CARCINOMA INSITU.Marland Kitchen   Complication of anesthesia    Condyloma    ON BUTTOCKS   DCIS (ductal carcinoma in situ)    BRCA 1and 2 Neg./Pos ER, Pos PR   Fibroids    Heart palpitations    High cholesterol    History of angina    HSV-1 (herpes simplex virus 1) infection    Hx of radiation therapy 2013   from april to may - six weeks   Hypertension    Left ovarian cyst    PONV (postoperative nausea and vomiting)    nausea no vomiting   Scoliosis    Sleep apnea    c pap   Urinary urgency    Wears glasses     Patient Active Problem List   Diagnosis Date Noted   Breast implant leak, initial encounter 11/30/2022   Skin lesion of face 10/12/2022   Overweight with body mass index (BMI) of 28 to 28.9 in adult 10/12/2022   History of breast cancer in female 10/12/2022   Encounter for general adult medical examination w/o abnormal findings 10/11/2022   Decreased right shoulder range of motion 12/03/2021   Status post radiation therapy 12/03/2021   SOB (shortness of breath) on exertion 12/03/2021   Chronic ischemic heart disease 12/03/2021   Essential hypertension, benign 03/25/2021   OSA on CPAP 03/25/2021   Weight gain 03/25/2021   Sleep related headaches 01/28/2021   Intermittent palpitations 01/28/2021   Symptoms, such as flushing, sleeplessness, headache, lack of concentration, associated with the menopause 11/27/2020   Excessive daytime sleepiness 11/27/2020   Numbness and tingling in right hand 11/27/2020   Snoring 11/27/2020   Breast asymmetry following reconstructive surgery 05/01/2019   Ovarian cyst, left 10/20/2015   Premature ovarian failure  05/08/2014   Acquired absence of breast and absent nipple 11/16/2012   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   Menopausal and perimenopausal disorder 12/07/2010   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    Past Surgical History:  Procedure Laterality Date   BREAST CAPSULECTOMY WITH IMPLANT EXCHANGE Right 06/27/2019   Procedure: release of capsular contracture with repositioning of the right implant with possible exchange of right breast implant;  Surgeon: Peggye Form, DO;  Location: [facility];  Service: Plastics;  Laterality: Right;   BREAST RECONSTRUCTION WITH PLACEMENT OF TISSUE EXPANDER AND FLEX HD (ACELLULAR HYDRATED DERMIS) Right 11/16/2012   Procedure: RIGHT LATISSIMUS myocutaneous FLAP WITH PLACEMENT OF TISSUE EXPANDER  TO RIGHT BREAST;  Surgeon: Wayland Denis, DO;  Location: [facility] OR;  Service: Plastics;  Laterality: Right;   CARDIAC CATHETERIZATION N/A 02/28/2015   Procedure: Left Heart Cath and Coronary Angiography;  Surgeon: Yates Decamp, MD;  Location: [facility] INVASIVE CV LAB;  Service: Cardiovascular;  Laterality: N/A;   CESAREAN SECTION  2000   ENDOMETRIAL ABLATION W/ NOVASURE  2007   INSERTION OF TISSUE EXPANDER AFTER MASTECTOMY Bilateral 04/20/2011   Breast Ca (r) T1b N0 M0 stage IA infiltrating ductal carcinoma   LAPAROSCOPIC BILATERAL SALPINGO OOPHERECTOMY Bilateral 08/30/2018   Procedure: LAPAROSCOPIC BILATERAL SALPINGO OOPHORECTOMY w/ PERITONEAL WASHINGS;  Surgeon: Genia Del, MD;  Location: [facility];  Service: Gynecology;  Laterality: Bilateral;   LASER ABLATION OF THE CERVIX  1994   FOR DYSPLASIA   LIPOSUCTION WITH LIPOFILLING Right 03/14/2013   Procedure: LIPOSUCTION WITH LIPOFILLING;  Surgeon: Wayland Denis, DO;  Location: [facility];  Service: Plastics;  Laterality: Right;   LIPOSUCTION WITH LIPOFILLING Right 06/27/2019   Procedure: Fat grafting to right breast with excision of back excess skin;  Surgeon: Peggye Form, DO;   Location: [facility];  Service: Plastics;  Laterality: Right;  2 hours, please   RECONSTRUCTION BREAST W/ LATISSIMUS DORSI FLAP Right 11/16/2012   REMOVAL OF TISSUE EXPANDER AND PLACEMENT OF IMPLANT Right 03/14/2013   Procedure: REMOVAL OF RIGHT BREAST TISSUE EXPANDER WITH PLACEMENT OF RIGHT BREAST IMPLANT;  Surgeon: Wayland Denis, DO;  Location: [facility];  Service: Plastics;  Laterality: Right;  Removal of Right Breast Tissue Expander with Placement of Right Breast Implant, Possible Lipsuction with Lipofilling   TISSUE EXPANDER PLACEMENT Right 11/16/2012   TISSUE EXPANDER REMOVAL Bilateral 12/2011   TUBAL LIGATION  09/08/2001   BY LAPAROSCOPY ,, HULKA CLIPS TECHNIQUE   WISDOM TOOTH EXTRACTION      OB History     Gravida  1   Para  1   Term  1   Preterm      AB      Living  1      SAB      IAB      Ectopic      Multiple      Live Births  1            Home Medications    Prior to Admission medications   Medication Sig Start Date End Date Taking? Authorizing Provider  aspirin EC 81 MG tablet Take 81 mg by mouth every morning.   Yes [provider]  atorvastatin (LIPITOR) 20 MG tablet TAKE 1 TABLET(20 MG) BY MOUTH DAILY 03/30/22  Yes Dorothyann Peng, MD  Cholecalciferol 25 MCG (1000 UT) tablet Take 1 tablet by mouth daily.   Yes [provider]  Coenzyme Q10 100 MG capsule Take 1 capsule by mouth daily.   Yes [provider]  doxycycline (VIBRAMYCIN) 100 MG capsule Take 1 capsule (100 mg total) by mouth 2 (two) times daily for 10 days. 12/19/22 12/29/22 Yes Trevor Iha, FNP  hydrochlorothiazide (MICROZIDE) 12.5 MG capsule TAKE 1 CAPSULE(12.5 MG) BY MOUTH DAILY 10/15/22  Yes Dorothyann Peng, MD  hydrocortisone (ANUSOL-HC) 2.5 % rectal cream Place 1 application rectally 2 (two) times daily. 05/25/17  Yes Genia Del, MD  loratadine (CLARITIN) 10 MG tablet Take 10 mg by mouth every morning.    Yes [provider]  metoprolol succinate (TOPROL-XL) 25 MG 24 hr tablet TAKE 1 TABLET(25 MG) BY MOUTH DAILY 03/30/22  Yes Dorothyann Peng, MD  montelukast (SINGULAIR) 10 MG tablet TAKE 1 TABLET(10 MG) BY MOUTH DAILY 10/15/22  Yes Dorothyann Peng, MD  Multiple Vitamins-Minerals (MULTIVITAMIN WOMENS 50+ ADV PO) Take by mouth.   Yes [provider]  Omega-3 Fatty Acids (FISH OIL PO) Take by mouth. 1 capsule daily   Yes [provider]  ondansetron (ZOFRAN) 4 MG tablet Take 1 tablet (4 mg total) by mouth every 8 (eight) hours as needed for nausea or vomiting. 09/28/21  Yes Dorothyann Peng, MD  traZODone (DESYREL) 50 MG tablet TAKE 1 TABLET(50 MG) BY MOUTH AT BEDTIME AS NEEDED FOR SLEEP  12/08/21  Yes Dorothyann Peng, MD  valACYclovir (VALTREX) 500 MG tablet Take 1 tablet (500 mg total) by mouth 2 (two) times daily. 12/05/22  Yes Hawks, Edilia Bo, FNP    Family History Family History  Problem Relation Age of Onset   Diabetes Mother        HAS HAD A KIDNEY TRANSPLATN   Hypertension Mother    Kidney failure Mother    Hypertension Father    Gallstones Father    Hypertension Sister    Gallstones Sister    Hypertension Sister    Colon cancer Paternal Aunt    Lung cancer Maternal Grandfather     Social History Social History   Tobacco Use   Smoking status: Never   Smokeless tobacco: Never  Vaping Use   Vaping status: Never Used  Substance Use Topics   Alcohol use: Yes    Alcohol/week: 1.0 standard drink of alcohol    Types: 1 Glasses of wine per week   Drug use: No     Allergies   Patient has no known allergies.   Review of Systems Review of Systems  Skin:        Abscess of buttocks     Physical Exam Triage Vital Signs ED Triage Vitals  Encounter Vitals Group     BP      Systolic BP Percentile      Diastolic BP Percentile      Pulse      Resp      Temp      Temp src      SpO2      Weight      Height      Head Circumference      Peak Flow      Pain Score       Pain Loc      Pain Education      Exclude from Growth Chart    No data found.  Updated Vital Signs There were no vitals taken for this visit.   Physical Exam Vitals and nursing note reviewed. Exam conducted with a chaperone present.  Constitutional:      Appearance: Normal appearance.  HENT:     Head: Normocephalic and atraumatic.     Mouth/Throat:     Mouth: Mucous membranes are moist.     Pharynx: Oropharynx is clear.  Eyes:     Extraocular Movements: Extraocular movements intact.     Conjunctiva/sclera: Conjunctivae normal.     Pupils: Pupils are equal, round, and reactive to light.  Cardiovascular:     Rate and Rhythm: Normal rate and regular rhythm.     Pulses: Normal pulses.     Heart sounds: Normal heart sounds.  Pulmonary:     Effort: Pulmonary effort is normal.     Breath sounds: Normal breath sounds.  Musculoskeletal:        General: Normal range of motion.     Cervical back: Normal range of motion and neck supple.  Skin:    General: Skin is warm and dry.     Comments: Right buttocks:~2.5 cm x 2.5 cm circular shaped mildly erythematous/indurated abscess, no discharge, drainage, or bleeding noted-please see image below  Neurological:     General: No focal deficit present.     Mental Status: She is alert and oriented to person, place, and time.      UC Treatments / Results  Labs (all labs ordered are listed, but only abnormal results are displayed) Labs Reviewed -  No data to display  EKG   Radiology No results found.  Procedures Procedures (including critical care time)  Medications Ordered in UC Medications - No data to display  Initial Impression / Assessment and Plan / UC Course  I have reviewed the triage vital signs and the nursing notes.  Pertinent labs & imaging results that were available during my care of the patient were reviewed by me and considered in my medical decision making (see chart for details).     MDM: 1.  Abscess of  buttocks, right-Rx'd Doxycycline 100 mg capsule twice daily x 10 days; 2.  Cellulitis of buttocks, right Rx'd Doxycycline 100 mg capsule twice daily x 10 days. Instructed patient to take medication as directed with food to completion.  Encouraged increase daily water intake to 64 ounces per day while taking this medication.  Advised if symptoms worsen and/or unresolved please follow-up with PCP or here for further evaluation.  Patient discharged home, hemodynamically stable. Final Clinical Impressions(s) / UC Diagnoses   Final diagnoses:  Abscess of buttock, right  Cellulitis of buttock, right     Discharge Instructions      Instructed patient to take medication as directed with food to completion.  Encouraged increase daily water intake to 64 ounces per day while taking this medication.  Advised if symptoms worsen and/or unresolved please follow-up with PCP or here for further evaluation.     ED Prescriptions     Medication Sig Dispense Auth. Provider   doxycycline (VIBRAMYCIN) 100 MG capsule Take 1 capsule (100 mg total) by mouth 2 (two) times daily for 10 days. 20 capsule Trevor Iha, FNP      PDMP not reviewed this encounter.   Trevor Iha, FNP 12/19/22 1204

## 2022-12-19 NOTE — Discharge Instructions (Addendum)
Instructed patient to take medication as directed with food to completion.  Encouraged increase daily water intake to 64 ounces per day while taking this medication.  Advised if symptoms worsen and/or unresolved please follow-up with PCP or here for further evaluation. 

## 2022-12-20 ENCOUNTER — Telehealth: Payer: Self-pay

## 2022-12-20 NOTE — Transitions of Care (Post Inpatient/ED Visit) (Signed)
   12/20/2022  Name: ARHIANNA TORONTO MRN: [id number] DOB: Sep 18, 1965  Today's TOC FU Call Status: Today's TOC FU Call Status:: Successful TOC FU Call Completed TOC FU Call Complete Date: 12/19/22 Patient's Name and Date of Birth confirmed.  Transition Care Management Follow-up Telephone Call Date of Discharge: 12/19/22 Discharge Facility: Other [facility]) Name of Other (Non[facility]) Discharge Facility: [facility] Type of Discharge: Emergency Department Reason for ED Visit: Other: How have you been since you were released from the hospital?: Same Any questions or concerns?: No  Items Reviewed: Did you receive and understand the discharge instructions provided?: Yes Medications obtained,verified, and reconciled?: Yes (Medications Reviewed) Any new allergies since your discharge?: No Dietary orders reviewed?: NA Do you have support at home?: No  Medications Reviewed Today: Medications Reviewed Today   Medications were not reviewed in this encounter     Home Care and Equipment/Supplies: Were Home Health Services Ordered?: NA Any new equipment or medical supplies ordered?: NA  Functional Questionnaire: Do you need assistance with bathing/showering or dressing?: No Do you need assistance with meal preparation?: No Do you need assistance with eating?: No Do you have difficulty maintaining continence: No Do you need assistance with getting out of bed/getting out of a chair/moving?: No Do you have difficulty managing or taking your medications?: No  Follow up appointments reviewed: PCP Follow-up appointment confirmed?: Yes Date of PCP follow-up appointment?: 12/22/22 Follow-up Provider: Dorothyann Peng MD [facility] Follow-up appointment confirmed?: NA Do you need transportation to your follow-up appointment?: No Do you understand care options if your condition(s) worsen?: Yes-patient verbalized understanding    SIGNATUREYL,RMA

## 2022-12-22 ENCOUNTER — Encounter: Payer: Self-pay | Admitting: Internal Medicine

## 2022-12-22 ENCOUNTER — Ambulatory Visit (INDEPENDENT_AMBULATORY_CARE_PROVIDER_SITE_OTHER): Payer: Federal, State, Local not specified - PPO | Admitting: Internal Medicine

## 2022-12-22 VITALS — BP 128/80 | HR 66 | Temp 98.5°F | Ht 62.5 in | Wt 158.6 lb

## 2022-12-22 DIAGNOSIS — I10 Essential (primary) hypertension: Secondary | ICD-10-CM | POA: Diagnosis not present

## 2022-12-22 DIAGNOSIS — B009 Herpesviral infection, unspecified: Secondary | ICD-10-CM

## 2022-12-22 DIAGNOSIS — L0231 Cutaneous abscess of buttock: Secondary | ICD-10-CM

## 2022-12-22 DIAGNOSIS — Z23 Encounter for immunization: Secondary | ICD-10-CM | POA: Diagnosis not present

## 2022-12-22 MED ORDER — VALACYCLOVIR HCL 1 G PO TABS: 1000.0000 mg | ORAL_TABLET | Freq: Two times a day (BID) | ORAL | 0 refills | Status: AC

## 2022-12-22 NOTE — Patient Instructions (Signed)
Oscillococcinum- one twice daily x 5 days

## 2022-12-22 NOTE — Progress Notes (Signed)
I,Victoria T Deloria Lair, CMA,acting as a Neurosurgeon for Gwynneth Aliment, MD.,have documented all relevant documentation on the behalf of Gwynneth Aliment, MD,as directed by  Gwynneth Aliment, MD while in the presence of Gwynneth Aliment, MD.  Subjective:  Patient ID: Leslie Salazar , female    DOB: 02/24/1966 , 57 y.o.   MRN: [id number]  Chief Complaint  Patient presents with   Follow-up    HPI  Pt is here for an urgent care follow-up. She was diagnosed with cellulitis and prescribeed an antibiotic.  She feels that the rash has not improved. Area is itchy and uncomfortable. Pt admits to being on doxycycline since 12/19/2022.      Past Medical History:  Diagnosis Date   Acne    Cancer (HCC) 2012   RIGHT DUCTAL CARCINOMA INSITU.Marland Kitchen   Complication of anesthesia    Condyloma    ON BUTTOCKS   DCIS (ductal carcinoma in situ)    BRCA 1and 2 Neg./Pos ER, Pos PR   Fibroids    Heart palpitations    High cholesterol    History of angina    HSV-1 (herpes simplex virus 1) infection    Hx of radiation therapy 2013   from april to may - six weeks   Hypertension    Left ovarian cyst    PONV (postoperative nausea and vomiting)    nausea no vomiting   Scoliosis    Sleep apnea    c pap   Urinary urgency    Wears glasses      Family History  Problem Relation Age of Onset   Diabetes Mother        HAS HAD A KIDNEY TRANSPLATN   Hypertension Mother    Kidney failure Mother    Hypertension Father    Gallstones Father    Hypertension Sister    Gallstones Sister    Hypertension Sister    Colon cancer Paternal Aunt    Lung cancer Maternal Grandfather      Current Outpatient Medications:    aspirin EC 81 MG tablet, Take 81 mg by mouth every morning., Disp: , Rfl:    atorvastatin (LIPITOR) 20 MG tablet, TAKE 1 TABLET(20 MG) BY MOUTH DAILY, Disp: 90 tablet, Rfl: 2   Cholecalciferol 25 MCG (1000 UT) tablet, Take 1 tablet by mouth daily., Disp: , Rfl:    Coenzyme Q10 100 MG capsule, Take 1 capsule  by mouth daily., Disp: , Rfl:    hydrochlorothiazide (MICROZIDE) 12.5 MG capsule, TAKE 1 CAPSULE(12.5 MG) BY MOUTH DAILY, Disp: 90 capsule, Rfl: 1   hydrocortisone (ANUSOL-HC) 2.5 % rectal cream, Place 1 application rectally 2 (two) times daily., Disp: 30 g, Rfl: 3   loratadine (CLARITIN) 10 MG tablet, Take 10 mg by mouth every morning. , Disp: , Rfl:    metoprolol succinate (TOPROL-XL) 25 MG 24 hr tablet, TAKE 1 TABLET(25 MG) BY MOUTH DAILY, Disp: 90 tablet, Rfl: 2   montelukast (SINGULAIR) 10 MG tablet, TAKE 1 TABLET(10 MG) BY MOUTH DAILY, Disp: 90 tablet, Rfl: 2   Multiple Vitamins-Minerals (MULTIVITAMIN WOMENS 50+ ADV PO), Take by mouth., Disp: , Rfl:    Omega-3 Fatty Acids (FISH OIL PO), Take by mouth. 1 capsule daily, Disp: , Rfl:    ondansetron (ZOFRAN) 4 MG tablet, Take 1 tablet (4 mg total) by mouth every 8 (eight) hours as needed for nausea or vomiting., Disp: 20 tablet, Rfl: 0   traZODone (DESYREL) 50 MG tablet, TAKE 1 TABLET(50 MG) BY MOUTH  AT BEDTIME AS NEEDED FOR SLEEP, Disp: 90 tablet, Rfl: 1   valACYclovir (VALTREX) 1000 MG tablet, Take 1 tablet (1,000 mg total) by mouth 2 (two) times daily., Disp: 60 tablet, Rfl: 0   No Known Allergies   Review of Systems  Constitutional: Negative.   Respiratory: Negative.    Cardiovascular: Negative.   Gastrointestinal: Negative.   Skin:  Positive for rash.  Neurological: Negative.   Psychiatric/Behavioral: Negative.       Today's Vitals   12/22/22 1629  BP: 128/80  Pulse: 66  Temp: 98.5 F (36.9 C)  SpO2: 98%  Weight: 158 lb 9.6 oz (71.9 kg)  Height: 5' 2.5" (1.588 m)   Body mass index is 28.55 kg/m.  Wt Readings from Last 3 Encounters:  12/22/22 158 lb 9.6 oz (71.9 kg)  10/11/22 159 lb 3.2 oz (72.2 kg)  09/17/22 157 lb (71.2 kg)     Objective:  Physical Exam Vitals and nursing note reviewed.  Constitutional:      Appearance: Normal appearance.  HENT:     Head: Normocephalic and atraumatic.  Cardiovascular:      Rate and Rhythm: Normal rate and regular rhythm.     Heart sounds: Normal heart sounds.  Pulmonary:     Effort: Pulmonary effort is normal.     Breath sounds: Normal breath sounds.  Skin:    General: Skin is warm.     Comments: Vesicular rash on right glute. Vesicular lesions noted on erythematous base.   Neurological:     General: No focal deficit present.     Mental Status: She is alert.  Psychiatric:        Mood and Affect: Mood normal.        Behavior: Behavior normal.         Assessment And Plan:  Herpes infection Assessment & Plan: Her rash is due to herpetic outbreak, not cellulitis. I will send rx valacyclovir. She will take twice daily for up to a week, then once daily.    Essential hypertension, benign Assessment & Plan: Chronic, well controlled. No med changes today.    Immunization due Assessment & Plan: She was given flu vaccine prior to my assessment.   Orders: -     Flu vaccine trivalent PF, 6mos and older(Flulaval,Afluria,Fluarix,Fluzone)  Other orders -     valACYclovir HCl; Take 1 tablet (1,000 mg total) by mouth 2 (two) times daily.  Dispense: 60 tablet; Refill: 0     Return if symptoms worsen or fail to improve.  Patient was given opportunity to ask questions. Patient verbalized understanding of the plan and was able to repeat key elements of the plan. All questions were answered to their satisfaction.    I, Gwynneth Aliment, MD, have reviewed all documentation for this visit. The documentation on 01/02/23 for the exam, diagnosis, procedures, and orders are all accurate and complete.   IF YOU HAVE BEEN REFERRED TO A SPECIALIST, IT MAY TAKE 1-2 WEEKS TO SCHEDULE/PROCESS THE REFERRAL. IF YOU HAVE NOT HEARD FROM US/SPECIALIST IN TWO WEEKS, PLEASE GIVE [place] A CALL AT [phone] X [phone].   THE PATIENT IS ENCOURAGED TO PRACTICE SOCIAL DISTANCING DUE TO THE COVID-19 PANDEMIC.

## 2022-12-27 ENCOUNTER — Encounter: Payer: Federal, State, Local not specified - PPO | Admitting: Physician Assistant

## 2022-12-27 DIAGNOSIS — G4733 Obstructive sleep apnea (adult) (pediatric): Secondary | ICD-10-CM | POA: Diagnosis not present

## 2022-12-27 NOTE — Progress Notes (Deleted)
Patient ID: Leslie Salazar, female    DOB: 07-14-1965, 57 y.o.   MRN: [id number]  No chief complaint on file.   No diagnosis found.   History of Present Illness: Leslie Salazar is a 57 y.o.  female  with a history of right breast cancer with ipsilateral radiation and bilateral breast mastectomy and implant-based reconstruction.  She presents for preoperative evaluation for upcoming procedure, removal and replacement of bilateral breast implants and bilateral capsulectomy, scheduled for 01/27/2023 with Dr. Ulice Bold.  The patient {HAS HAS [id number]} had problems with anesthesia. ***  Summary of Previous Visit: Patient was seen for consult by Dr. Ulice Bold on 11/30/2022.  At that time, discussed her previous right-sided breast cancer with ipsilateral radiation and bilateral mastectomy and implant-based reconstruction.  She has a right sided latissimus flap due to the radiation and has 350 cc gel implant on that side.  400 cc Mentor smooth round high-profile gel implant on the other side.  Recent MRI with left-sided rupture.  Discussed implant removal and replacement.  Discussed possible right-sided capsule release and implant repositioning.  Additionally, commented that she would likely go small on the left to make it more proportionate with the right side.  Job: ***  PMH Significant for: Denies HTN, HLD, chronic ischemic heart disease on aspirin 81 mg, OSA on CPAP, right breast cancer s/p right-sided radiation and bilateral mastectomy/reconstruction with right sided latissimus dorsi flap.   Past Medical History: Allergies: No Known Allergies  Current Medications:  Current Outpatient Medications:    aspirin EC 81 MG tablet, Take 81 mg by mouth every morning., Disp: , Rfl:    atorvastatin (LIPITOR) 20 MG tablet, TAKE 1 TABLET(20 MG) BY MOUTH DAILY, Disp: 90 tablet, Rfl: 2   Cholecalciferol 25 MCG (1000 UT) tablet, Take 1 tablet by mouth daily., Disp: , Rfl:    Coenzyme Q10 100  MG capsule, Take 1 capsule by mouth daily., Disp: , Rfl:    doxycycline (VIBRAMYCIN) 100 MG capsule, Take 1 capsule (100 mg total) by mouth 2 (two) times daily for 10 days., Disp: 20 capsule, Rfl: 0   hydrochlorothiazide (MICROZIDE) 12.5 MG capsule, TAKE 1 CAPSULE(12.5 MG) BY MOUTH DAILY, Disp: 90 capsule, Rfl: 1   hydrocortisone (ANUSOL-HC) 2.5 % rectal cream, Place 1 application rectally 2 (two) times daily., Disp: 30 g, Rfl: 3   loratadine (CLARITIN) 10 MG tablet, Take 10 mg by mouth every morning. , Disp: , Rfl:    metoprolol succinate (TOPROL-XL) 25 MG 24 hr tablet, TAKE 1 TABLET(25 MG) BY MOUTH DAILY, Disp: 90 tablet, Rfl: 2   montelukast (SINGULAIR) 10 MG tablet, TAKE 1 TABLET(10 MG) BY MOUTH DAILY, Disp: 90 tablet, Rfl: 2   Multiple Vitamins-Minerals (MULTIVITAMIN WOMENS 50+ ADV PO), Take by mouth., Disp: , Rfl:    Omega-3 Fatty Acids (FISH OIL PO), Take by mouth. 1 capsule daily, Disp: , Rfl:    ondansetron (ZOFRAN) 4 MG tablet, Take 1 tablet (4 mg total) by mouth every 8 (eight) hours as needed for nausea or vomiting., Disp: 20 tablet, Rfl: 0   traZODone (DESYREL) 50 MG tablet, TAKE 1 TABLET(50 MG) BY MOUTH AT BEDTIME AS NEEDED FOR SLEEP, Disp: 90 tablet, Rfl: 1   valACYclovir (VALTREX) 1000 MG tablet, Take 1 tablet (1,000 mg total) by mouth 2 (two) times daily., Disp: 60 tablet, Rfl: 0  Past Medical Problems: Past Medical History:  Diagnosis Date   Acne    Cancer (HCC) 2012   RIGHT DUCTAL CARCINOMA INSITU.Marland Kitchen  Complication of anesthesia    Condyloma    ON BUTTOCKS   DCIS (ductal carcinoma in situ)    BRCA 1and 2 Neg./Pos ER, Pos PR   Fibroids    Heart palpitations    High cholesterol    History of angina    HSV-1 (herpes simplex virus 1) infection    Hx of radiation therapy 2013   from april to may - six weeks   Hypertension    Left ovarian cyst    PONV (postoperative nausea and vomiting)    nausea no vomiting   Scoliosis    Sleep apnea    c pap   Urinary urgency     Wears glasses     Past Surgical History: Past Surgical History:  Procedure Laterality Date   BREAST CAPSULECTOMY WITH IMPLANT EXCHANGE Right 06/27/2019   Procedure: release of capsular contracture with repositioning of the right implant with possible exchange of right breast implant;  Surgeon: Peggye Form, DO;  Location: [facility];  Service: Plastics;  Laterality: Right;   BREAST RECONSTRUCTION WITH PLACEMENT OF TISSUE EXPANDER AND FLEX HD (ACELLULAR HYDRATED DERMIS) Right 11/16/2012   Procedure: RIGHT LATISSIMUS myocutaneous FLAP WITH PLACEMENT OF TISSUE EXPANDER  TO RIGHT BREAST;  Surgeon: Wayland Denis, DO;  Location: [facility];  Service: Plastics;  Laterality: Right;   CARDIAC CATHETERIZATION N/A 02/28/2015   Procedure: Left Heart Cath and Coronary Angiography;  Surgeon: Yates Decamp, MD;  Location: [facility] INVASIVE CV LAB;  Service: Cardiovascular;  Laterality: N/A;   CESAREAN SECTION  2000   ENDOMETRIAL ABLATION W/ NOVASURE  2007   INSERTION OF TISSUE EXPANDER AFTER MASTECTOMY Bilateral 04/20/2011   Breast Ca (r) T1b N0 M0 stage IA infiltrating ductal carcinoma   LAPAROSCOPIC BILATERAL SALPINGO OOPHERECTOMY Bilateral 08/30/2018   Procedure: LAPAROSCOPIC BILATERAL SALPINGO OOPHORECTOMY w/ PERITONEAL WASHINGS;  Surgeon: Genia Del, MD;  Location: [facility];  Service: Gynecology;  Laterality: Bilateral;   LASER ABLATION OF THE CERVIX  1994   FOR DYSPLASIA   LIPOSUCTION WITH LIPOFILLING Right 03/14/2013   Procedure: LIPOSUCTION WITH LIPOFILLING;  Surgeon: Wayland Denis, DO;  Location: [facility];  Service: Plastics;  Laterality: Right;   LIPOSUCTION WITH LIPOFILLING Right 06/27/2019   Procedure: Fat grafting to right breast with excision of back excess skin;  Surgeon: Peggye Form, DO;  Location: [facility];  Service: Plastics;  Laterality: Right;  2 hours, please   RECONSTRUCTION BREAST W/ LATISSIMUS DORSI FLAP Right  11/16/2012   REMOVAL OF TISSUE EXPANDER AND PLACEMENT OF IMPLANT Right 03/14/2013   Procedure: REMOVAL OF RIGHT BREAST TISSUE EXPANDER WITH PLACEMENT OF RIGHT BREAST IMPLANT;  Surgeon: Wayland Denis, DO;  Location: [facility];  Service: Plastics;  Laterality: Right;  Removal of Right Breast Tissue Expander with Placement of Right Breast Implant, Possible Lipsuction with Lipofilling   TISSUE EXPANDER PLACEMENT Right 11/16/2012   TISSUE EXPANDER REMOVAL Bilateral 12/2011   TUBAL LIGATION  09/08/2001   BY LAPAROSCOPY ,, HULKA CLIPS TECHNIQUE   WISDOM TOOTH EXTRACTION      Social History: Social History   Socioeconomic History   Marital status: Legally Separated    Spouse name: Not on file   Number of children: 1   Years of education: Not on file   Highest education level: Master's degree (e.g., MA, MS, MEng, MEd, MSW, MBA)  Occupational History   Not on file  Tobacco Use   Smoking status: Never   Smokeless tobacco: Never  Vaping Use   Vaping status: Never Used  Substance and Sexual Activity   Alcohol use: Yes    Alcohol/week: 1.0 standard drink of alcohol    Types: 1 Glasses of wine per week   Drug use: No   Sexual activity: Not Currently    Birth control/protection: Surgical    Comment: 1ST INTERCOURSE- 29, PARTNERS- 5, btl & oophorectomy  Other Topics Concern   Not on file  Social History Narrative   Not on file   Social Determinants of Health   Financial Resource Strain: Low Risk  (12/21/2022)   Overall Financial Resource Strain (CARDIA)    Difficulty of Paying Living Expenses: Not hard at all  Food Insecurity: No Food Insecurity (12/21/2022)   Hunger Vital Sign    Worried About Running Out of Food in the Last Year: Never true    Ran Out of Food in the Last Year: Never true  Transportation Needs: No Transportation Needs (12/21/2022)   PRAPARE - Administrator, Civil Service (Medical): No    Lack of Transportation (Non-Medical): No  Physical  Activity: Insufficiently Active (12/21/2022)   Exercise Vital Sign    Days of Exercise per Week: 2 days    Minutes of Exercise per Session: 30 min  Stress: No Stress Concern Present (12/21/2022)   [employer] of Occupational Health - Occupational Stress Questionnaire    Feeling of Stress : Only a little  Social Connections: Moderately Isolated (12/21/2022)   Social Connection and Isolation Panel [NHANES]    Frequency of Communication with Friends and Family: Three times a week    Frequency of Social Gatherings with Friends and Family: Once a week    Attends Religious Services: 1 to 4 times per year    Active Member of [employer] or Organizations: No    Attends Engineer, structural: Not on file    Marital Status: Separated  Intimate Partner Violence: Unknown (07/14/2021)   Received from [employer], [facility]   HITS    Physically Hurt: Not on file    Insult or Talk Down To: Not on file    Threaten Physical Harm: Not on file    Scream or Curse: Not on file    Family History: Family History  Problem Relation Age of Onset   Diabetes Mother        HAS HAD A KIDNEY TRANSPLATN   Hypertension Mother    Kidney failure Mother    Hypertension Father    Gallstones Father    Hypertension Sister    Gallstones Sister    Hypertension Sister    Colon cancer Paternal Aunt    Lung cancer Maternal Grandfather     Review of Systems: ROS  Physical Exam: Vital Signs There were no vitals taken for this visit.  Physical Exam *** Constitutional:      General: Not in acute distress.    Appearance: Normal appearance. Not ill-appearing.  HENT:     Head: Normocephalic and atraumatic.  Eyes:     Pupils: Pupils are equal, round. Cardiovascular:     Rate and Rhythm: Normal rate.    Pulses: Normal pulses.  Pulmonary:     Effort: No respiratory distress or increased work of breathing.  Speaks in full sentences. Abdominal:     General: Abdomen is flat. No distension.    Musculoskeletal: Normal range of motion. No lower extremity swelling or edema. No varicosities. *** Skin:    General: Skin is warm and dry.  Findings: No erythema or rash.  Neurological:     Mental Status: Alert and oriented to person, place, and time.  Psychiatric:        Mood and Affect: Mood normal.        Behavior: Behavior normal.    Assessment/Plan: The patient is scheduled for *** with Dr. Jenean Lindau.  Risks, benefits, and alternatives of procedure discussed, questions answered and consent obtained.    Smoking Status: ***; Counseling Given? *** Last Mammogram: ***; Results: ***  Caprini Score: ***; Risk Factors include: ***, BMI *** 25, and length of planned surgery. Recommendation for mechanical *** pharmacological prophylaxis. Encourage early ambulation.   Pictures obtained: ***  Post-op Rx sent to pharmacy: ***  Patient was provided with the *** General Surgical Risk consent document and Pain Medication Agreement prior to their appointment.  They had adequate time to read through the risk consent documents and Pain Medication Agreement. We also discussed them in person together during this preop appointment. All of their questions were answered to their satisfaction.  Recommended calling if they have any further questions.  Risk consent form and Pain Medication Agreement to be scanned into patient's chart.  ***   Electronically signed by: Evelena Leyden, PA-C 12/27/2022 8:38 AM

## 2022-12-28 ENCOUNTER — Encounter: Payer: Self-pay | Admitting: Internal Medicine

## 2023-01-01 DIAGNOSIS — G4733 Obstructive sleep apnea (adult) (pediatric): Secondary | ICD-10-CM | POA: Diagnosis not present

## 2023-01-02 DIAGNOSIS — B009 Herpesviral infection, unspecified: Secondary | ICD-10-CM | POA: Insufficient documentation

## 2023-01-02 DIAGNOSIS — Z23 Encounter for immunization: Secondary | ICD-10-CM | POA: Insufficient documentation

## 2023-01-02 NOTE — Assessment & Plan Note (Signed)
Her rash is due to herpetic outbreak, not cellulitis. I will send rx valacyclovir. She will take twice daily for up to a week, then once daily.

## 2023-01-02 NOTE — Assessment & Plan Note (Signed)
She was given flu vaccine prior to my assessment.

## 2023-01-02 NOTE — Assessment & Plan Note (Signed)
Chronic, well controlled. No med changes today.

## 2023-01-04 NOTE — Progress Notes (Unsigned)
Patient ID: Leslie Salazar, female    DOB: [birth year], 57 y.o.   MRN: [id number]  No chief complaint on file.   No diagnosis found.   History of Present Illness: Leslie Salazar is a 57 y.o.  female  with a history of right breast cancer with ipsilateral radiation and bilateral breast mastectomy and implant-based reconstruction.  She presents for preoperative evaluation for upcoming procedure, removal and replacement of bilateral breast implants and bilateral capsulectomy, scheduled for 01/27/2023 with Dr. Ulice Bold.  The patient {HAS HAS [id number]} had problems with anesthesia. ***  Summary of Previous Visit: Patient was seen for consult by Dr. Ulice Bold on 11/30/2022.  At that time, discussed her previous right-sided breast cancer with ipsilateral radiation and bilateral mastectomy and implant-based reconstruction.  She has a right sided latissimus flap due to the radiation and has 350 cc gel implant on that side.  400 cc Mentor smooth round high-profile gel implant on the other side.  Recent MRI with left-sided rupture.  Discussed implant removal and replacement.  Discussed possible right-sided capsule release and implant repositioning.  Additionally, commented that she would likely go small on the left to make it more proportionate with the right side.  Job: ***  PMH Significant for: Denies HTN, HLD, chronic ischemic heart disease on aspirin 81 mg, OSA on CPAP, right breast cancer s/p right-sided radiation and bilateral mastectomy/reconstruction with right sided latissimus dorsi flap.   Past Medical History: Allergies: No Known Allergies  Current Medications:  Current Outpatient Medications:    aspirin EC 81 MG tablet, Take 81 mg by mouth every morning., Disp: , Rfl:    atorvastatin (LIPITOR) 20 MG tablet, TAKE 1 TABLET(20 MG) BY MOUTH DAILY, Disp: 90 tablet, Rfl: 2   Cholecalciferol 25 MCG (1000 UT) tablet, Take 1 tablet by mouth daily., Disp: , Rfl:    Coenzyme Q10 100  MG capsule, Take 1 capsule by mouth daily., Disp: , Rfl:    hydrochlorothiazide (MICROZIDE) 12.5 MG capsule, TAKE 1 CAPSULE(12.5 MG) BY MOUTH DAILY, Disp: 90 capsule, Rfl: 1   hydrocortisone (ANUSOL-HC) 2.5 % rectal cream, Place 1 application rectally 2 (two) times daily., Disp: 30 g, Rfl: 3   loratadine (CLARITIN) 10 MG tablet, Take 10 mg by mouth every morning. , Disp: , Rfl:    metoprolol succinate (TOPROL-XL) 25 MG 24 hr tablet, TAKE 1 TABLET(25 MG) BY MOUTH DAILY, Disp: 90 tablet, Rfl: 2   montelukast (SINGULAIR) 10 MG tablet, TAKE 1 TABLET(10 MG) BY MOUTH DAILY, Disp: 90 tablet, Rfl: 2   Multiple Vitamins-Minerals (MULTIVITAMIN WOMENS 50+ ADV PO), Take by mouth., Disp: , Rfl:    Omega-3 Fatty Acids (FISH OIL PO), Take by mouth. 1 capsule daily, Disp: , Rfl:    ondansetron (ZOFRAN) 4 MG tablet, Take 1 tablet (4 mg total) by mouth every 8 (eight) hours as needed for nausea or vomiting., Disp: 20 tablet, Rfl: 0   traZODone (DESYREL) 50 MG tablet, TAKE 1 TABLET(50 MG) BY MOUTH AT BEDTIME AS NEEDED FOR SLEEP, Disp: 90 tablet, Rfl: 1   valACYclovir (VALTREX) 1000 MG tablet, Take 1 tablet (1,000 mg total) by mouth 2 (two) times daily., Disp: 60 tablet, Rfl: 0  Past Medical Problems: Past Medical History:  Diagnosis Date   Acne    Cancer (HCC) 2012   RIGHT DUCTAL CARCINOMA INSITU.Marland Kitchen   Complication of anesthesia    Condyloma    ON BUTTOCKS   DCIS (ductal carcinoma in situ)    BRCA 1and 2  Neg./Pos ER, Pos PR   Fibroids    Heart palpitations    High cholesterol    History of angina    HSV-1 (herpes simplex virus 1) infection    Hx of radiation therapy 2013   from april to may - six weeks   Hypertension    Left ovarian cyst    PONV (postoperative nausea and vomiting)    nausea no vomiting   Scoliosis    Sleep apnea    c pap   Urinary urgency    Wears glasses     Past Surgical History: Past Surgical History:  Procedure Laterality Date   BREAST CAPSULECTOMY WITH IMPLANT EXCHANGE  Right 06/27/2019   Procedure: release of capsular contracture with repositioning of the right implant with possible exchange of right breast implant;  Surgeon: Peggye Form, DO;  Location: [facility];  Service: Plastics;  Laterality: Right;   BREAST RECONSTRUCTION WITH PLACEMENT OF TISSUE EXPANDER AND FLEX HD (ACELLULAR HYDRATED DERMIS) Right 11/16/2012   Procedure: RIGHT LATISSIMUS myocutaneous FLAP WITH PLACEMENT OF TISSUE EXPANDER  TO RIGHT BREAST;  Surgeon: Wayland Denis, DO;  Location: [facility];  Service: Plastics;  Laterality: Right;   CARDIAC CATHETERIZATION N/A 02/28/2015   Procedure: Left Heart Cath and Coronary Angiography;  Surgeon: Yates Decamp, MD;  Location: [facility] INVASIVE CV LAB;  Service: Cardiovascular;  Laterality: N/A;   CESAREAN SECTION  2000   ENDOMETRIAL ABLATION W/ NOVASURE  2007   INSERTION OF TISSUE EXPANDER AFTER MASTECTOMY Bilateral 04/20/2011   Breast Ca (r) T1b N0 M0 stage IA infiltrating ductal carcinoma   LAPAROSCOPIC BILATERAL SALPINGO OOPHERECTOMY Bilateral 08/30/2018   Procedure: LAPAROSCOPIC BILATERAL SALPINGO OOPHORECTOMY w/ PERITONEAL WASHINGS;  Surgeon: Genia Del, MD;  Location: [facility];  Service: Gynecology;  Laterality: Bilateral;   LASER ABLATION OF THE CERVIX  1994   FOR DYSPLASIA   LIPOSUCTION WITH LIPOFILLING Right 03/14/2013   Procedure: LIPOSUCTION WITH LIPOFILLING;  Surgeon: Wayland Denis, DO;  Location: [facility];  Service: Plastics;  Laterality: Right;   LIPOSUCTION WITH LIPOFILLING Right 06/27/2019   Procedure: Fat grafting to right breast with excision of back excess skin;  Surgeon: Peggye Form, DO;  Location: [facility];  Service: Plastics;  Laterality: Right;  2 hours, please   RECONSTRUCTION BREAST W/ LATISSIMUS DORSI FLAP Right 11/16/2012   REMOVAL OF TISSUE EXPANDER AND PLACEMENT OF IMPLANT Right 03/14/2013   Procedure: REMOVAL OF RIGHT BREAST TISSUE EXPANDER WITH  PLACEMENT OF RIGHT BREAST IMPLANT;  Surgeon: Wayland Denis, DO;  Location: [facility];  Service: Plastics;  Laterality: Right;  Removal of Right Breast Tissue Expander with Placement of Right Breast Implant, Possible Lipsuction with Lipofilling   TISSUE EXPANDER PLACEMENT Right 11/16/2012   TISSUE EXPANDER REMOVAL Bilateral 12/2011   TUBAL LIGATION  09/08/2001   BY LAPAROSCOPY ,, HULKA CLIPS TECHNIQUE   WISDOM TOOTH EXTRACTION      Social History: Social History   Socioeconomic History   Marital status: Legally Separated    Spouse name: Not on file   Number of children: 1   Years of education: Not on file   Highest education level: Master's degree (e.g., MA, MS, MEng, MEd, MSW, MBA)  Occupational History   Not on file  Tobacco Use   Smoking status: Never   Smokeless tobacco: Never  Vaping Use   Vaping status: Never Used  Substance and Sexual Activity   Alcohol use: Yes    Alcohol/week: 1.0 standard  drink of alcohol    Types: 1 Glasses of wine per week   Drug use: No   Sexual activity: Not Currently    Birth control/protection: Surgical    Comment: 1ST INTERCOURSE- 19, PARTNERS- 5, btl & oophorectomy  Other Topics Concern   Not on file  Social History Narrative   Not on file   Social Determinants of Health   Financial Resource Strain: Low Risk  (12/21/2022)   Overall Financial Resource Strain (CARDIA)    Difficulty of Paying Living Expenses: Not hard at all  Food Insecurity: No Food Insecurity (12/21/2022)   Hunger Vital Sign    Worried About Running Out of Food in the Last Year: Never true    Ran Out of Food in the Last Year: Never true  Transportation Needs: No Transportation Needs (12/21/2022)   PRAPARE - Administrator, Civil Service (Medical): No    Lack of Transportation (Non-Medical): No  Physical Activity: Insufficiently Active (12/21/2022)   Exercise Vital Sign    Days of Exercise per Week: 2 days    Minutes of Exercise per Session: 30  min  Stress: No Stress Concern Present (12/21/2022)   [employer] of Occupational Health - Occupational Stress Questionnaire    Feeling of Stress : Only a little  Social Connections: Moderately Isolated (12/21/2022)   Social Connection and Isolation Panel [NHANES]    Frequency of Communication with Friends and Family: Three times a week    Frequency of Social Gatherings with Friends and Family: Once a week    Attends Religious Services: 1 to 4 times per year    Active Member of [employer] or Organizations: No    Attends Engineer, structural: Not on file    Marital Status: Separated  Intimate Partner Violence: Unknown (07/14/2021)   Received from [employer], [facility]   HITS    Physically Hurt: Not on file    Insult or Talk Down To: Not on file    Threaten Physical Harm: Not on file    Scream or Curse: Not on file    Family History: Family History  Problem Relation Age of Onset   Diabetes Mother        HAS HAD A KIDNEY TRANSPLATN   Hypertension Mother    Kidney failure Mother    Hypertension Father    Gallstones Father    Hypertension Sister    Gallstones Sister    Hypertension Sister    Colon cancer Paternal Aunt    Lung cancer Maternal Grandfather     Review of Systems: ROS  Physical Exam: Vital Signs There were no vitals taken for this visit.  Physical Exam *** Constitutional:      General: Not in acute distress.    Appearance: Normal appearance. Not ill-appearing.  HENT:     Head: Normocephalic and atraumatic.  Eyes:     Pupils: Pupils are equal, round. Cardiovascular:     Rate and Rhythm: Normal rate.    Pulses: Normal pulses.  Pulmonary:     Effort: No respiratory distress or increased work of breathing.  Speaks in full sentences. Abdominal:     General: Abdomen is flat. No distension.   Musculoskeletal: Normal range of motion. No lower extremity swelling or edema. No varicosities. *** Skin:    General: Skin is warm and dry.      Findings: No erythema or rash.  Neurological:     Mental Status: Alert and oriented to person, place, and time.  Psychiatric:        Mood and Affect: Mood normal.        Behavior: Behavior normal.    Assessment/Plan: The patient is scheduled for *** with Dr. Jenean Lindau.  Risks, benefits, and alternatives of procedure discussed, questions answered and consent obtained.    Smoking Status: ***; Counseling Given? *** Last Mammogram: ***; Results: ***  Caprini Score: ***; Risk Factors include: ***, BMI *** 25, and length of planned surgery. Recommendation for mechanical *** pharmacological prophylaxis. Encourage early ambulation.   Pictures obtained: ***  Post-op Rx sent to pharmacy: ***  Patient was provided with the *** General Surgical Risk consent document and Pain Medication Agreement prior to their appointment.  They had adequate time to read through the risk consent documents and Pain Medication Agreement. We also discussed them in person together during this preop appointment. All of their questions were answered to their satisfaction.  Recommended calling if they have any further questions.  Risk consent form and Pain Medication Agreement to be scanned into patient's chart.  ***   Electronically signed by: Evelena Leyden, PA-C 01/04/2023 9:53 AM

## 2023-01-04 NOTE — H&P (View-Only) (Signed)
Patient ID: Leslie Salazar, female    DOB: [birth year], 57 y.o.   MRN: [id number]  Chief Complaint  Patient presents with   Pre-op Exam      ICD-10-CM   1. Breast asymmetry following reconstructive surgery  N65.1        History of Present Illness: Leslie Salazar is a 57 y.o.  female  with a history of right breast cancer with ipsilateral radiation and bilateral breast mastectomy and implant-based reconstruction.  She presents for preoperative evaluation for upcoming procedure, removal and replacement of bilateral breast implants and bilateral capsulectomy and right sided repositioning, scheduled for 01/27/2023 with Dr. Ulice Bold.  The patient has not had problems with anesthesia.  No issues after previous surgeries.  She reports that she was started on ASA 81 mg by cardiologist approximately 3 to 4 years ago, but is now being managed by her primary care provider.  Will obtain surgical clearance and permission to hold 7 days prior to surgery and restart 2 days after surgery.  She confirms that she would like her right implant to be adjusted more medially on the chest as it has been asymmetrically lateral.  She also confirms the left is slightly larger than the right and would appreciate improved symmetry.  She states that she has bilateral breast tattoos and we discussed possibility for disruption due to incision and closure.  She reports that she will be able to get them touched up afterwards if it is an issue.  She denies any personal or family history of blood clots or clotting disorder.  No history of varicosities.  Denies any nicotine use or history of pulmonary disease.  Summary of Previous Visit: Patient was seen for consult by Dr. Ulice Bold on 11/30/2022.  At that time, discussed her previous right-sided breast cancer with ipsilateral radiation and bilateral mastectomy and implant-based reconstruction.  She has a right sided latissimus flap due to the radiation and has 350 cc gel  implant on that side.  400 cc Mentor smooth round high-profile gel implant on the other side.  Recent MRI with left-sided rupture.  Discussed implant removal and replacement.  Discussed possible right-sided capsule release and implant repositioning.  Additionally, commented that she would likely go small on the left to make it more proportionate with the right side.  Job: Production assistant, radio, discussed 1 week off of work completely and then work from home the following week.  Will also provide a letter to her exercise trainer regarding postoperative restrictions.  PMH Significant for: HTN, HLD, chronic ischemic heart disease on aspirin 81 mg, OSA on CPAP, right breast cancer s/p right-sided radiation and bilateral mastectomy/reconstruction with right sided latissimus dorsi flap.   Past Medical History: Allergies: No Known Allergies  Current Medications:  Current Outpatient Medications:    aspirin EC 81 MG tablet, Take 81 mg by mouth every morning., Disp: , Rfl:    atorvastatin (LIPITOR) 20 MG tablet, TAKE 1 TABLET(20 MG) BY MOUTH DAILY, Disp: 90 tablet, Rfl: 2   cephALEXin (KEFLEX) 500 MG capsule, Take 1 capsule (500 mg total) by mouth 4 (four) times daily for 3 days., Disp: 12 capsule, Rfl: 0   Cholecalciferol 25 MCG (1000 UT) tablet, Take 1 tablet by mouth daily., Disp: , Rfl:    Coenzyme Q10 100 MG capsule, Take 1 capsule by mouth daily., Disp: , Rfl:    hydrochlorothiazide (MICROZIDE) 12.5 MG capsule, TAKE 1 CAPSULE(12.5 MG) BY MOUTH DAILY, Disp: 90 capsule, Rfl: 1   hydrocortisone (  ANUSOL-HC) 2.5 % rectal cream, Place 1 application rectally 2 (two) times daily., Disp: 30 g, Rfl: 3   loratadine (CLARITIN) 10 MG tablet, Take 10 mg by mouth every morning. , Disp: , Rfl:    metoprolol succinate (TOPROL-XL) 25 MG 24 hr tablet, TAKE 1 TABLET(25 MG) BY MOUTH DAILY, Disp: 90 tablet, Rfl: 2   montelukast (SINGULAIR) 10 MG tablet, TAKE 1 TABLET(10 MG) BY MOUTH DAILY, Disp: 90 tablet, Rfl: 2   Multiple  Vitamins-Minerals (MULTIVITAMIN WOMENS 50+ ADV PO), Take by mouth., Disp: , Rfl:    Omega-3 Fatty Acids (FISH OIL PO), Take by mouth. 1 capsule daily, Disp: , Rfl:    ondansetron (ZOFRAN) 4 MG tablet, Take 1 tablet (4 mg total) by mouth every 8 (eight) hours as needed for nausea or vomiting., Disp: 20 tablet, Rfl: 0   ondansetron (ZOFRAN-ODT) 4 MG disintegrating tablet, Take 1 tablet (4 mg total) by mouth every 8 (eight) hours as needed for nausea or vomiting., Disp: 20 tablet, Rfl: 0   oxyCODONE (ROXICODONE) 5 MG immediate release tablet, Take 1 tablet (5 mg total) by mouth every 6 (six) hours as needed for up to 5 days for severe pain., Disp: 20 tablet, Rfl: 0   traZODone (DESYREL) 50 MG tablet, TAKE 1 TABLET(50 MG) BY MOUTH AT BEDTIME AS NEEDED FOR SLEEP, Disp: 90 tablet, Rfl: 1   valACYclovir (VALTREX) 1000 MG tablet, Take 1 tablet (1,000 mg total) by mouth 2 (two) times daily., Disp: 60 tablet, Rfl: 0  Past Medical Problems: Past Medical History:  Diagnosis Date   Acne    Cancer (HCC) 2012   RIGHT DUCTAL CARCINOMA INSITU.Marland Kitchen   Complication of anesthesia    Condyloma    ON BUTTOCKS   DCIS (ductal carcinoma in situ)    BRCA 1and 2 Neg./Pos ER, Pos PR   Fibroids    Heart palpitations    High cholesterol    History of angina    HSV-1 (herpes simplex virus 1) infection    Hx of radiation therapy 2013   from april to may - six weeks   Hypertension    Left ovarian cyst    PONV (postoperative nausea and vomiting)    nausea no vomiting   Scoliosis    Sleep apnea    c pap   Urinary urgency    Wears glasses     Past Surgical History: Past Surgical History:  Procedure Laterality Date   BREAST CAPSULECTOMY WITH IMPLANT EXCHANGE Right 06/27/2019   Procedure: release of capsular contracture with repositioning of the right implant with possible exchange of right breast implant;  Surgeon: Peggye Form, DO;  Location: [facility];  Service: Plastics;  Laterality: Right;    BREAST RECONSTRUCTION WITH PLACEMENT OF TISSUE EXPANDER AND FLEX HD (ACELLULAR HYDRATED DERMIS) Right 11/16/2012   Procedure: RIGHT LATISSIMUS myocutaneous FLAP WITH PLACEMENT OF TISSUE EXPANDER  TO RIGHT BREAST;  Surgeon: Wayland Denis, DO;  Location: [facility] OR;  Service: Plastics;  Laterality: Right;   CARDIAC CATHETERIZATION N/A 02/28/2015   Procedure: Left Heart Cath and Coronary Angiography;  Surgeon: Yates Decamp, MD;  Location: [facility];  Service: Cardiovascular;  Laterality: N/A;   CESAREAN SECTION  2000   ENDOMETRIAL ABLATION W/ NOVASURE  2007   INSERTION OF TISSUE EXPANDER AFTER MASTECTOMY Bilateral 04/20/2011   Breast Ca (r) T1b N0 M0 stage IA infiltrating ductal carcinoma   LAPAROSCOPIC BILATERAL SALPINGO OOPHERECTOMY Bilateral 08/30/2018   Procedure: LAPAROSCOPIC BILATERAL SALPINGO OOPHORECTOMY w/ PERITONEAL  WASHINGS;  Surgeon: Genia Del, MD;  Location: [facility];  Service: Gynecology;  Laterality: Bilateral;   LASER ABLATION OF THE CERVIX  1994   FOR DYSPLASIA   LIPOSUCTION WITH LIPOFILLING Right 03/14/2013   Procedure: LIPOSUCTION WITH LIPOFILLING;  Surgeon: Wayland Denis, DO;  Location: [facility];  Service: Plastics;  Laterality: Right;   LIPOSUCTION WITH LIPOFILLING Right 06/27/2019   Procedure: Fat grafting to right breast with excision of back excess skin;  Surgeon: Peggye Form, DO;  Location: [facility];  Service: Plastics;  Laterality: Right;  2 hours, please   RECONSTRUCTION BREAST W/ LATISSIMUS DORSI FLAP Right 11/16/2012   REMOVAL OF TISSUE EXPANDER AND PLACEMENT OF IMPLANT Right 03/14/2013   Procedure: REMOVAL OF RIGHT BREAST TISSUE EXPANDER WITH PLACEMENT OF RIGHT BREAST IMPLANT;  Surgeon: Wayland Denis, DO;  Location: [facility];  Service: Plastics;  Laterality: Right;  Removal of Right Breast Tissue Expander with Placement of Right Breast Implant, Possible Lipsuction with Lipofilling   TISSUE  EXPANDER PLACEMENT Right 11/16/2012   TISSUE EXPANDER REMOVAL Bilateral 12/2011   TUBAL LIGATION  09/08/2001   BY LAPAROSCOPY ,, HULKA CLIPS TECHNIQUE   WISDOM TOOTH EXTRACTION      Social History: Social History   Socioeconomic History   Marital status: Legally Separated    Spouse name: Not on file   Number of children: 1   Years of education: Not on file   Highest education level: Master's degree (e.g., MA, MS, MEng, MEd, MSW, MBA)  Occupational History   Not on file  Tobacco Use   Smoking status: Never   Smokeless tobacco: Never  Vaping Use   Vaping status: Never Used  Substance and Sexual Activity   Alcohol use: Yes    Alcohol/week: 1.0 standard drink of alcohol    Types: 1 Glasses of wine per week   Drug use: No   Sexual activity: Not Currently    Birth control/protection: Surgical    Comment: 1ST INTERCOURSE- 43, PARTNERS- 4, btl & oophorectomy  Other Topics Concern   Not on file  Social History Narrative   Not on file   Social Determinants of Health   Financial Resource Strain: Low Risk  (12/21/2022)   Overall Financial Resource Strain (CARDIA)    Difficulty of Paying Living Expenses: Not hard at all  Food Insecurity: No Food Insecurity (12/21/2022)   Hunger Vital Sign    Worried About Running Out of Food in the Last Year: Never true    Ran Out of Food in the Last Year: Never true  Transportation Needs: No Transportation Needs (12/21/2022)   PRAPARE - Administrator, Civil Service (Medical): No    Lack of Transportation (Non-Medical): No  Physical Activity: Insufficiently Active (12/21/2022)   Exercise Vital Sign    Days of Exercise per Week: 2 days    Minutes of Exercise per Session: 30 min  Stress: No Stress Concern Present (12/21/2022)   [employer] of Occupational Health - Occupational Stress Questionnaire    Feeling of Stress : Only a little  Social Connections: Moderately Isolated (12/21/2022)   Social Connection and Isolation Panel  [NHANES]    Frequency of Communication with Friends and Family: Three times a week    Frequency of Social Gatherings with Friends and Family: Once a week    Attends Religious Services: 1 to 4 times per year    Active Member of Clubs or Organizations: No    Attends  Banker Meetings: Not on file    Marital Status: Separated  Intimate Partner Violence: Unknown (07/14/2021)   Received from [facility]   HITS    Physically Hurt: Not on file    Insult or Talk Down To: Not on file    Threaten Physical Harm: Not on file    Scream or Curse: Not on file    Family History: Family History  Problem Relation Age of Onset   Diabetes Mother        HAS HAD A KIDNEY TRANSPLATN   Hypertension Mother    Kidney failure Mother    Hypertension Father    Gallstones Father    Hypertension Sister    Gallstones Sister    Hypertension Sister    Colon cancer Paternal Aunt    Lung cancer Maternal Grandfather     Review of Systems: ROS COVID-19 diagnosis and treatment with Paxlovid middle of August, but no since fevers.  She also denies any recent chest pain, difficulty breathing, or leg swelling.  Physical Exam: Vital Signs BP 117/87 (BP Location: Left Arm, Patient Position: Sitting, Cuff Size: Normal)   Pulse 68   SpO2 99%   Physical Exam Constitutional:      General: Not in acute distress.    Appearance: Normal appearance. Not ill-appearing.  HENT:     Head: Normocephalic and atraumatic.  Eyes:     Pupils: Pupils are equal, round. Cardiovascular:     Rate and Rhythm: Normal rate.    Pulses: Normal pulses.  Pulmonary:     Effort: No respiratory distress or increased work of breathing.  Speaks in full sentences. Abdominal:     General: Abdomen is flat. No distension.   Musculoskeletal: Normal range of motion. No lower extremity swelling or edema. No varicosities. Skin:    General: Skin is warm and dry.     Findings: No erythema or rash.  Neurological:      Mental Status: Alert and oriented to person, place, and time.  Psychiatric:        Mood and Affect: Mood normal.        Behavior: Behavior normal.    Assessment/Plan: The patient is scheduled for removal and replacement of bilateral breast implants and bilateral capsulectomy with Dr. Ulice Bold.  Risks, benefits, and alternatives of procedure discussed, questions answered and consent obtained.    Smoking Status: Non-smoker. Last Mammogram: S/p bilateral mastectomy.  Caprini Score: 6; Risk Factors include: Age, BMI greater than 25, history of breast cancer, and length of planned surgery. Recommendation for mechanical prophylaxis. Encourage early ambulation.   Pictures obtained: 11/30/2022.  Post-op Rx sent to pharmacy: Keflex, Zofran, oxycodone.  Patient was provided with the General Surgical Risk consent document and Pain Medication Agreement prior to their appointment.  They had adequate time to read through the risk consent documents and Pain Medication Agreement. We also discussed them in person together during this preop appointment. All of their questions were answered to their satisfaction.  Recommended calling if they have any further questions.  Risk consent form and Pain Medication Agreement to be scanned into patient's chart.  The risks that can be encountered with and after placement of a breast implant placement were discussed and include the following but not limited to these: bleeding, infection, delayed healing, anesthesia risks, skin sensation changes, injury to structures including nerves, blood vessels, and muscles which may be temporary or permanent, allergies to tape, suture materials and glues, blood products, topical preparations or injected  agents, skin contour irregularities, skin discoloration and swelling, deep vein thrombosis, cardiac and pulmonary complications, pain, which may persist, fluid accumulation, wrinkling of the skin over the implant, changes in nipple or  breast sensation, implant leakage or rupture, faulty position of the implant, persistent pain, formation of tight scar tissue around the implant (capsular contracture), possible need for revisional surgery or staged procedures.    Electronically signed by: Evelena Leyden, PA-C 01/05/2023 11:08 AM

## 2023-01-05 ENCOUNTER — Encounter: Payer: Self-pay | Admitting: Physician Assistant

## 2023-01-05 ENCOUNTER — Ambulatory Visit (INDEPENDENT_AMBULATORY_CARE_PROVIDER_SITE_OTHER): Payer: Federal, State, Local not specified - PPO | Admitting: Physician Assistant

## 2023-01-05 ENCOUNTER — Encounter: Payer: Self-pay | Admitting: Plastic Surgery

## 2023-01-05 VITALS — BP 117/87 | HR 68

## 2023-01-05 DIAGNOSIS — N651 Disproportion of reconstructed breast: Secondary | ICD-10-CM

## 2023-01-05 MED ORDER — OXYCODONE HCL 5 MG PO TABS: 5.0000 mg | ORAL_TABLET | Freq: Four times a day (QID) | ORAL | 0 refills | Status: AC | PRN

## 2023-01-05 MED ORDER — ONDANSETRON 4 MG PO TBDP: 4.0000 mg | ORAL_TABLET | Freq: Three times a day (TID) | ORAL | 0 refills | Status: DC | PRN

## 2023-01-05 MED ORDER — CEPHALEXIN 500 MG PO CAPS: 500.0000 mg | ORAL_CAPSULE | Freq: Four times a day (QID) | ORAL | 0 refills | Status: AC

## 2023-01-19 ENCOUNTER — Encounter (HOSPITAL_BASED_OUTPATIENT_CLINIC_OR_DEPARTMENT_OTHER): Payer: Self-pay | Admitting: Plastic Surgery

## 2023-01-19 ENCOUNTER — Other Ambulatory Visit: Payer: Self-pay

## 2023-01-20 ENCOUNTER — Ambulatory Visit (INDEPENDENT_AMBULATORY_CARE_PROVIDER_SITE_OTHER): Payer: Federal, State, Local not specified - PPO | Admitting: Neurology

## 2023-01-20 ENCOUNTER — Encounter: Payer: Self-pay | Admitting: Neurology

## 2023-01-20 VITALS — BP 125/83 | HR 60 | Ht 62.0 in | Wt 158.0 lb

## 2023-01-20 DIAGNOSIS — M2619 Other specified anomalies of jaw-cranial base relationship: Secondary | ICD-10-CM | POA: Insufficient documentation

## 2023-01-20 DIAGNOSIS — G4733 Obstructive sleep apnea (adult) (pediatric): Secondary | ICD-10-CM

## 2023-01-20 NOTE — Progress Notes (Signed)
Provider:  Melvyn Novas, MD  Primary Care Physician:  Dorothyann Peng, MD [address] [place] [address]     Referring Provider: Dorothyann Peng, Md [address] [place],  [place] [address]   PRIMARY NEUROLOGIST: Dr Marjory Lies:          Chief Complaint according to patient   Patient presents with:     New on CPAP            HISTORY OF PRESENT ILLNESS:  Leslie Salazar is a 57 y.o. female patient who is here for revisit 01/20/2023 for  CPAP compliance.  She has questions about Earnest Bailey but is in general happy with CPAP. Her Epworth sleepiness score is currently endorsed at 8 out of 24 points and prior to using CPAP it was 15 out of 24 points.  Her fatigue severity score is still elevated at 43 but she does have other immune compromising disorders diseases etc.  Her compliance is excellent 100% by days and 100% by hours with an average of 6 hours 48 minutes each night.  Her setting are between 4 and 16 cm water and for 95% of the night she will use a pressure of 7.2 cm water which is not high.  Her residual AHI is 0.2/h.  This is an excellent resolution!!  We talked about  CPAP versus inspire , who is a good candidate ( she is ) but also that Inspire patients will  undergo 2 times anesthesia,  have no resolution of hypoxia, and will be undergoing several sleep studies to titrate he device in VOLTAGE output. [place] at the airport as they cannot go through regular magnetome. She has been a great patient and had  clearly clinical benefit.   She may consider a dental device instead.     Leslie Salazar is a 57 y.o.  African American female patient  was seen here in a RV on 12/03/2021.  She has undergone a sleep study and started CPAP therapy. Last seen by MM on 05-29-2021: She recently  traveled to [place], a wonderful trip, but she felt tired- her AHI was well controlled.    She has ischemic heart disease, and Breast cancer, she reached out  to disabled [place] Veterans: she had double mastectomy, reconstructive surgery with latissimus flap. She remains with a restrictive ROM of the right upper extremity and chest wall.  Breast cancer Radiation added higher risk  of fibrosis.  She has trouble to find a comfortable sleep position.  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   Ischemic heart disease is another known risk  factor for OSA and CSA, and can manifest as fatigue after OSA is treated.  She had a positive stress test 2015 , and yearly since.  The patient achieved a 10% disability assignment in November 2015 based on workload of greater than 7 METS but not greater than 10 METS resulting in dyspnea, fatigue, angina, lightheadedness or presyncope-syncope.  She has a left ventricular known dysfunction with an ejection fraction of more than 50%.  At the time of a higher evaluation of 30% was not warranted for arteriosclerotic heart disease but since then she had EKGs and echocardiograms that have shown progression of her ischemia. Her OSA  is more than likely related to her service related ischemic heart disease and HTN.    The patient endorsed the Epworth sleepiness scale today at 14 points and she felt the sleepiness while watching television and less sleepy when sitting and driving or when stopped in traffic.   CPAP compliance was 90% 27 out of 30 days with an average use at time of 6 hours 31 minutes.  The minimum pressure is set at 6 and maximum pressure at 16 cm water pressure the patient's average pressure at night at the 95th percentile was 7.4 cm water.  Average air leak was 1.3 L/min.  The residual AHI was 0.2 the average central AHI was 0.1.  This is a very good reduction of the baseline sleep apnea  with her which had reached just under 20 AHI.  I like for the patient to continue using her CPAP under the current settings and she seems to have achieved a good mask fit as there is no significant air leakage.     I would like her to follow-up through my nurse practitioner in 6-8 months when she has news from the [place].  05/29/21: Primary Neurologist is Dr. Marjory Lies, MD.   Chief concern according to patient :  non restorative sleep, worse since oophorectomy. Her Sister's have witnessed her snoring, and gasping in her sleep. Hot flushes, sleep headaches, palpitations.    Leslie Salazar  has a past medical history of Acne, breast Cancer (HCC) (2012), mastectomy bilateral- 04/2011. Complication of anesthesia, History of angina, hx of radiation therapy (2013),Condyloma, DCIS (ductal carcinoma in situ), Fibroids, Heart palpitations, High cholesterol, , Hypertension, Left ovarian cyst, Scoliosis, Urinary urgency, and non restorative sleep. Ovariectomy, 2021-Medical menopause, 6.5 years on tamoxifen and related Weight gain.              Review of Systems: Out of a complete 14 system review, the patient complains of only the following symptoms, and all other reviewed systems are negative.:     How likely are you to doze in the following situations: 0 = not likely, 1 = slight chance, 2 = moderate chance, 3 = high chance   Sitting and Reading? Watching Television? Sitting inactive in a public place (theater or meeting)? As a passenger in a car for an hour without a break? Lying down in the afternoon when circumstances permit? Sitting and talking to someone? Sitting quietly after lunch without alcohol? In a car, while stopped for a few minutes in traffic?   Total = 8/ 24 points   FSS endorsed at 43/ 63 points.   Social History   Socioeconomic History   Marital status: Legally Separated    Spouse name: Not on file   Number of children: 1   Years of education: Not on file   Highest  education level: Master's degree (e.g., MA, MS, MEng, MEd, MSW, MBA)  Occupational History   Not on file  Tobacco Use   Smoking status: Never   Smokeless tobacco: Never  Vaping Use   Vaping status: Never Used  Substance and Sexual Activity   Alcohol use: Yes    Alcohol/week: 1.0 standard drink of alcohol    Types: 1 Glasses of wine per week    Comment: social   Drug use: No   Sexual activity: Not Currently    Birth control/protection: Surgical    Comment: 1ST INTERCOURSE- 65, PARTNERS- 4, btl & oophorectomy  Other Topics Concern   Not on file  Social History Narrative  Not on file   Social Determinants of Health   Financial Resource Strain: Low Risk  (12/21/2022)   Overall Financial Resource Strain (CARDIA)    Difficulty of Paying Living Expenses: Not hard at all  Food Insecurity: No Food Insecurity (12/21/2022)   Hunger Vital Sign    Worried About Running Out of Food in the Last Year: Never true    Ran Out of Food in the Last Year: Never true  Transportation Needs: No Transportation Needs (12/21/2022)   PRAPARE - Administrator, Civil Service (Medical): No    Lack of Transportation (Non-Medical): No  Physical Activity: Insufficiently Active (12/21/2022)   Exercise Vital Sign    Days of Exercise per Week: 2 days    Minutes of Exercise per Session: 30 min  Stress: No Stress Concern Present (12/21/2022)   [employer] of Occupational Health - Occupational Stress Questionnaire    Feeling of Stress : Only a little  Social Connections: Moderately Isolated (12/21/2022)   Social Connection and Isolation Panel [NHANES]    Frequency of Communication with Friends and Family: Three times a week    Frequency of Social Gatherings with Friends and Family: Once a week    Attends Religious Services: 1 to 4 times per year    Active Member of [employer] or Organizations: No    Attends Engineer, structural: Not on file    Marital Status: Separated    Family History   Problem Relation Age of Onset   Diabetes Mother        HAS HAD A KIDNEY TRANSPLATN   Hypertension Mother    Kidney failure Mother    Hypertension Father    Gallstones Father    Hypertension Sister    Gallstones Sister    Hypertension Sister    Colon cancer Paternal Aunt    Lung cancer Maternal Grandfather     Past Medical History:  Diagnosis Date   Acne    Cancer (HCC) 2012   RIGHT DUCTAL CARCINOMA INSITU.Marland Kitchen   Complication of anesthesia    Condyloma    ON BUTTOCKS   DCIS (ductal carcinoma in situ)    BRCA 1and 2 Neg./Pos ER, Pos PR   Fibroids    Heart palpitations    High cholesterol    History of angina    HSV-1 (herpes simplex virus 1) infection    Hx of radiation therapy 2013   from april to may - six weeks   Hypertension    Left ovarian cyst    PONV (postoperative nausea and vomiting)    nausea no vomiting   Scoliosis    Sleep apnea    c- pap nightly   Urinary urgency    Wears glasses     Past Surgical History:  Procedure Laterality Date   BREAST CAPSULECTOMY WITH IMPLANT EXCHANGE Right 06/27/2019   Procedure: release of capsular contracture with repositioning of the right implant with possible exchange of right breast implant;  Surgeon: Peggye Form, DO;  Location: [facility];  Service: Plastics;  Laterality: Right;   BREAST RECONSTRUCTION WITH PLACEMENT OF TISSUE EXPANDER AND FLEX HD (ACELLULAR HYDRATED DERMIS) Right 11/16/2012   Procedure: RIGHT LATISSIMUS myocutaneous FLAP WITH PLACEMENT OF TISSUE EXPANDER  TO RIGHT BREAST;  Surgeon: Wayland Denis, DO;  Location: [facility] OR;  Service: Plastics;  Laterality: Right;   CARDIAC CATHETERIZATION N/A 02/28/2015   Procedure: Left Heart Cath and Coronary Angiography;  Surgeon: Yates Decamp, MD;  Location: [facility] INVASIVE CV  LAB;  Service: Cardiovascular;  Laterality: N/A;   CESAREAN SECTION  2000   ENDOMETRIAL ABLATION W/ NOVASURE  2007   INSERTION OF TISSUE EXPANDER AFTER MASTECTOMY Bilateral 04/20/2011    Breast Ca (r) T1b N0 M0 stage IA infiltrating ductal carcinoma   LAPAROSCOPIC BILATERAL SALPINGO OOPHERECTOMY Bilateral 08/30/2018   Procedure: LAPAROSCOPIC BILATERAL SALPINGO OOPHORECTOMY w/ PERITONEAL WASHINGS;  Surgeon: Genia Del, MD;  Location: [facility];  Service: Gynecology;  Laterality: Bilateral;   LASER ABLATION OF THE CERVIX  1994   FOR DYSPLASIA   LIPOSUCTION WITH LIPOFILLING Right 03/14/2013   Procedure: LIPOSUCTION WITH LIPOFILLING;  Surgeon: Wayland Denis, DO;  Location: [facility];  Service: Plastics;  Laterality: Right;   LIPOSUCTION WITH LIPOFILLING Right 06/27/2019   Procedure: Fat grafting to right breast with excision of back excess skin;  Surgeon: Peggye Form, DO;  Location: [facility];  Service: Plastics;  Laterality: Right;  2 hours, please   RECONSTRUCTION BREAST W/ LATISSIMUS DORSI FLAP Right 11/16/2012   REMOVAL OF TISSUE EXPANDER AND PLACEMENT OF IMPLANT Right 03/14/2013   Procedure: REMOVAL OF RIGHT BREAST TISSUE EXPANDER WITH PLACEMENT OF RIGHT BREAST IMPLANT;  Surgeon: Wayland Denis, DO;  Location: [facility];  Service: Plastics;  Laterality: Right;  Removal of Right Breast Tissue Expander with Placement of Right Breast Implant, Possible Lipsuction with Lipofilling   TISSUE EXPANDER PLACEMENT Right 11/16/2012   TISSUE EXPANDER REMOVAL Bilateral 12/2011   TUBAL LIGATION  09/08/2001   BY LAPAROSCOPY ,, HULKA CLIPS TECHNIQUE   WISDOM TOOTH EXTRACTION       Current Outpatient Medications on File Prior to Visit  Medication Sig Dispense Refill   aspirin EC 81 MG tablet Take 81 mg by mouth every morning.     atorvastatin (LIPITOR) 20 MG tablet TAKE 1 TABLET(20 MG) BY MOUTH DAILY 90 tablet 2   Cholecalciferol 25 MCG (1000 UT) tablet Take 1 tablet by mouth daily.     Coenzyme Q10 100 MG capsule Take 1 capsule by mouth daily.     hydrochlorothiazide (MICROZIDE) 12.5 MG capsule TAKE 1 CAPSULE(12.5 MG)  BY MOUTH DAILY 90 capsule 1   hydrocortisone (ANUSOL-HC) 2.5 % rectal cream Place 1 application rectally 2 (two) times daily. 30 g 3   loratadine (CLARITIN) 10 MG tablet Take 10 mg by mouth every morning.      metoprolol succinate (TOPROL-XL) 25 MG 24 hr tablet TAKE 1 TABLET(25 MG) BY MOUTH DAILY 90 tablet 2   montelukast (SINGULAIR) 10 MG tablet TAKE 1 TABLET(10 MG) BY MOUTH DAILY 90 tablet 2   Multiple Vitamins-Minerals (MULTIVITAMIN WOMENS 50+ ADV PO) Take by mouth.     Omega-3 Fatty Acids (FISH OIL PO) Take by mouth. 1 capsule daily     ondansetron (ZOFRAN-ODT) 4 MG disintegrating tablet Take 1 tablet (4 mg total) by mouth every 8 (eight) hours as needed for nausea or vomiting. 20 tablet 0   traZODone (DESYREL) 50 MG tablet TAKE 1 TABLET(50 MG) BY MOUTH AT BEDTIME AS NEEDED FOR SLEEP 90 tablet 1   valACYclovir (VALTREX) 1000 MG tablet Take 1 tablet (1,000 mg total) by mouth 2 (two) times daily. (Patient taking differently: Take 1,000 mg by mouth daily.) 60 tablet 0   No current facility-administered medications on file prior to visit.    No Known Allergies   DIAGNOSTIC DATA (LABS, IMAGING, TESTING) - I reviewed patient records, labs, notes, testing and imaging myself where available.  Lab Results  Component Value Date  WBC 6.0 10/11/2022   HGB 14.4 10/11/2022   HCT 44.2 10/11/2022   MCV 91 10/11/2022   PLT 277 10/11/2022      Component Value Date/Time   NA 140 10/11/2022 1112   K 4.5 10/11/2022 1112   CL 101 10/11/2022 1112   CO2 25 10/11/2022 1112   GLUCOSE 91 10/11/2022 1112   GLUCOSE 96 06/22/2019 0936   BUN 15 10/11/2022 1112   CREATININE 0.86 10/11/2022 1112   CALCIUM 10.5 (H) 10/11/2022 1112   PROT 7.5 10/11/2022 1112   ALBUMIN 4.6 10/11/2022 1112   AST 18 10/11/2022 1112   ALT 26 10/11/2022 1112   ALKPHOS 98 10/11/2022 1112   BILITOT 0.7 10/11/2022 1112   GFRNONAA 78 03/12/2020 1528   GFRAA 90 03/12/2020 1528   Lab Results  Component Value Date   CHOL 152  10/11/2022   HDL 61 10/11/2022   LDLCALC 79 10/11/2022   TRIG 55 10/11/2022   CHOLHDL 2.5 10/11/2022   Lab Results  Component Value Date   HGBA1C 5.4 03/25/2021   Lab Results  Component Value Date   VITAMINB12 1,118 06/17/2020   Lab Results  Component Value Date   TSH 1.120 10/11/2022    PHYSICAL EXAM:  Today's Vitals   01/20/23 0932  BP: 125/83  Pulse: 60  Weight: 158 lb (71.7 kg)  Height: 5\' 2"  (1.575 m)   Body mass index is 28.9 kg/m.   Wt Readings from Last 3 Encounters:  01/20/23 158 lb (71.7 kg)  12/22/22 158 lb 9.6 oz (71.9 kg)  10/11/22 159 lb 3.2 oz (72.2 kg)     Ht Readings from Last 3 Encounters:  01/20/23 5\' 2"  (1.575 m)  12/22/22 5' 2.5" (1.588 m)  10/11/22 5' 2.25" (1.581 m)      General: The patient is awake, alert and appears not in acute distress. The patient is well groomed. Head: Normocephalic, atraumatic. Neck is supple. Mallampati 3 plus ,  neck circumference:15 inches . Nasal airflow  patent.  Retrognathia is present .  Dental status: small lower jaw, scalloped tongue.  Cardiovascular:  Regular rate and cardiac rhythm by pulse,  without distended neck veins. Respiratory: Lungs are clear to auscultation.  Skin:  Without evidence of ankle edema, or rash. Trunk: The patient's posture is erect.   Neurologic exam : The patient is awake and alert, oriented to place and time.   Memory subjective described as intact.  Attention span & concentration ability appears normal.  Speech is fluent,  without  dysarthria, dysphonia or aphasia.  Mood and affect are appropriate.   Cranial nerves: no loss of smell or taste reported  Pupils are equal and briskly reactive to light. Funduscopic exam deferred. .  Extraocular movements in vertical and horizontal planes were intact and without nystagmus. No Diplopia. Visual fields by finger perimetry are intact. Hearing was intact to soft voice and finger rubbing.    Facial sensation intact to fine touch.   Facial motor strength is symmetric and tongue and uvula move midline.  Neck ROM : rotation, tilt and flexion extension were normal for age and shoulder shrug was symmetrical.    Motor exam:  Symmetric bulk, tone and ROM.   Normal tone without cog -wheeling, symmetric grip strength .   Sensory:  Fine touch and vibration were  normal.  Proprioception tested in the upper extremities was normal.   Coordination: Rapid alternating movements in the fingers/hands were of normal speed.  The Finger-to-nose maneuver was intact without evidence  of ataxia, dysmetria or tremor.   Gait and station: Patient could rise unassisted from a seated position, walked without assistive device.  Stance is of normal width/ base and the patient turned with 3 steps ( RN observation) .  Toe and heel walk were deferred.  Deep tendon reflexes: in the  upper and lower extremities are symmetric and intact.  Babinski response was deferred.    ASSESSMENT AND PLAN 57 y.o. year old female patient of Dr Allyne Gee, Dr Marjory Lies ,  here with: 1) OSA on CPAP . 2) retrognathia.     1) high CPAP compliance in treatment of OSA.   2) better sleep , less EDS and good apnea resolution.   I will offer her to increase the minimum pressure setting so she feels airflow at onset.  She is interested in a travel CPAP that can be easier to travel with, this would be set to 7.5 cm water pressure. Same mask.   3) can also consider a dental device for alternative to CPAP.     I plan to follow up with NP within 12 months.   I would like to thank Dorothyann Peng, MD and Dorothyann Peng, Md [address] [place] [address] [place],  [place] [address] for allowing me to meet with and to take care of this pleasant patient.     After spending a total time of  28  minutes face to face and additional time for physical and neurologic examination, review of laboratory studies,  personal review of imaging studies, reports and results of other testing and  review of referral information / records as far as provided in visit,   Electronically signed by: Melvyn Novas, MD 01/20/2023 9:53 AM  [facility] Board certified by The [employer] of Sleep Medicine and Diplomate of the [employer] of Sleep Medicine. Board certified In Neurology through the ABPN, Fellow of the [employer] of Neurology.

## 2023-01-21 ENCOUNTER — Encounter: Payer: Self-pay | Admitting: Internal Medicine

## 2023-01-22 ENCOUNTER — Other Ambulatory Visit: Payer: Self-pay | Admitting: Internal Medicine

## 2023-01-24 ENCOUNTER — Encounter (HOSPITAL_BASED_OUTPATIENT_CLINIC_OR_DEPARTMENT_OTHER)
Admission: RE | Admit: 2023-01-24 | Discharge: 2023-01-24 | Disposition: A | Discharge status: Home / Self Care | Payer: Federal, State, Local not specified - PPO | Source: Ambulatory Visit | Attending: Plastic Surgery | Admitting: Plastic Surgery

## 2023-01-24 DIAGNOSIS — Z01812 Encounter for preprocedural laboratory examination: Secondary | ICD-10-CM | POA: Insufficient documentation

## 2023-01-24 DIAGNOSIS — T8549XA Other mechanical complication of breast prosthesis and implant, initial encounter: Secondary | ICD-10-CM | POA: Diagnosis not present

## 2023-01-24 DIAGNOSIS — Z923 Personal history of irradiation: Secondary | ICD-10-CM | POA: Diagnosis not present

## 2023-01-24 DIAGNOSIS — I10 Essential (primary) hypertension: Secondary | ICD-10-CM | POA: Diagnosis not present

## 2023-01-24 DIAGNOSIS — R519 Headache, unspecified: Secondary | ICD-10-CM | POA: Diagnosis not present

## 2023-01-24 DIAGNOSIS — I259 Chronic ischemic heart disease, unspecified: Secondary | ICD-10-CM | POA: Diagnosis not present

## 2023-01-24 DIAGNOSIS — Y831 Surgical operation with implant of artificial internal device as the cause of abnormal reaction of the patient, or of later complication, without mention of misadventure at the time of the procedure: Secondary | ICD-10-CM | POA: Diagnosis not present

## 2023-01-24 DIAGNOSIS — Z7982 Long term (current) use of aspirin: Secondary | ICD-10-CM | POA: Diagnosis not present

## 2023-01-24 DIAGNOSIS — G4733 Obstructive sleep apnea (adult) (pediatric): Secondary | ICD-10-CM | POA: Diagnosis not present

## 2023-01-24 DIAGNOSIS — Z9013 Acquired absence of bilateral breasts and nipples: Secondary | ICD-10-CM | POA: Diagnosis not present

## 2023-01-24 DIAGNOSIS — Z853 Personal history of malignant neoplasm of breast: Secondary | ICD-10-CM | POA: Diagnosis not present

## 2023-01-24 DIAGNOSIS — E7800 Pure hypercholesterolemia, unspecified: Secondary | ICD-10-CM | POA: Diagnosis not present

## 2023-01-24 LAB — BASIC METABOLIC PANEL
Anion gap: 7 (ref 5–15)
BUN: 14 mg/dL (ref 6–20)
CO2: 28 mmol/L (ref 22–32)
Calcium: 9.7 mg/dL (ref 8.9–10.3)
Chloride: 104 mmol/L (ref 98–111)
Creatinine, Ser: 0.76 mg/dL (ref 0.44–1.00)
GFR, Estimated: >60 mL/min (ref >60)
Glucose, Bld: 89 mg/dL (ref 70–99)
Potassium: 3.7 mmol/L (ref 3.5–5.1)
Sodium: 139 mmol/L (ref 135–145)

## 2023-01-26 DIAGNOSIS — G4733 Obstructive sleep apnea (adult) (pediatric): Secondary | ICD-10-CM | POA: Diagnosis not present

## 2023-01-27 ENCOUNTER — Encounter (HOSPITAL_BASED_OUTPATIENT_CLINIC_OR_DEPARTMENT_OTHER): Payer: Self-pay | Admitting: Plastic Surgery

## 2023-01-27 ENCOUNTER — Encounter (HOSPITAL_BASED_OUTPATIENT_CLINIC_OR_DEPARTMENT_OTHER)
Admission: RE | Disposition: A | Discharge status: Home / Self Care | Payer: Self-pay | Source: Home / Self Care | Attending: Plastic Surgery

## 2023-01-27 ENCOUNTER — Ambulatory Visit (HOSPITAL_BASED_OUTPATIENT_CLINIC_OR_DEPARTMENT_OTHER): Payer: Federal, State, Local not specified - PPO | Admitting: Anesthesiology

## 2023-01-27 ENCOUNTER — Other Ambulatory Visit: Payer: Self-pay

## 2023-01-27 ENCOUNTER — Ambulatory Visit (HOSPITAL_BASED_OUTPATIENT_CLINIC_OR_DEPARTMENT_OTHER)
Admission: RE | Admit: 2023-01-27 | Discharge: 2023-01-27 | Disposition: A | Discharge status: Home / Self Care | Payer: Federal, State, Local not specified - PPO | Attending: Plastic Surgery | Admitting: Plastic Surgery

## 2023-01-27 DIAGNOSIS — Z421 Encounter for breast reconstruction following mastectomy: Secondary | ICD-10-CM

## 2023-01-27 DIAGNOSIS — I259 Chronic ischemic heart disease, unspecified: Secondary | ICD-10-CM | POA: Insufficient documentation

## 2023-01-27 DIAGNOSIS — Z923 Personal history of irradiation: Secondary | ICD-10-CM | POA: Diagnosis not present

## 2023-01-27 DIAGNOSIS — Z7982 Long term (current) use of aspirin: Secondary | ICD-10-CM | POA: Diagnosis not present

## 2023-01-27 DIAGNOSIS — Z853 Personal history of malignant neoplasm of breast: Secondary | ICD-10-CM | POA: Insufficient documentation

## 2023-01-27 DIAGNOSIS — T8549XA Other mechanical complication of breast prosthesis and implant, initial encounter: Secondary | ICD-10-CM | POA: Diagnosis not present

## 2023-01-27 DIAGNOSIS — Z9013 Acquired absence of bilateral breasts and nipples: Secondary | ICD-10-CM | POA: Insufficient documentation

## 2023-01-27 DIAGNOSIS — R519 Headache, unspecified: Secondary | ICD-10-CM | POA: Diagnosis not present

## 2023-01-27 DIAGNOSIS — I10 Essential (primary) hypertension: Secondary | ICD-10-CM | POA: Diagnosis not present

## 2023-01-27 DIAGNOSIS — E7800 Pure hypercholesterolemia, unspecified: Secondary | ICD-10-CM | POA: Insufficient documentation

## 2023-01-27 DIAGNOSIS — Y831 Surgical operation with implant of artificial internal device as the cause of abnormal reaction of the patient, or of later complication, without mention of misadventure at the time of the procedure: Secondary | ICD-10-CM | POA: Insufficient documentation

## 2023-01-27 DIAGNOSIS — G4733 Obstructive sleep apnea (adult) (pediatric): Secondary | ICD-10-CM | POA: Insufficient documentation

## 2023-01-27 HISTORY — PX: PLACEMENT OF BREAST IMPLANTS: SHX6334

## 2023-01-27 HISTORY — PX: CAPSULECTOMY: SHX5381

## 2023-01-27 SURGERY — INSERTION, IMPLANT, BREAST
Anesthesia: General | Site: Chest | Laterality: Bilateral

## 2023-01-27 MED ORDER — OXYCODONE HCL 5 MG PO TABS
5.0000 mg | ORAL_TABLET | Freq: Once | ORAL | Status: AC | PRN
  Administered 2023-01-27: 5 mg via ORAL

## 2023-01-27 MED ORDER — FENTANYL CITRATE (PF) 100 MCG/2ML IJ SOLN
INTRAMUSCULAR | Status: AC
  Filled 2023-01-27: qty 2

## 2023-01-27 MED ORDER — LIDOCAINE-EPINEPHRINE 1 %-1:100000 IJ SOLN
INTRAMUSCULAR | Status: DC | PRN
  Administered 2023-01-27: 10 mL

## 2023-01-27 MED ORDER — CEFAZOLIN SODIUM-DEXTROSE 2-4 GM/100ML-% IV SOLN
INTRAVENOUS | Status: AC
  Filled 2023-01-27: qty 100

## 2023-01-27 MED ORDER — SODIUM CHLORIDE 0.9% FLUSH: 3.0000 mL | INTRAVENOUS | Status: DC | PRN

## 2023-01-27 MED ORDER — OXYCODONE HCL 5 MG PO TABS: 5.0000 mg | ORAL_TABLET | ORAL | Status: DC | PRN

## 2023-01-27 MED ORDER — ONDANSETRON HCL 4 MG/2ML IJ SOLN
INTRAMUSCULAR | Status: DC | PRN
  Administered 2023-01-27: 4 mg via INTRAVENOUS

## 2023-01-27 MED ORDER — FENTANYL CITRATE (PF) 100 MCG/2ML IJ SOLN
INTRAMUSCULAR | Status: DC | PRN
  Administered 2023-01-27 (×2): 50 ug via INTRAVENOUS

## 2023-01-27 MED ORDER — LIDOCAINE HCL (CARDIAC) PF 100 MG/5ML IV SOSY
PREFILLED_SYRINGE | INTRAVENOUS | Status: DC | PRN
  Administered 2023-01-27: 60 mg via INTRAVENOUS

## 2023-01-27 MED ORDER — OXYCODONE HCL 5 MG/5ML PO SOLN: 5.0000 mg | Freq: Once | ORAL | Status: AC | PRN

## 2023-01-27 MED ORDER — EPHEDRINE SULFATE (PRESSORS) 50 MG/ML IJ SOLN
INTRAMUSCULAR | Status: DC | PRN
Start: 2023-01-27 — End: 2023-01-27
  Administered 2023-01-27: 10 mg via INTRAVENOUS

## 2023-01-27 MED ORDER — VASHE WOUND IRRIGATION OPTIME
TOPICAL | Status: DC | PRN
  Administered 2023-01-27: 34 [oz_av]

## 2023-01-27 MED ORDER — CHLORHEXIDINE GLUCONATE CLOTH 2 % EX PADS: 6.0000 | MEDICATED_PAD | Freq: Once | CUTANEOUS | Status: DC

## 2023-01-27 MED ORDER — SCOPOLAMINE 1 MG/3DAYS TD PT72
MEDICATED_PATCH | TRANSDERMAL | Status: AC
  Filled 2023-01-27: qty 1

## 2023-01-27 MED ORDER — FENTANYL CITRATE (PF) 100 MCG/2ML IJ SOLN: 25.0000 ug | INTRAMUSCULAR | Status: DC | PRN

## 2023-01-27 MED ORDER — DROPERIDOL 2.5 MG/ML IJ SOLN
INTRAMUSCULAR | Status: DC | PRN
Start: 2023-01-27 — End: 2023-01-27
  Administered 2023-01-27: .625 mg via INTRAVENOUS

## 2023-01-27 MED ORDER — ACETAMINOPHEN 325 MG PO TABS: 650.0000 mg | ORAL_TABLET | ORAL | Status: DC | PRN

## 2023-01-27 MED ORDER — DEXAMETHASONE SODIUM PHOSPHATE 4 MG/ML IJ SOLN
INTRAMUSCULAR | Status: DC | PRN
  Administered 2023-01-27: 5 mg via INTRAVENOUS

## 2023-01-27 MED ORDER — MIDAZOLAM HCL 2 MG/2ML IJ SOLN
INTRAMUSCULAR | Status: AC
  Filled 2023-01-27: qty 2

## 2023-01-27 MED ORDER — CEFAZOLIN SODIUM-DEXTROSE 2-4 GM/100ML-% IV SOLN
2.0000 g | INTRAVENOUS | Status: AC
  Administered 2023-01-27: 2 g via INTRAVENOUS

## 2023-01-27 MED ORDER — MIDAZOLAM HCL 5 MG/5ML IJ SOLN
INTRAMUSCULAR | Status: DC | PRN
  Administered 2023-01-27: 2 mg via INTRAVENOUS

## 2023-01-27 MED ORDER — ACETAMINOPHEN 325 MG RE SUPP: 650.0000 mg | RECTAL | Status: DC | PRN

## 2023-01-27 MED ORDER — LACTATED RINGERS IV SOLN: INTRAVENOUS | Status: DC

## 2023-01-27 MED ORDER — PROPOFOL 10 MG/ML IV BOLUS
INTRAVENOUS | Status: DC | PRN
  Administered 2023-01-27: 200 mg via INTRAVENOUS

## 2023-01-27 MED ORDER — OXYCODONE HCL 5 MG PO TABS
ORAL_TABLET | ORAL | Status: AC
  Filled 2023-01-27: qty 1

## 2023-01-27 MED ORDER — SODIUM CHLORIDE 0.9% FLUSH: 3.0000 mL | Freq: Two times a day (BID) | INTRAVENOUS | Status: DC

## 2023-01-27 MED ORDER — FENTANYL CITRATE (PF) 100 MCG/2ML IJ SOLN
25.0000 ug | INTRAMUSCULAR | Status: DC | PRN
  Administered 2023-01-27: 50 ug via INTRAVENOUS

## 2023-01-27 MED ORDER — ONDANSETRON HCL 4 MG/2ML IJ SOLN: 4.0000 mg | Freq: Four times a day (QID) | INTRAMUSCULAR | Status: DC | PRN

## 2023-01-27 SURGICAL SUPPLY — 77 items
ADH SKN CLS APL DERMABOND .7 (GAUZE/BANDAGES/DRESSINGS) ×4
BAG DECANTER FOR FLEXI CONT (MISCELLANEOUS) ×2 IMPLANT
BINDER BREAST LRG (GAUZE/BANDAGES/DRESSINGS) IMPLANT
BINDER BREAST MEDIUM (GAUZE/BANDAGES/DRESSINGS) IMPLANT
BINDER BREAST XLRG (GAUZE/BANDAGES/DRESSINGS) IMPLANT
BINDER BREAST XXLRG (GAUZE/BANDAGES/DRESSINGS) IMPLANT
BIOPATCH RED 1 DISK 7.0 (GAUZE/BANDAGES/DRESSINGS) IMPLANT
BLADE HEX COATED 2.75 (ELECTRODE) ×2 IMPLANT
BLADE SURG 15 STRL LF DISP TIS (BLADE) ×4 IMPLANT
BLADE SURG 15 STRL SS (BLADE) ×2
BNDG GAUZE DERMACEA FLUFF 4 (GAUZE/BANDAGES/DRESSINGS) ×4 IMPLANT
BNDG GZE DERMACEA 4 6PLY (GAUZE/BANDAGES/DRESSINGS)
CANISTER SUCT 1200ML W/VALVE (MISCELLANEOUS) ×2 IMPLANT
CLEANSER WND VASHE 34 (WOUND CARE) IMPLANT
COVER BACK TABLE 60X90IN (DRAPES) ×2 IMPLANT
COVER MAYO STAND STRL (DRAPES) ×2 IMPLANT
DERMABOND ADVANCED .7 DNX12 (GAUZE/BANDAGES/DRESSINGS) IMPLANT
DRAIN CHANNEL 19F RND (DRAIN) IMPLANT
DRAPE LAPAROSCOPIC ABDOMINAL (DRAPES) ×2 IMPLANT
DRESSING MEPILEX FLEX 4X4 (GAUZE/BANDAGES/DRESSINGS) IMPLANT
DRSG MEPILEX FLEX 4X4 (GAUZE/BANDAGES/DRESSINGS) ×4
ELECT BLADE 4.0 EZ CLEAN MEGAD (MISCELLANEOUS) ×2
ELECT BLADE 6.5 EXT (BLADE) IMPLANT
ELECT REM PT RETURN 9FT ADLT (ELECTROSURGICAL) ×2
ELECTRODE BLDE 4.0 EZ CLN MEGD (MISCELLANEOUS) ×2 IMPLANT
ELECTRODE REM PT RTRN 9FT ADLT (ELECTROSURGICAL) ×2 IMPLANT
EVACUATOR SILICONE 100CC (DRAIN) IMPLANT
FUNNEL KELLER 2 DISP (MISCELLANEOUS) IMPLANT
GAUZE PAD ABD 8X10 STRL (GAUZE/BANDAGES/DRESSINGS) ×4 IMPLANT
GAUZE SPONGE 4X4 12PLY STRL LF (GAUZE/BANDAGES/DRESSINGS) IMPLANT
GLOVE BIO SURGEON STRL SZ 6.5 (GLOVE) ×4 IMPLANT
GOWN STRL REUS W/ TWL LRG LVL3 (GOWN DISPOSABLE) ×6 IMPLANT
GOWN STRL REUS W/TWL LRG LVL3 (GOWN DISPOSABLE) ×6
IMPL GEL HI PROFILE 350CC (Breast) IMPLANT
IMPL GEL HI PROFILE 375CC (Breast) IMPLANT
IMPLANT GEL HI PROFILE 350CC (Breast) ×2 IMPLANT
IMPLANT GEL HI PROFILE 375CC (Breast) ×2 IMPLANT
IV NS 1000ML (IV SOLUTION)
IV NS 1000ML BAXH (IV SOLUTION) IMPLANT
IV NS 500ML (IV SOLUTION)
IV NS 500ML BAXH (IV SOLUTION) ×2 IMPLANT
KIT FILL ASEPTIC TRANSFER (MISCELLANEOUS) IMPLANT
NDL HYPO 25X1 1.5 SAFETY (NEEDLE) ×2 IMPLANT
NDL SPNL 18GX3.5 QUINCKE PK (NEEDLE) IMPLANT
NEEDLE HYPO 25X1 1.5 SAFETY (NEEDLE) ×2
NEEDLE SPNL 18GX3.5 QUINCKE PK (NEEDLE)
NS IRRIG 1000ML POUR BTL (IV SOLUTION) ×2 IMPLANT
PACK BASIN DAY SURGERY FS (CUSTOM PROCEDURE TRAY) ×2 IMPLANT
PENCIL SMOKE EVACUATOR (MISCELLANEOUS) ×2 IMPLANT
PIN SAFETY STERILE (MISCELLANEOUS) IMPLANT
SIZER BREAST REUSE 375CC (SIZER) ×2
SIZER BRST REUSE P4.8 12 375CC (SIZER) IMPLANT
SLEEVE SCD COMPRESS KNEE MED (STOCKING) ×2 IMPLANT
SPIKE FLUID TRANSFER (MISCELLANEOUS) IMPLANT
SPONGE T-LAP 18X18 ~~LOC~~+RFID (SPONGE) ×4 IMPLANT
STAPLER VISISTAT 35W (STAPLE) IMPLANT
STRIP CLOSURE SKIN 1/2X4 (GAUZE/BANDAGES/DRESSINGS) IMPLANT
STRIP SUTURE WOUND CLOSURE 1/2 (MISCELLANEOUS) IMPLANT
SUT MNCRL AB 4-0 PS2 18 (SUTURE) ×2 IMPLANT
SUT MON AB 3-0 SH 27 (SUTURE) ×4
SUT MON AB 3-0 SH27 (SUTURE) ×2 IMPLANT
SUT MON AB 5-0 PS2 18 (SUTURE) ×4 IMPLANT
SUT PDS 3-0 CT2 (SUTURE) ×8
SUT PDS AB 2-0 CT2 27 (SUTURE) IMPLANT
SUT PDS II 3-0 CT2 27 ABS (SUTURE) IMPLANT
SUT SILK 3 0 PS 1 (SUTURE) IMPLANT
SUT VIC AB 3-0 SH 27 (SUTURE)
SUT VIC AB 3-0 SH 27X BRD (SUTURE) IMPLANT
SUT VIC AB 4-0 PS2 18 (SUTURE) IMPLANT
SYR 50ML LL SCALE MARK (SYRINGE) IMPLANT
SYR BULB IRRIG 60ML STRL (SYRINGE) ×2 IMPLANT
SYR CONTROL 10ML LL (SYRINGE) ×2 IMPLANT
TOWEL GREEN STERILE FF (TOWEL DISPOSABLE) ×4 IMPLANT
TRAY DSU PREP LF (CUSTOM PROCEDURE TRAY) ×2 IMPLANT
TUBE CONNECTING 20X1/4 (TUBING) ×2 IMPLANT
UNDERPAD 30X36 HEAVY ABSORB (UNDERPADS AND DIAPERS) ×4 IMPLANT
YANKAUER SUCT BULB TIP NO VENT (SUCTIONS) ×2 IMPLANT

## 2023-01-27 NOTE — Transfer of Care (Signed)
Immediate Anesthesia Transfer of Care Note  Patient: Leslie Salazar  Procedure(s) Performed: PLACEMENT OF BREAST IMPLANTS (Bilateral: Breast) [id number] (Bilateral: Chest)  Patient Location: PACU  Anesthesia Type:General  Level of Consciousness: awake, alert , oriented, drowsy, and patient cooperative  Airway & Oxygen Therapy: Patient Spontanous Breathing and Patient connected to face mask oxygen  Post-op Assessment: Report given to RN and Post -op Vital signs reviewed and stable  Post vital signs: Reviewed and stable  Last Vitals:  Vitals Value Taken Time  BP 147/91 01/27/23 1112  Temp    Pulse 106 01/27/23 1113  Resp 22 01/27/23 1113  SpO2 99 % 01/27/23 1113  Vitals shown include unfiled device data.  Last Pain:  Vitals:   01/27/23 0718  TempSrc: Temporal  PainSc: 0-No pain         Complications: No notable events documented.

## 2023-01-27 NOTE — Interval H&P Note (Signed)
History and Physical Interval Note:  01/27/2023 7:13 AM  Leslie Salazar  has presented today for surgery, with the diagnosis of hx of breast cancer.  The various methods of treatment have been discussed with the patient and family. After consideration of risks, benefits and other options for treatment, the patient has consented to  Procedure(s): PLACEMENT OF BREAST IMPLANTS (Bilateral) CAPSULECTOMY (Bilateral) as a surgical intervention.  The patient's history has been reviewed, patient examined, no change in status, stable for surgery.  I have reviewed the patient's chart and labs.  Questions were answered to the patient's satisfaction.     Alena Bills Eldar Robitaille

## 2023-01-27 NOTE — Anesthesia Preprocedure Evaluation (Signed)
Anesthesia Evaluation  Patient identified by MRN, date of birth, ID band Patient awake    Reviewed: Allergy & Precautions, H&P , NPO status , Patient's Chart, lab work & pertinent test results  History of Anesthesia Complications (+) PONV and history of anesthetic complications  Airway Mallampati: II   Neck ROM: full    Dental   Pulmonary sleep apnea    breath sounds clear to auscultation       Cardiovascular hypertension,  Rhythm:regular Rate:Normal     Neuro/Psych  Headaches    GI/Hepatic   Endo/Other    Renal/GU      Musculoskeletal   Abdominal   Peds  Hematology   Anesthesia Other Findings   Reproductive/Obstetrics                             Anesthesia Physical Anesthesia Plan  ASA: 2  Anesthesia Plan: General   Post-op Pain Management:    Induction: Intravenous  PONV Risk Score and Plan: 4 or greater and Ondansetron, Dexamethasone, Midazolam, Scopolamine patch - Pre-op and Treatment may vary due to age or medical condition  Airway Management Planned: Oral ETT  Additional Equipment:   Intra-op Plan:   Post-operative Plan: Extubation in OR  Informed Consent: I have reviewed the patients History and Physical, chart, labs and discussed the procedure including the risks, benefits and alternatives for the proposed anesthesia with the patient or authorized representative who has indicated his/her understanding and acceptance.     Dental advisory given  Plan Discussed with: CRNA, Anesthesiologist and Surgeon  Anesthesia Plan Comments:        Anesthesia Quick Evaluation

## 2023-01-27 NOTE — Anesthesia Procedure Notes (Signed)
Procedure Name: LMA Insertion Date/Time: 01/27/2023 10:03 AM  Performed by: Ronnette Hila, CRNAPre-anesthesia Checklist: Patient identified, Emergency Drugs available, Suction available and Patient being monitored Patient Re-evaluated:Patient Re-evaluated prior to induction Oxygen Delivery Method: Circle System Utilized Preoxygenation: Pre-oxygenation with 100% oxygen Induction Type: IV induction Ventilation: Mask ventilation without difficulty LMA: LMA inserted LMA Size: 4.0 Number of attempts: 1 Airway Equipment and Method: bite block Placement Confirmation: positive ETCO2 Tube secured with: Tape Dental Injury: Teeth and Oropharynx as per pre-operative assessment

## 2023-01-27 NOTE — Discharge Instructions (Addendum)
INSTRUCTIONS FOR AFTER BREAST SURGERY   You will likely have some questions about what to expect following your operation.  The following information will help you and your family understand what to expect when you are discharged from the hospital.  It is important to follow these guidelines to help ensure a smooth recovery and reduce complication.  Postoperative instructions include information on: diet, wound care, medications and physical activity.  AFTER SURGERY Expect to go home after the procedure.  In some cases, you may need to spend one night in the hospital for observation.  DIET Breast surgery does not require a specific diet.  However, the healthier you eat the better your body will heal. It is important to increasing your protein intake.  This means limiting the foods with sugar and carbohydrates.  Focus on vegetables and some meat.  If you have liposuction during your procedure be sure to drink water.  If your urine is bright yellow, then it is concentrated, and you need to drink more water.  As a general rule after surgery, you should have 8 ounces of water every hour while awake.  If you find you are persistently nauseated or unable to take in liquids let us know.  NO TOBACCO USE or EXPOSURE.  This will slow your healing process and lead to a wound.  WOUND CARE Leave the binder on for 3 days . Use fragrance free soap like Dial, Dove or [place].   After 3 days you can remove the binder to shower. Once dry apply binder or sports bra. If you have liposuction you will have a soft and spongy dressing (Lipofoam) that helps prevent creases in your skin.  Remove before you shower and then replace it.  It is also available on [employer]. If you have steri-strips / tape directly attached to your skin leave them in place. It is OK to get these wet.   No baths, pools or hot tubs for four weeks. We close your incision to leave the smallest and best-looking scar. No ointment or creams on your incisions  for four weeks.  No Neosporin (Too many skin reactions).  A few weeks after surgery you can use Mederma and start massaging the scar. We ask you to wear your binder or sports bra for the first 6 weeks around the clock, including while sleeping. This provides added comfort and helps reduce the fluid accumulation at the surgery site. NO Ice or heating pads to the operative site.  You have a very high risk of a BURN before you feel the temperature change.  ACTIVITY No heavy lifting until cleared by the doctor.  This usually means no more than a half-gallon of milk.  It is OK to walk and climb stairs. Moving your legs is very important to decrease your risk of a blood clot.  It will also help keep you from getting deconditioned.  Every 1 to 2 hours get up and walk for 5 minutes. This will help with a quicker recovery back to normal.  Let pain be your guide so you don't do too much.  This time is for you to recover.  You will be more comfortable if you sleep and rest with your head elevated either with a few pillows under you or in a recliner.  No stomach sleeping for a three months.  WORK Everyone returns to work at different times. As a rough guide, most people take at least 1 - 2 weeks off prior to returning to work. If  you need documentation for your job, give the forms to the front staff at the clinic.  DRIVING Arrange for someone to bring you home from the hospital after your surgery.  You may be able to drive a few days after surgery but not while taking any narcotics or valium.  BOWEL MOVEMENTS Constipation can occur after anesthesia and while taking pain medication.  It is important to stay ahead for your comfort.  We recommend taking Milk of Magnesia (2 tablespoons; twice a day) while taking the pain pills.  MEDICATIONS You may be prescribed should start after surgery At your preoperative visit for you history and physical you may have been given the following medications: An antibiotic: Start  this medication when you get home and take according to the instructions on the bottle. Zofran 4 mg:  This is to treat nausea and vomiting.  You can take this every 6 hours as needed and only if needed. Valium 2 mg for breast cancer patients: This is for muscle tightness if you have an implant or expander. This will help relax your muscle which also helps with pain control.  This can be taken every 12 hours as needed. Don't drive after taking this medication. Norco (hydrocodone/acetaminophen) 5/325 mg:  This is only to be used after you have taken the Motrin or the Tylenol. Every 8 hours as needed.   Over the counter Medication to take: Ibuprofen (Motrin) 600 mg:  Take this every 6 hours.  If you have additional pain then take 500 mg of the Tylenol every 8 hours.  Only take the Norco after you have tried these two. MiraLAX or Milk of Magnesia: Take this according to the bottle if you take the Norco.  WHEN TO CALL Call your surgeon's office if any of the following occur: Fever 101 degrees F or greater Excessive bleeding or fluid from the incision site. Pain that increases over time without aid from the medications Redness, warmth, or pus draining from incision sites Persistent nausea or inability to take in liquids Severe misshapen area that underwent the operation.   Post Anesthesia Home Care Instructions  Activity: Get plenty of rest for the remainder of the day. A responsible individual must stay with you for 24 hours following the procedure.  For the next 24 hours, DO NOT: -Drive a car -Advertising copywriter -Drink alcoholic beverages -Take any medication unless instructed by your physician -Make any legal decisions or sign important papers.  Meals: Start with liquid foods such as gelatin or soup. Progress to regular foods as tolerated. Avoid greasy, spicy, heavy foods. If nausea and/or vomiting occur, drink only clear liquids until the nausea and/or vomiting subsides. Call your  physician if vomiting continues.  Special Instructions/Symptoms: Your throat may feel dry or sore from the anesthesia or the breathing tube placed in your throat during surgery. If this causes discomfort, gargle with warm salt water. The discomfort should disappear within 24 hours.  If you had a scopolamine patch placed behind your ear for the management of post- operative nausea and/or vomiting:  1. The medication in the patch is effective for 72 hours, after which it should be removed.  Wrap patch in a tissue and discard in the trash. Wash hands thoroughly with soap and water. 2. You may remove the patch earlier than 72 hours if you experience unpleasant side effects which may include dry mouth, dizziness or visual disturbances. 3. Avoid touching the patch. Wash your hands with soap and water after contact with the  patch.    May take Tylenol as needed for pain and then every 8 hours.

## 2023-01-28 ENCOUNTER — Ambulatory Visit (INDEPENDENT_AMBULATORY_CARE_PROVIDER_SITE_OTHER): Payer: Federal, State, Local not specified - PPO | Admitting: Student

## 2023-01-28 ENCOUNTER — Encounter (HOSPITAL_BASED_OUTPATIENT_CLINIC_OR_DEPARTMENT_OTHER): Payer: Self-pay | Admitting: Plastic Surgery

## 2023-01-28 DIAGNOSIS — N651 Disproportion of reconstructed breast: Secondary | ICD-10-CM

## 2023-01-28 NOTE — Anesthesia Postprocedure Evaluation (Signed)
Anesthesia Post Note  Patient: ZOEYLYNN CELESTINE  Procedure(s) Performed: PLACEMENT OF BREAST IMPLANTS (Bilateral: Breast) [id number] (Bilateral: Chest)     Patient location during evaluation: PACU Anesthesia Type: General Level of consciousness: awake and alert Pain management: pain level controlled Vital Signs Assessment: post-procedure vital signs reviewed and stable Respiratory status: spontaneous breathing, nonlabored ventilation, respiratory function stable and patient connected to nasal cannula oxygen Cardiovascular status: blood pressure returned to baseline and stable Postop Assessment: no apparent nausea or vomiting Anesthetic complications: no   No notable events documented.  Last Vitals:  Vitals:   01/27/23 1200 01/27/23 1228  BP:  (!) 121/94  Pulse: 76 72  Resp: 19 16  Temp:  (!) 36.1 C  SpO2: 99% 100%    Last Pain:  Vitals:   01/28/23 1003  TempSrc:   PainSc: 6                  Trin[place] Ingle S

## 2023-01-28 NOTE — Progress Notes (Signed)
Patient is a 57 year old female with history of right breast cancer.  She underwent bilateral exchange of implant and right breast capsulectomy for implant repositioning with Dr. Ulice Bold yesterday, 01/27/2023.  Intraoperatively, patient had a 375 cc Mentor smooth round high-profile gel implant placed on the left and a 350 cc Mentor smooth round high-profile gel implant placed on the right.  Today, patient reports she is doing well.  She states she is feeling better from yesterday.  She states that she is still having little bit of soreness on the right side.  She states that she took 2 oxycodones yesterday, but has not needed to take oxycodone today.  She states she is taken Tylenol and her pain has been controlled.  Patient denies any nausea or vomiting, states she still has the scopolamine patch on.  Patient denies any issues with eating and drinking.  She states that she has not had a bowel movement yet.  Discussed with patient that she may start MiraLAX if needed for constipation.  Discussed with patient to continue to take Tylenol and ibuprofen for pain and to continue compression at all times.  Patient to follow-up at her next scheduled appointment.  Instructed her to call her clinic if she has any questions or concerns about anything.

## 2023-01-31 NOTE — Plan of Care (Signed)
[facility] Tonsillectomy/Adenoidectomy, Postoperative PEDS care plan entered in error.

## 2023-02-03 ENCOUNTER — Encounter: Payer: Self-pay | Admitting: Plastic Surgery

## 2023-02-04 ENCOUNTER — Encounter: Payer: Federal, State, Local not specified - PPO | Admitting: Plastic Surgery

## 2023-02-04 NOTE — Telephone Encounter (Signed)
I called the patient in regards to her MyChart message.  I discussed with her that she can leave the dressings on until she is seen at her next appointment.  Patient also states that she has been having some pain/tightness to her right lateral breast/arm.  Discussed with her that she should continue taking Tylenol and ibuprofen for pain, but she should continue gentle range of motion of the right upper extremity.  Patient expressed understanding.  Discussed with patient to call us if she feels her pain is worsening or she has any concerning symptoms.  Patient states she has an appointment on Tuesday, we will plan to see the patient at that time.

## 2023-02-08 ENCOUNTER — Ambulatory Visit (INDEPENDENT_AMBULATORY_CARE_PROVIDER_SITE_OTHER): Payer: Federal, State, Local not specified - PPO | Admitting: Plastic Surgery

## 2023-02-08 ENCOUNTER — Encounter: Payer: Self-pay | Admitting: Plastic Surgery

## 2023-02-08 VITALS — BP 136/86 | HR 68

## 2023-02-08 DIAGNOSIS — Z9013 Acquired absence of bilateral breasts and nipples: Secondary | ICD-10-CM

## 2023-02-08 DIAGNOSIS — Z853 Personal history of malignant neoplasm of breast: Secondary | ICD-10-CM

## 2023-02-08 NOTE — Progress Notes (Signed)
Subjective:    Patient ID: Leslie Salazar, female    DOB: 07-22-1965, 57 y.o.   MRN: [id number]  The patient is here for follow-up after having her expanders removed and implants placed on October 17.  The patient did not like the different size so she requested slight change.  We were able to do that.  She has a left Mentor smooth round high-profile gel 375 cc implant and a right Mentor smooth round high-profile gel 350 cc implant.  There is no sign of a hematoma or seroma.  The skin is healing very nicely.  The patient has better shape with the capsule repositioning as well.      Review of Systems  Constitutional: Negative.   HENT: Negative.    Eyes: Negative.   Respiratory: Negative.    Cardiovascular: Negative.   Gastrointestinal: Negative.   Endocrine: Negative.   Genitourinary: Negative.   Musculoskeletal: Negative.        Objective:   Physical Exam Vitals reviewed.  Constitutional:      Appearance: Normal appearance.  Cardiovascular:     Rate and Rhythm: Normal rate.     Pulses: Normal pulses.  Pulmonary:     Effort: Pulmonary effort is normal.  Skin:    General: Skin is warm.     Capillary Refill: Capillary refill takes less than 2 seconds.  Neurological:     Mental Status: She is alert and oriented to person, place, and time.  Psychiatric:        Mood and Affect: Mood normal.        Behavior: Behavior normal.        Thought Content: Thought content normal.        Judgment: Judgment normal.       Assessment & Plan:     ICD-10-CM   1. History of breast cancer in female  Z57.3     2. 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  Leave the Steri-Strips alone.  Follow-up in 2 weeks.  Pictures were obtained of the patient and placed in the chart with the patient's or guardian's permission.

## 2023-02-14 NOTE — Progress Notes (Unsigned)
Patient is a pleasant 57 year old female with PMH of right-sided breast cancer with ipsilateral radiation and bilateral mastectomy and implant-based reconstruction now s/p right breast capsulectomy for implant repositioning and bilateral exchange of implants.  375 cc implant placed in the left, 350 cc implant placed on the right.  She was last seen here in clinic on 02/08/2023.  At that time, exam was entirely reassuring.  Recommended continued compressive bra and activity modifications.  Follow-up in 2 weeks.  Today,

## 2023-02-15 ENCOUNTER — Other Ambulatory Visit: Payer: Self-pay | Admitting: Internal Medicine

## 2023-02-15 ENCOUNTER — Ambulatory Visit (INDEPENDENT_AMBULATORY_CARE_PROVIDER_SITE_OTHER): Payer: Federal, State, Local not specified - PPO | Admitting: Physician Assistant

## 2023-02-15 VITALS — BP 125/87 | HR 67

## 2023-02-15 DIAGNOSIS — Z9013 Acquired absence of bilateral breasts and nipples: Secondary | ICD-10-CM

## 2023-02-15 MED ORDER — VALACYCLOVIR HCL 500 MG PO TABS: ORAL_TABLET | ORAL | 1 refills | Status: DC

## 2023-02-25 DIAGNOSIS — H43393 Other vitreous opacities, bilateral: Secondary | ICD-10-CM | POA: Diagnosis not present

## 2023-02-26 DIAGNOSIS — G4733 Obstructive sleep apnea (adult) (pediatric): Secondary | ICD-10-CM | POA: Diagnosis not present

## 2023-03-01 ENCOUNTER — Encounter: Payer: Federal, State, Local not specified - PPO | Admitting: Physician Assistant

## 2023-03-08 ENCOUNTER — Encounter: Payer: Self-pay | Admitting: Physician Assistant

## 2023-03-08 ENCOUNTER — Ambulatory Visit: Payer: Federal, State, Local not specified - PPO | Admitting: Physician Assistant

## 2023-03-08 VITALS — BP 135/90 | HR 55

## 2023-03-08 DIAGNOSIS — Z9013 Acquired absence of bilateral breasts and nipples: Secondary | ICD-10-CM

## 2023-03-08 NOTE — Progress Notes (Signed)
Patient is a pleasant 57 year old female with PMH of right-sided breast cancer with ipsilateral radiation and bilateral mastectomy and implant-based reconstruction now s/p right breast capsulectomy for implant repositioning and bilateral exchange of implants performed 01/27/2023 by Dr. Ulice Bold.  375 cc implant placed in the left, 350 cc implant placed on the right.   She was last seen here in clinic on 02/15/2023.  At that time, she was quite pleased with repositioning of her right implant and overall from her surgery.  She did endorse some itching near where the Steri-Strips had been placed and they were removed without complication.  Recommending Vaseline x 1 week to help with residual Dermabond and then transition to silicone scar gels twice daily x 3 months.  Her exam was entirely benign.  Suspect that the itching was related to tape dermatitis.  Follow-up in 2 weeks.  Today, patient is doing well.  She states that she is overall quite pleased with the capsulectomy and implant repositioning along with implant exchange.  She feels as though she has improved shape and symmetry.  However, at the site of her right inframammary incision, she is having a "tugging" discomfort that is pulling inward.  She does not have the same issue on the other side.  She continues to deny any leg swelling, chest pain, difficulty breathing, fevers, or other concerns.  She is excited to increase activity.  On exam, reconstructed breasts have excellent shape and symmetry.  Her right inframammary incision through which the capsulectomy and implant exchange was performed does appear to have scarred down.  Difficult to appreciate in the images.  Area is mildly tender.  However, the remainder of the exam is entirely benign.  Incisions are well-healed.  No surrounding skin changes.  At this time, cleared from a postoperative standpoint.  She can increase activity as tolerated and no longer needs to wear compressive garments.   Recommending silicone scar gels over the incisions twice daily x 3 months.  Will have her follow-up with Dr. Ulice Bold in 6 months in the event that the scarring is still causing her some pulling discomfort.  She may be able to perform a scar release.  However, it may also improve in the interim with time and mechanical massage.  If that occurs and she remains pleased and without any concerns she can certainly cancel her appointment.  Picture(s) obtained of the patient and placed in the chart were with the patient's or guardian's permission.

## 2023-03-22 ENCOUNTER — Encounter: Payer: Self-pay | Admitting: Dermatology

## 2023-03-22 ENCOUNTER — Ambulatory Visit (INDEPENDENT_AMBULATORY_CARE_PROVIDER_SITE_OTHER): Payer: Federal, State, Local not specified - PPO | Admitting: Dermatology

## 2023-03-22 DIAGNOSIS — L910 Hypertrophic scar: Secondary | ICD-10-CM | POA: Diagnosis not present

## 2023-03-22 MED ORDER — TRIAMCINOLONE ACETONIDE 10 MG/ML IJ SUSP
10.0000 mg | Freq: Once | INTRAMUSCULAR | Status: AC
Start: 2023-03-22 — End: 2023-03-22
  Administered 2023-03-22: 10 mg

## 2023-03-22 NOTE — Progress Notes (Signed)
   New Patient Visit   Subjective  Leslie Salazar is a 57 y.o. female who presents for the following: New Pt - face lesion Patient states she has lesion located at the face that she would like to have examined. Patient reports the areas have been there for 1 year. She reports the areas are not bothersome.Patient rates irritation 0 out of 10. She states that the areas have not spread. Patient reports she has previously been treated for these areas by PCP but no topicals Rx only a referral. Patient denies Hx of bx. Patient denies family history of skin cancer(s).  The patient has spots, moles and lesions to be evaluated, some may be new or changing and the patient may have concern these could be cancer.   The following portions of the chart were reviewed this encounter and updated as appropriate: medications, allergies, medical history  Review of Systems:  No other skin or systemic complaints except as noted in HPI or Assessment and Plan.  Objective  Well appearing patient in no apparent distress; mood and affect are within normal limits.    A focused examination was performed of the following areas: face   Relevant exam findings are noted in the Assessment and Plan.          Assessment & Plan   Picker's Nodule (Keloid) - Assessment: Persistent spot present for over a year, fluctuating in appearance, initially thought to be a trapped hair leading to nodule formation. Identified as a picker's nodule, resembling a keloid, resistant to home treatment with Mederma, with a history of recurrence after periods of improvement.   - Treatment Plan:  Inject lesion with Kenalog 10 (corticosteroid), 0.01cc. Educate patient on potential side effects, including a slight chance of indentation (temporary) and possible lightening of surrounding skin. Advise patient to expect gradual improvement over 8 weeks. Schedule follow-up appointment in 2 months. Instruct patient to avoid picking or squeezing  the area, especially for the next 24 hours. Continue current facial wash regimen. Contact clinic if any changes or issues arise.  Procedure Note  Location: left jawline  Informed Consent: Discussed risks (infection, pain, bleeding, bruising, thinning of the skin, loss of skin pigment, lack of resolution, and recurrence of lesion) and benefits of the procedure, as well as the alternatives. Informed consent was obtained. Preparation: The area was prepared a standard fashion.  Anesthesia: n/a  Procedure Details: An intralesional injection was performed with Kenalog 10 mg/cc. 0.01 cc in total were injected.  Total number of injections: 1  Plan: The patient was instructed on post-op care. Recommend OTC analgesia as needed for pain.   Keloid  Related Medications triamcinolone acetonide (KENALOG) 10 MG/ML injection 10 mg     Return in about 8 weeks (around 05/17/2023) for keloid - pickers nodule.    Documentation: I have reviewed the above documentation for accuracy and completeness, and I agree with the above.   I, Shirron Marcha Solders, CMA, am acting as scribe for [employer], DO.   Langston Reusing, DO

## 2023-03-22 NOTE — Patient Instructions (Addendum)
Hello Leslie Salazar,  Thank you for visiting our dermatology clinic today. Your dedication to addressing your skin condition is commendable, and I am here to support your journey towards improved skin health. Here is a summary of the key instructions from today's consultation:  - Treatment Plan: Your Picker's Nodule/Keloid will be treated with an injection of Kenalog (corticosteroid), specifically Kenalog 10 at 0.01cc. This approach is designed to shrink, flatten, and lighten the nodule.  - Post-Treatment Care: It's normal to experience a bit of discomfort during the injection, which will be mitigated with local anesthesia. To ensure optimal healing, it's crucial not to pick at the treated area, as this can lead to thickening and darkening of the skin.  - Expected Results: You should see improvement on a weekly basis, with the full effects anticipated around eight weeks. Depending on how the nodule responds, we may consider additional treatments if it does not become completely flat.  - Follow-Up Appointment: We will schedule a follow-up in two months to evaluate the progress and decide if further treatment is necessary.  - Skin Care: Please continue with your current facial wash, as it is effectively maintaining your complexion.  Should you have any concerns or notice any issues before your next appointment, please do not hesitate to reach out to our office.  Warm regards,  Dr. Langston Reusing,  Dermatologist    Important Information  Due to recent changes in healthcare laws, you may see results of your pathology and/or laboratory studies on MyChart before the doctors have had a chance to review them. We understand that in some cases there may be results that are confusing or concerning to you. Please understand that not all results are received at the same time and often the doctors may need to interpret multiple results in order to provide you with the best plan of care or course of treatment.  Therefore, we ask that you please give [place] 2 business days to thoroughly review all your results before contacting the office for clarification. Should we see a critical lab result, you will be contacted sooner.   If You Need Anything After Your Visit  If you have any questions or concerns for your doctor, please call our main line at [phone] If no one answers, please leave a voicemail as directed and we will return your call as soon as possible. Messages left after 4 pm will be answered the following business day.   You may also send [place] a message via MyChart. We typically respond to MyChart messages within 1-2 business days.  For prescription refills, please ask your pharmacy to contact our office. Our fax number is [phone].  If you have an urgent issue when the clinic is closed that cannot wait until the next business day, you can page your doctor at the number below.    Please note that while we do our best to be available for urgent issues outside of office hours, we are not available 24/7.   If you have an urgent issue and are unable to reach [place], you may choose to seek medical care at your doctor's office, retail clinic, urgent care center, or emergency room.  If you have a medical emergency, please immediately call 911 or go to the emergency department. In the event of inclement weather, please call our main line at [phone] for an update on the status of any delays or closures.  Dermatology Medication Tips: Please keep the boxes that topical medications come in in order to  help keep track of the instructions about where and how to use these. Pharmacies typically print the medication instructions only on the boxes and not directly on the medication tubes.   If your medication is too expensive, please contact our office at [phone] or send [place] a message through MyChart.   We are unable to tell what your co-pay for medications will be in advance as this is different  depending on your insurance coverage. However, we may be able to find a substitute medication at lower cost or fill out paperwork to get insurance to cover a needed medication.   If a prior authorization is required to get your medication covered by your insurance company, please allow [place] 1-2 business days to complete this process.  Drug prices often vary depending on where the prescription is filled and some pharmacies may offer cheaper prices.  The website www.goodrx.com contains coupons for medications through different pharmacies. The prices here do not account for what the cost may be with help from insurance (it may be cheaper with your insurance), but the website can give you the price if you did not use any insurance.  - You can print the associated coupon and take it with your prescription to the pharmacy.  - You may also stop by our office during regular business hours and pick up a GoodRx coupon card.  - If you need your prescription sent electronically to a different pharmacy, notify our office through [facility] or by phone at [phone]

## 2023-03-28 DIAGNOSIS — G4733 Obstructive sleep apnea (adult) (pediatric): Secondary | ICD-10-CM | POA: Diagnosis not present

## 2023-04-02 DIAGNOSIS — G4733 Obstructive sleep apnea (adult) (pediatric): Secondary | ICD-10-CM | POA: Diagnosis not present

## 2023-04-08 ENCOUNTER — Other Ambulatory Visit: Payer: Self-pay | Admitting: Internal Medicine

## 2023-04-14 ENCOUNTER — Encounter: Payer: Self-pay | Admitting: Internal Medicine

## 2023-04-14 ENCOUNTER — Ambulatory Visit (INDEPENDENT_AMBULATORY_CARE_PROVIDER_SITE_OTHER): Payer: Federal, State, Local not specified - PPO | Admitting: Internal Medicine

## 2023-04-14 VITALS — BP 110/70 | HR 69 | Temp 98.7°F | Ht 62.0 in | Wt 161.8 lb

## 2023-04-14 DIAGNOSIS — I10 Essential (primary) hypertension: Secondary | ICD-10-CM | POA: Diagnosis not present

## 2023-04-14 DIAGNOSIS — Z6829 Body mass index (BMI) 29.0-29.9, adult: Secondary | ICD-10-CM | POA: Diagnosis not present

## 2023-04-14 DIAGNOSIS — Z23 Encounter for immunization: Secondary | ICD-10-CM | POA: Diagnosis not present

## 2023-04-14 DIAGNOSIS — Z8249 Family history of ischemic heart disease and other diseases of the circulatory system: Secondary | ICD-10-CM

## 2023-04-14 DIAGNOSIS — E663 Overweight: Secondary | ICD-10-CM | POA: Diagnosis not present

## 2023-04-14 DIAGNOSIS — E7800 Pure hypercholesterolemia, unspecified: Secondary | ICD-10-CM

## 2023-04-14 DIAGNOSIS — C50911 Malignant neoplasm of unspecified site of right female breast: Secondary | ICD-10-CM | POA: Insufficient documentation

## 2023-04-14 DIAGNOSIS — Z853 Personal history of malignant neoplasm of breast: Secondary | ICD-10-CM

## 2023-04-14 NOTE — Assessment & Plan Note (Signed)
She is encouraged to aim for at least 150 minutes of exercise/week.

## 2023-04-14 NOTE — Assessment & Plan Note (Signed)
 She is s/p b/l mastectomy and reconstructive surgery w/ bilateral silicone implants. She is in agreement with treatment plan.

## 2023-04-14 NOTE — Assessment & Plan Note (Signed)
 Chronic, well controlled. She will continue with hydrochlorothiazide 12.5mg  daily and metoprolol succinate XL 25mg  daily. No med changes today. She will rto in six months for re-evaluation.

## 2023-04-14 NOTE — Progress Notes (Signed)
 I,Victoria T Emmitt, CMA,acting as a neurosurgeon for Catheryn LOISE Slocumb, MD.,have documented all relevant documentation on the behalf of Catheryn LOISE Slocumb, MD,as directed by  Catheryn LOISE Slocumb, MD while in the presence of Catheryn LOISE Slocumb, MD.  Subjective:  Patient ID: Leslie Salazar , female    DOB: 07/08/1965 , 58 y.o.   MRN: [id number]  Chief Complaint  Patient presents with   Hypertension    HPI  She presents today for BP check. Reports compliance with meds. She is not having any cp, sob or palpitations. She is still exercising regularly with a systems analyst.   Hypertension This is a chronic problem. The current episode started more than 1 year ago. The problem has been rapidly improving since onset. The problem is controlled. Pertinent negatives include no blurred vision or orthopnea. Risk factors for coronary artery disease include post-menopausal state. Past treatments include diuretics and beta blockers. The current treatment provides moderate improvement. There are no compliance problems.      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, Pos PR   Fibroids    Heart palpitations    High cholesterol    History of angina    HSV-1 (herpes simplex virus 1) infection    Hx of radiation therapy 2013   from april to may - six weeks   Hypertension    Left ovarian cyst    PONV (postoperative nausea and vomiting)    nausea no vomiting   Scoliosis    Sleep apnea    c- pap nightly   Urinary urgency    Wears glasses      Family History  Problem Relation Age of Onset   Diabetes Mother        HAS HAD A KIDNEY TRANSPLATN   Hypertension Mother    Kidney failure Mother    Hypertension Father    Gallstones Father    Hypertension Sister    Gallstones Sister    Hypertension Sister    Colon cancer Paternal Aunt     Lung cancer Maternal Grandfather      Current Outpatient Medications:    aspirin  EC 81 MG tablet, Take 81 mg by mouth every morning., Disp: , Rfl:    atorvastatin  (LIPITOR) 20 MG tablet, TAKE 1 TABLET(20 MG) BY MOUTH DAILY, Disp: 90 tablet, Rfl: 2   Coenzyme Q10 100 MG capsule, Take 1 capsule by mouth daily., Disp: , Rfl:    hydrochlorothiazide  (MICROZIDE ) 12.5 MG capsule, TAKE 1 CAPSULE(12.5 MG) BY MOUTH DAILY, Disp: 90 capsule, Rfl: 1   hydrocortisone  (ANUSOL -HC) 2.5 % rectal cream, Place 1 application rectally 2 (two) times daily., Disp: 30 g, Rfl: 3   loratadine (CLARITIN) 10 MG tablet, Take 10 mg by mouth every morning. , Disp: , Rfl:    metoprolol  succinate (TOPROL -XL) 25 MG 24 hr tablet, TAKE 1 TABLET(25 MG) BY MOUTH DAILY, Disp: 90 tablet, Rfl: 2   montelukast  (SINGULAIR ) 10 MG tablet, TAKE 1 TABLET(10 MG) BY MOUTH DAILY, Disp: 90 tablet, Rfl: 2   Multiple Vitamins-Minerals (MULTIVITAMIN WOMENS 50+ ADV PO), Take by mouth., Disp: , Rfl:    Omega-3 Fatty Acids (FISH OIL PO), Take by mouth. 1 capsule daily, Disp: , Rfl:    ondansetron  (ZOFRAN -ODT) 4 MG disintegrating  tablet, Take 1 tablet (4 mg total) by mouth every 8 (eight) hours as needed for nausea or vomiting., Disp: 20 tablet, Rfl: 0   traZODone  (DESYREL ) 50 MG tablet, TAKE 1 TABLET(50 MG) BY MOUTH AT BEDTIME AS NEEDED FOR SLEEP, Disp: 90 tablet, Rfl: 1   valACYclovir  (VALTREX ) 500 MG tablet, One tab po dailyprn, Disp: 90 tablet, Rfl: 1   No Known Allergies   Review of Systems  Constitutional: Negative.   Eyes:  Negative for blurred vision.  Respiratory: Negative.    Cardiovascular: Negative.  Negative for orthopnea.  Gastrointestinal: Negative.   Neurological: Negative.   Psychiatric/Behavioral: Negative.       Today's Vitals   04/14/23 0936  BP: 110/70  Pulse: 69  Temp: 98.7 F (37.1 C)  SpO2: 98%  Weight: 161 lb 12.8 oz (73.4 kg)  Height: 5' 2 (1.575 m)   Body mass index is 29.59 kg/m.  Wt Readings from  Last 3 Encounters:  04/14/23 161 lb 12.8 oz (73.4 kg)  01/27/23 158 lb 11.7 oz (72 kg)  01/20/23 158 lb (71.7 kg)     Objective:  Physical Exam Vitals and nursing note reviewed.  Constitutional:      Appearance: Normal appearance.  HENT:     Head: Normocephalic and atraumatic.  Eyes:     Extraocular Movements: Extraocular movements intact.  Cardiovascular:     Rate and Rhythm: Normal rate and regular rhythm.     Heart sounds: Normal heart sounds.  Pulmonary:     Effort: Pulmonary effort is normal.     Breath sounds: Normal breath sounds.  Musculoskeletal:     Cervical back: Normal range of motion.  Skin:    General: Skin is warm.  Neurological:     General: No focal deficit present.     Mental Status: She is alert.  Psychiatric:        Mood and Affect: Mood normal.        Behavior: Behavior normal.         Assessment And Plan:  Essential hypertension, benign Assessment & Plan: Chronic, well controlled. She will continue with hydrochlorothiazide  12.5mg  daily and metoprolol  succinate XL 25mg  daily. No med changes today. She will rto in six months for re-evaluation.   Orders: -     CMP14+EGFR -     Lipid panel  Pure hypercholesterolemia Assessment & Plan: Chronic, she will continue with atorvastatin  20mg  daily. We discussed use of cardiac calcium  scoring for risk stratification. She is aware of $99 fee.   Orders: -     Lipoprotein A (LPA) -     CT CARDIAC SCORING (SELF PAY ONLY); Future  Overweight with body mass index (BMI) of 29 to 29.9 in adult Assessment & Plan: She is encouraged to aim for at least 150 minutes of exercise/week.    History of breast cancer in female Assessment & Plan: She is s/p b/l mastectomy and reconstructive surgery w/ bilateral silicone implants. She is in agreement with treatment plan.    Family history of heart disease -     Lipoprotein A (LPA) -     CT CARDIAC SCORING (SELF PAY ONLY); Future  Immunization due -     Pfizer  Comirnaty Covid-19 Vaccine 85yrs & older     Return if symptoms worsen or fail to improve.  Patient was given opportunity to ask questions. Patient verbalized understanding of the plan and was able to repeat key elements of the plan. All questions were answered to their satisfaction.  I, Catheryn LOISE Slocumb, MD, have reviewed all documentation for this visit. The documentation on 04/14/23 for the exam, diagnosis, procedures, and orders are all accurate and complete.   IF YOU HAVE BEEN REFERRED TO A SPECIALIST, IT MAY TAKE 1-2 WEEKS TO SCHEDULE/PROCESS THE REFERRAL. IF YOU HAVE NOT HEARD FROM US /SPECIALIST IN TWO WEEKS, PLEASE GIVE US  A CALL AT [phone].   THE PATIENT IS ENCOURAGED TO PRACTICE SOCIAL DISTANCING DUE TO THE COVID-19 PANDEMIC.

## 2023-04-14 NOTE — Patient Instructions (Signed)
 Hypertension, Adult Hypertension is another name for high blood pressure. High blood pressure forces your heart to work harder to pump blood. This can cause problems over time. There are two numbers in a blood pressure reading. There is a top number (systolic) over a bottom number (diastolic). It is best to have a blood pressure that is below 120/80. What are the causes? The cause of this condition is not known. Some other conditions can lead to high blood pressure. What increases the risk? Some lifestyle factors can make you more likely to develop high blood pressure: Smoking. Not getting enough exercise or physical activity. Being overweight. Having too much fat, sugar, calories, or salt (sodium) in your diet. Drinking too much alcohol. Other risk factors include: Having any of these conditions: Heart disease. Diabetes. High cholesterol. Kidney disease. Obstructive sleep apnea. Having a family history of high blood pressure and high cholesterol. Age. The risk increases with age. Stress. What are the signs or symptoms? High blood pressure may not cause symptoms. Very high blood pressure (hypertensive crisis) may cause: Headache. Fast or uneven heartbeats (palpitations). Shortness of breath. Nosebleed. Vomiting or feeling like you may vomit (nauseous). Changes in how you see. Very bad chest pain. Feeling dizzy. Seizures. How is this treated? This condition is treated by making healthy lifestyle changes, such as: Eating healthy foods. Exercising more. Drinking less alcohol. Your doctor may prescribe medicine if lifestyle changes do not help enough and if: Your top number is above 130. Your bottom number is above 80. Your personal target blood pressure may vary. Follow these instructions at home: Eating and drinking  If told, follow the DASH eating plan. To follow this plan: Fill one half of your plate at each meal with fruits and vegetables. Fill one fourth of your plate  at each meal with whole grains. Whole grains include whole-wheat pasta, brown rice, and whole-grain bread. Eat or drink low-fat dairy products, such as skim milk or low-fat yogurt. Fill one fourth of your plate at each meal with low-fat (lean) proteins. Low-fat proteins include fish, chicken without skin, eggs, beans, and tofu. Avoid fatty meat, cured and processed meat, or chicken with skin. Avoid pre-made or processed food. Limit the amount of salt in your diet to less than 1,500 mg each day. Do not drink alcohol if: Your doctor tells you not to drink. You are pregnant, may be pregnant, or are planning to become pregnant. If you drink alcohol: Limit how much you have to: 0-1 drink a day for women. 0-2 drinks a day for men. Know how much alcohol is in your drink. In the [place] one drink equals one 12 oz bottle of beer (355 mL), one 5 oz glass of wine (148 mL), or one 1 oz glass of hard liquor (44 mL). Lifestyle  Work with your doctor to stay at a healthy weight or to lose weight. Ask your doctor what the best weight is for you. Get at least 30 minutes of exercise that causes your heart to beat faster (aerobic exercise) most days of the week. This may include walking, swimming, or biking. Get at least 30 minutes of exercise that strengthens your muscles (resistance exercise) at least 3 days a week. This may include lifting weights or doing Pilates. Do not smoke or use any products that contain nicotine or tobacco. If you need help quitting, ask your doctor. Check your blood pressure at home as told by your doctor. Keep all follow-up visits. Medicines Take over-the-counter and prescription medicines  only as told by your doctor. Follow directions carefully. Do not skip doses of blood pressure medicine. The medicine does not work as well if you skip doses. Skipping doses also puts you at risk for problems. Ask your doctor about side effects or reactions to medicines that you should watch  for. Contact a doctor if: You think you are having a reaction to the medicine you are taking. You have headaches that keep coming back. You feel dizzy. You have swelling in your ankles. You have trouble with your vision. Get help right away if: You get a very bad headache. You start to feel mixed up (confused). You feel weak or numb. You feel faint. You have very bad pain in your: Chest. Belly (abdomen). You vomit more than once. You have trouble breathing. These symptoms may be an emergency. Get help right away. Call 911. Do not wait to see if the symptoms will go away. Do not drive yourself to the hospital. Summary Hypertension is another name for high blood pressure. High blood pressure forces your heart to work harder to pump blood. For most people, a normal blood pressure is less than 120/80. Making healthy choices can help lower blood pressure. If your blood pressure does not get lower with healthy choices, you may need to take medicine. This information is not intended to replace advice given to you by your health care provider. Make sure you discuss any questions you have with your health care provider. Document Revised: 01/15/2021 Document Reviewed: 01/15/2021 Elsevier Patient Education  2024 [employer].

## 2023-04-14 NOTE — Assessment & Plan Note (Signed)
 Chronic, she will continue with atorvastatin 20mg  daily. We discussed use of cardiac calcium scoring for risk stratification. She is aware of $99 fee.

## 2023-04-15 LAB — LIPID PANEL
Chol/HDL Ratio: 2.4 {ratio} (ref 0.0–4.4)
Cholesterol, Total: 151 mg/dL (ref 100–199)
HDL: 62 mg/dL (ref >39)
LDL Chol Calc (NIH): 73 mg/dL (ref 0–99)
Triglycerides: 82 mg/dL (ref 0–149)
VLDL Cholesterol Cal: 16 mg/dL (ref 5–40)

## 2023-04-15 LAB — CMP14+EGFR
ALT: 26 [IU]/L (ref 0–32)
AST: 18 [IU]/L (ref 0–40)
Albumin: 4.7 g/dL (ref 3.8–4.9)
Alkaline Phosphatase: 86 [IU]/L (ref 44–121)
BUN/Creatinine Ratio: 18 (ref 9–23)
BUN: 14 mg/dL (ref 6–24)
Bilirubin Total: 1 mg/dL (ref 0.0–1.2)
CO2: 25 mmol/L (ref 20–29)
Calcium: 10.4 mg/dL — ABNORMAL HIGH (ref 8.7–10.2)
Chloride: 102 mmol/L (ref 96–106)
Creatinine, Ser: 0.77 mg/dL (ref 0.57–1.00)
Globulin, Total: 2.1 g/dL (ref 1.5–4.5)
Glucose: 74 mg/dL (ref 70–99)
Potassium: 4.5 mmol/L (ref 3.5–5.2)
Sodium: 143 mmol/L (ref 134–144)
Total Protein: 6.8 g/dL (ref 6.0–8.5)
eGFR: 90 mL/min/{1.73_m2} (ref >59)

## 2023-04-15 LAB — LIPOPROTEIN A (LPA): Lipoprotein (a): 184.4 nmol/L — ABNORMAL HIGH (ref <75.0)

## 2023-04-19 ENCOUNTER — Ambulatory Visit (INDEPENDENT_AMBULATORY_CARE_PROVIDER_SITE_OTHER): Payer: Self-pay

## 2023-04-19 DIAGNOSIS — Z8249 Family history of ischemic heart disease and other diseases of the circulatory system: Secondary | ICD-10-CM

## 2023-04-19 DIAGNOSIS — E7800 Pure hypercholesterolemia, unspecified: Secondary | ICD-10-CM

## 2023-04-28 DIAGNOSIS — G4733 Obstructive sleep apnea (adult) (pediatric): Secondary | ICD-10-CM | POA: Diagnosis not present

## 2023-05-06 DIAGNOSIS — C50111 Malignant neoplasm of central portion of right female breast: Secondary | ICD-10-CM | POA: Diagnosis not present

## 2023-05-06 DIAGNOSIS — Z9189 Other specified personal risk factors, not elsewhere classified: Secondary | ICD-10-CM | POA: Diagnosis not present

## 2023-05-06 DIAGNOSIS — I10 Essential (primary) hypertension: Secondary | ICD-10-CM | POA: Diagnosis not present

## 2023-05-06 DIAGNOSIS — G4733 Obstructive sleep apnea (adult) (pediatric): Secondary | ICD-10-CM | POA: Diagnosis not present

## 2023-05-06 DIAGNOSIS — Z170 Estrogen receptor positive status [ER+]: Secondary | ICD-10-CM | POA: Diagnosis not present

## 2023-05-06 NOTE — Progress Notes (Unsigned)
Referring Provider Dorothyann Peng, MD [address] [place],  [place] [address]   CC: No chief complaint on file.     Leslie Salazar is an 58 y.o. female.  HPI: Patient is a pleasant 58 year old female with PMH of right-sided breast cancer with ipsilateral radiation and bilateral mastectomy and implant-based reconstruction now s/p right breast capsulectomy for implant repositioning and bilateral exchange of implants performed 01/27/2023 by Dr. Ulice Bold.  375 cc implant placed in the left, 350 cc implant placed on the right.   Patient was seen most recently here in clinic on 03/08/2023.  At that time, the right inframammary incision through which the capsulectomy and implant exchange was performed.  We have scarred down.  Otherwise, reconstructed breasts had excellent shape and symmetry and exam was benign.  Recommended follow-up with Dr. Ulice Bold in 6 months regarding the scarring for consideration of possible release if it does not improve.  Mechanical massage in interim.  She then was seen by her heme-onc doctor, Dr. Abbe Amsterdam, on 05/06/2023.  Evidently they expressed concern about possible fluid accumulation to the patient involving the right breast and encouraged follow-up with our office.  Today,   No Known Allergies  Outpatient Encounter Medications as of 05/09/2023  Medication Sig Note   aspirin EC 81 MG tablet Take 81 mg by mouth every morning.    atorvastatin (LIPITOR) 20 MG tablet TAKE 1 TABLET(20 MG) BY MOUTH DAILY    Coenzyme Q10 100 MG capsule Take 1 capsule by mouth daily. 05/22/2015: Received from: [facility] Received Sig: Take by mouth.   hydrochlorothiazide (MICROZIDE) 12.5 MG capsule TAKE 1 CAPSULE(12.5 MG) BY MOUTH DAILY    hydrocortisone (ANUSOL-HC) 2.5 % rectal cream Place 1 application rectally 2 (two) times daily.    loratadine (CLARITIN) 10 MG tablet Take 10 mg by mouth every morning.  06/27/2020: As needed   metoprolol  succinate (TOPROL-XL) 25 MG 24 hr tablet TAKE 1 TABLET(25 MG) BY MOUTH DAILY    montelukast (SINGULAIR) 10 MG tablet TAKE 1 TABLET(10 MG) BY MOUTH DAILY    Multiple Vitamins-Minerals (MULTIVITAMIN WOMENS 50+ ADV PO) Take by mouth.    Omega-3 Fatty Acids (FISH OIL PO) Take by mouth. 1 capsule daily    ondansetron (ZOFRAN-ODT) 4 MG disintegrating tablet Take 1 tablet (4 mg total) by mouth every 8 (eight) hours as needed for nausea or vomiting.    traZODone (DESYREL) 50 MG tablet TAKE 1 TABLET(50 MG) BY MOUTH AT BEDTIME AS NEEDED FOR SLEEP    valACYclovir (VALTREX) 500 MG tablet One tab po dailyprn    No facility-administered encounter medications on file as of 05/09/2023.     Past Medical History:  Diagnosis Date   Acne    Cancer (HCC) 2012   RIGHT DUCTAL CARCINOMA INSITU.Marland Kitchen   Complication of anesthesia    Condyloma    ON BUTTOCKS   DCIS (ductal carcinoma in situ)    BRCA 1and 2 Neg./Pos ER, Pos PR   Fibroids    Heart palpitations    High cholesterol    History of angina    HSV-1 (herpes simplex virus 1) infection    Hx of radiation therapy 2013   from april to may - six weeks   Hypertension    Left ovarian cyst    PONV (postoperative nausea and vomiting)    nausea no vomiting   Scoliosis    Sleep apnea    c- pap nightly   Urinary urgency  Wears glasses     Past Surgical History:  Procedure Laterality Date   BREAST CAPSULECTOMY WITH IMPLANT EXCHANGE Right 06/27/2019   Procedure: release of capsular contracture with repositioning of the right implant with possible exchange of right breast implant;  Surgeon: Peggye Form, DO;  Location: [facility];  Service: Plastics;  Laterality: Right;   BREAST RECONSTRUCTION WITH PLACEMENT OF TISSUE EXPANDER AND FLEX HD (ACELLULAR HYDRATED DERMIS) Right 11/16/2012   Procedure: RIGHT LATISSIMUS myocutaneous FLAP WITH PLACEMENT OF TISSUE EXPANDER  TO RIGHT BREAST;  Surgeon: Wayland Denis, DO;  Location: [facility] OR;  Service:  Plastics;  Laterality: Right;   CAPSULECTOMY Bilateral 01/27/2023   Procedure: [id number];  Surgeon: Peggye Form, DO;  Location: [facility];  Service: Plastics;  Laterality: Bilateral;   CARDIAC CATHETERIZATION N/A 02/28/2015   Procedure: Left Heart Cath and Coronary Angiography;  Surgeon: Yates Decamp, MD;  Location: [facility] INVASIVE CV LAB;  Service: Cardiovascular;  Laterality: N/A;   CESAREAN SECTION  2000   ENDOMETRIAL ABLATION W/ NOVASURE  2007   INSERTION OF TISSUE EXPANDER AFTER MASTECTOMY Bilateral 04/20/2011   Breast Ca (r) T1b N0 M0 stage IA infiltrating ductal carcinoma   LAPAROSCOPIC BILATERAL SALPINGO OOPHERECTOMY Bilateral 08/30/2018   Procedure: LAPAROSCOPIC BILATERAL SALPINGO OOPHORECTOMY w/ PERITONEAL WASHINGS;  Surgeon: Genia Del, MD;  Location: [facility];  Service: Gynecology;  Laterality: Bilateral;   LASER ABLATION OF THE CERVIX  1994   FOR DYSPLASIA   LIPOSUCTION WITH LIPOFILLING Right 03/14/2013   Procedure: LIPOSUCTION WITH LIPOFILLING;  Surgeon: Wayland Denis, DO;  Location: [facility];  Service: Plastics;  Laterality: Right;   LIPOSUCTION WITH LIPOFILLING Right 06/27/2019   Procedure: Fat grafting to right breast with excision of back excess skin;  Surgeon: Peggye Form, DO;  Location: [facility];  Service: Plastics;  Laterality: Right;  2 hours, please   PLACEMENT OF BREAST IMPLANTS Bilateral 01/27/2023   Procedure: PLACEMENT OF BREAST IMPLANTS;  Surgeon: Peggye Form, DO;  Location: [facility];  Service: Plastics;  Laterality: Bilateral;   RECONSTRUCTION BREAST W/ LATISSIMUS DORSI FLAP Right 11/16/2012   REMOVAL OF TISSUE EXPANDER AND PLACEMENT OF IMPLANT Right 03/14/2013   Procedure: REMOVAL OF RIGHT BREAST TISSUE EXPANDER WITH PLACEMENT OF RIGHT BREAST IMPLANT;  Surgeon: Wayland Denis, DO;  Location:  SURGERY CENTER;   Service: Plastics;  Laterality: Right;  Removal of Right Breast Tissue Expander with Placement of Right Breast Implant, Possible Lipsuction with Lipofilling   TISSUE EXPANDER PLACEMENT Right 11/16/2012   TISSUE EXPANDER REMOVAL Bilateral 12/2011   TUBAL LIGATION  09/08/2001   BY LAPAROSCOPY ,, HULKA CLIPS TECHNIQUE   WISDOM TOOTH EXTRACTION      Family History  Problem Relation Age of Onset   Diabetes Mother        HAS HAD A KIDNEY TRANSPLATN   Hypertension Mother    Kidney failure Mother    Hypertension Father    Gallstones Father    Hypertension Sister    Gallstones Sister    Hypertension Sister    Colon cancer Paternal Aunt    Lung cancer Maternal Grandfather     Social History   Social History Narrative   Not on file     Review of Systems General: Denies fevers or chills Cardio: Denies chest pain Pulmonary: Denies difficulty breathing  Physical Exam    04/14/2023    9:36 AM 03/08/2023   11:10 AM 03/08/2023   11:09 AM  Vitals with BMI  Height 5\' 2"     Weight 161 lbs 13 oz    BMI 29.59    Systolic 110 135 914  Diastolic 70 90 95  Pulse 69 55 57    General:  No acute distress, nontoxic appearing  Respiratory: No increased work of breathing Neuro: Alert and oriented Psychiatric: Normal mood and affect   Assessment/Plan ***  Evelena Leyden PA-C 05/06/2023, 2:47 PM

## 2023-05-09 ENCOUNTER — Ambulatory Visit (INDEPENDENT_AMBULATORY_CARE_PROVIDER_SITE_OTHER): Payer: Federal, State, Local not specified - PPO | Admitting: Physician Assistant

## 2023-05-09 ENCOUNTER — Encounter: Payer: Self-pay | Admitting: Physician Assistant

## 2023-05-09 VITALS — BP 119/79 | HR 63 | Ht 62.0 in | Wt 154.0 lb

## 2023-05-09 DIAGNOSIS — T85848A Pain due to other internal prosthetic devices, implants and grafts, initial encounter: Secondary | ICD-10-CM

## 2023-05-09 DIAGNOSIS — Z923 Personal history of irradiation: Secondary | ICD-10-CM | POA: Diagnosis not present

## 2023-05-09 DIAGNOSIS — Z9013 Acquired absence of bilateral breasts and nipples: Secondary | ICD-10-CM

## 2023-05-12 ENCOUNTER — Other Ambulatory Visit: Payer: Federal, State, Local not specified - PPO

## 2023-05-15 DIAGNOSIS — R059 Cough, unspecified: Secondary | ICD-10-CM | POA: Diagnosis not present

## 2023-05-15 DIAGNOSIS — R52 Pain, unspecified: Secondary | ICD-10-CM | POA: Diagnosis not present

## 2023-05-15 DIAGNOSIS — R0981 Nasal congestion: Secondary | ICD-10-CM | POA: Diagnosis not present

## 2023-05-15 DIAGNOSIS — J019 Acute sinusitis, unspecified: Secondary | ICD-10-CM | POA: Diagnosis not present

## 2023-05-15 DIAGNOSIS — R0989 Other specified symptoms and signs involving the circulatory and respiratory systems: Secondary | ICD-10-CM | POA: Diagnosis not present

## 2023-05-17 ENCOUNTER — Ambulatory Visit
Admission: RE | Admit: 2023-05-17 | Discharge: 2023-05-17 | Disposition: A | Discharge status: Home / Self Care | Payer: Federal, State, Local not specified - PPO | Source: Ambulatory Visit | Attending: Physician Assistant

## 2023-05-17 ENCOUNTER — Other Ambulatory Visit: Payer: Self-pay | Admitting: Physician Assistant

## 2023-05-17 DIAGNOSIS — Z9013 Acquired absence of bilateral breasts and nipples: Secondary | ICD-10-CM

## 2023-05-17 DIAGNOSIS — Z9882 Breast implant status: Secondary | ICD-10-CM | POA: Diagnosis not present

## 2023-05-17 DIAGNOSIS — T85848A Pain due to other internal prosthetic devices, implants and grafts, initial encounter: Secondary | ICD-10-CM

## 2023-05-17 DIAGNOSIS — N644 Mastodynia: Secondary | ICD-10-CM | POA: Diagnosis not present

## 2023-05-19 ENCOUNTER — Encounter: Payer: Self-pay | Admitting: Dermatology

## 2023-05-19 ENCOUNTER — Ambulatory Visit: Payer: Federal, State, Local not specified - PPO | Admitting: Dermatology

## 2023-05-19 VITALS — BP 126/76

## 2023-05-19 DIAGNOSIS — L810 Postinflammatory hyperpigmentation: Secondary | ICD-10-CM

## 2023-05-19 DIAGNOSIS — L910 Hypertrophic scar: Secondary | ICD-10-CM

## 2023-05-19 NOTE — Patient Instructions (Addendum)
 Hello Leslie Salazar,  Thank you for visiting my office today. Your dedication to improving your skin health is greatly appreciated. Here is a summary of the key instructions and recommendations from today's consultation:  Keloid Injections: Discontinue keloid injections to avoid skin atrophy.  Pigment Correction:   Product: Use La Roche-Posay Mela B3 Pigment Correcting Serum to help lighten the pigment.   Application: Apply consistently.   Duration: Use for 4 to 6 months to see significant results.  Sun Protection:   Daily Use: Utilize the daily moisturizer with sunscreen provided as a sample. It's light, non-smelly, and does not leave a white cast.   Outdoor Activities: Ensure to use a different, more robust sunscreen for outdoor activities.  Skin Health:   Product: A sample of a hydrating cleanser from La Roche-Posay has been provided, beneficial for maintaining your skin's health.  Follow-Up: We will follow up as needed to monitor your progress.  Please feel free to reach out if you have any questions or concerns. Enjoy the rest of your day!  Best regards,  Dr. [facility]  Important Information  Due to recent changes in healthcare laws, you may see results of your pathology and/or laboratory studies on MyChart before the doctors have had a chance to review them. We understand that in some cases there may be results that are confusing or concerning to you. Please understand that not all results are received at the same time and often the doctors may need to interpret multiple results in order to provide you with the best plan of care or course of treatment. Therefore, we ask that you please give us  2 business days to thoroughly review all your results before contacting the office for clarification. Should we see a critical lab result, you will be contacted sooner.   If You Need Anything After Your Visit  If you have any questions or concerns for your doctor, please call  our main line at [phone] If no one answers, please leave a voicemail as directed and we will return your call as soon as possible. Messages left after 4 pm will be answered the following business day.   You may also send us  a message via MyChart. We typically respond to MyChart messages within 1-2 business days.  For prescription refills, please ask your pharmacy to contact our office. Our fax number is [phone].  If you have an urgent issue when the clinic is closed that cannot wait until the next business day, you can page your doctor at the number below.    Please note that while we do our best to be available for urgent issues outside of office hours, we are not available 24/7.   If you have an urgent issue and are unable to reach us , you may choose to seek medical care at your doctor's office, retail clinic, urgent care center, or emergency room.  If you have a medical emergency, please immediately call 911 or go to the emergency department. In the event of inclement weather, please call our main line at [phone] for an update on the status of any delays or closures.  Dermatology Medication Tips: Please keep the boxes that topical medications come in in order to help keep track of the instructions about where and how to use these. Pharmacies typically print the medication instructions only on the boxes and not directly on the medication tubes.   If your medication is too expensive, please contact our office at [phone] or send us  a message  through MyChart.   We are unable to tell what your co-pay for medications will be in advance as this is different depending on your insurance coverage. However, we may be able to find a substitute medication at lower cost or fill out paperwork to get insurance to cover a needed medication.   If a prior authorization is required to get your medication covered by your insurance company, please allow us  1-2 business days to complete this  process.  Drug prices often vary depending on where the prescription is filled and some pharmacies may offer cheaper prices.  The website www.goodrx.com contains coupons for medications through different pharmacies. The prices here do not account for what the cost may be with help from insurance (it may be cheaper with your insurance), but the website can give you the price if you did not use any insurance.  - You can print the associated coupon and take it with your prescription to the pharmacy.  - You may also stop by our office during regular business hours and pick up a GoodRx coupon card.  - If you need your prescription sent electronically to a different pharmacy, notify our office through [facility] or by phone at [phone]

## 2023-05-19 NOTE — Progress Notes (Signed)
   Follow-Up Visit   Subjective  Leslie Salazar is a 58 y.o. female who presents for the following: keloid f/u  Patient present today for follow up visit. Patient was last evaluated on 03/22/23. Patient reports sxs are better. Pt stated that the spot is flattened out tremendously. Patient denies medication changes.  The following portions of the chart were reviewed this encounter and updated as appropriate: medications, allergies, medical history  Review of Systems:  No other skin or systemic complaints except as noted in HPI or Assessment and Plan.  Objective  Well appearing patient in no apparent distress; mood and affect are within normal limits.   A focused examination was performed of the following areas: left jaw line   Relevant exam findings are noted in the Assessment and Plan.          Assessment & Plan    Keloid   Assessment: The keloid has significantly improved and is almost resolved. Further injections are not recommended due to the risk of atrophy.   Plan:   Discontinue keloid injections.   Monitor for continued improvement.     POST-INFLAMMATORY HYPERPIGMENTATION (PIH) Exam: hyperpigmented macules and/or patches at face   This is a benign condition that comes from having previous inflammation in the skin and will fade with time over months to sometimes years. Recommend daily sun protection including sunscreen SPF 30+ to sun-exposed areas. - Recommend treating any itchy or red areas on the skin quickly to prevent new areas of PIH. Treating with prescription medicines such as hydroquinone may help fade dark spots faster.    Treatment Plan:  - Recommended using LRP MelaB3 to use on the area BID to help lightening area where pickers nodule was located. Sample s provided. -Daily SPF is required, samples given   Return if symptoms worsen or fail to improve.    Documentation: I have reviewed the above documentation for accuracy and completeness, and I  agree with the above.  I, Shirron Maranda, CMA, am acting as scribe for [employer], DO.   Delon Lenis, DO

## 2023-05-29 DIAGNOSIS — G4733 Obstructive sleep apnea (adult) (pediatric): Secondary | ICD-10-CM | POA: Diagnosis not present

## 2023-06-03 DIAGNOSIS — R059 Cough, unspecified: Secondary | ICD-10-CM | POA: Diagnosis not present

## 2023-06-03 DIAGNOSIS — Z9189 Other specified personal risk factors, not elsewhere classified: Secondary | ICD-10-CM | POA: Diagnosis not present

## 2023-06-03 DIAGNOSIS — Z1152 Encounter for screening for COVID-19: Secondary | ICD-10-CM | POA: Diagnosis not present

## 2023-06-03 DIAGNOSIS — Z170 Estrogen receptor positive status [ER+]: Secondary | ICD-10-CM | POA: Diagnosis not present

## 2023-06-03 DIAGNOSIS — J019 Acute sinusitis, unspecified: Secondary | ICD-10-CM | POA: Diagnosis not present

## 2023-06-03 DIAGNOSIS — C50111 Malignant neoplasm of central portion of right female breast: Secondary | ICD-10-CM | POA: Diagnosis not present

## 2023-06-03 DIAGNOSIS — N644 Mastodynia: Secondary | ICD-10-CM | POA: Diagnosis not present

## 2023-06-03 DIAGNOSIS — R509 Fever, unspecified: Secondary | ICD-10-CM | POA: Diagnosis not present

## 2023-06-27 DIAGNOSIS — G4733 Obstructive sleep apnea (adult) (pediatric): Secondary | ICD-10-CM | POA: Diagnosis not present

## 2023-06-29 ENCOUNTER — Other Ambulatory Visit: Payer: Self-pay | Admitting: Internal Medicine

## 2023-06-29 ENCOUNTER — Encounter: Payer: Self-pay | Admitting: Internal Medicine

## 2023-06-29 DIAGNOSIS — Z636 Dependent relative needing care at home: Secondary | ICD-10-CM

## 2023-07-01 DIAGNOSIS — G4733 Obstructive sleep apnea (adult) (pediatric): Secondary | ICD-10-CM | POA: Diagnosis not present

## 2023-07-12 ENCOUNTER — Ambulatory Visit: Payer: Self-pay | Admitting: Licensed Clinical Social Worker

## 2023-07-14 ENCOUNTER — Other Ambulatory Visit: Payer: Self-pay | Admitting: Internal Medicine

## 2023-07-15 NOTE — Patient Outreach (Signed)
 Care Coordination   Initial Visit Note   07/12/2023 Name: Leslie Salazar MRN: [id number] DOB: Sep 18, 1965  Leslie Salazar is a 58 y.o. year old female who sees Dorothyann Peng, MD for primary care. I spoke with  Leslie Salazar by phone today.  What matters to the patients health and wellness today?  Caregiver Stress    Goals Addressed             This Visit's Progress    Caregiver Stress   On track    Activities and task to complete in order to accomplish goals.   Keep all upcoming appointments discussed today Continue with compliance of taking medication prescribed by Doctor Implement healthy coping skills discussed to assist with management of symptoms         SDOH assessments and interventions completed:  Yes  SDOH Interventions Today    Flowsheet Row Most Recent Value  SDOH Interventions   Food Insecurity Interventions Intervention Not Indicated  Housing Interventions Intervention Not Indicated  Transportation Interventions Community Resources Provided, Patient Resources (Friends/Family)  Utilities Interventions Intervention Not Indicated        Care Coordination Interventions:  Yes, provided  Interventions Today    Flowsheet Row Most Recent Value  Chronic Disease   Chronic disease during today's visit Other  [Caregiver Stress]  General Interventions   General Interventions Discussed/Reviewed General Interventions Discussed, Doctor Visits, Community Resources, Level of Care  Doctor Visits Discussed/Reviewed Doctor Visits Discussed  Level of Care Adult Daycare, Assisted Living, Personal Care Services, Public librarian Medicaid  [Discussed process of Spending Down to assist loved one's eligibility for MA]  Exercise Interventions   Exercise Discussed/Reviewed Exercise Discussed  Education Interventions   Applications Medicaid  [Discussed process of Spending Down to assist loved one's eligibility for MA]  Mental Health Interventions   Mental  Health Discussed/Reviewed Mental Health Discussed, Coping Strategies, Anxiety  [Stress management strategies discussed]  Nutrition Interventions   Nutrition Discussed/Reviewed Nutrition Discussed  Pharmacy Interventions   Pharmacy Dicussed/Reviewed Pharmacy Topics Discussed, Medication Adherence  Safety Interventions   Safety Discussed/Reviewed Safety Discussed       Follow up plan: Follow up call scheduled for 2-6 weeks    Encounter Outcome:  Patient Visit Completed   Jenel Lucks, LCSW [place]  [facility], [facility] Clinical Social Worker Direct Dial: [phone]  Fax: [phone] Website: [email or web address] 1:25 PM

## 2023-07-15 NOTE — Patient Instructions (Signed)
 Visit Information  Thank you for taking time to visit with me today. Please don't hesitate to contact me if I can be of assistance to you.   Following are the goals we discussed today:   Goals Addressed             This Visit's Progress    Caregiver Stress   On track    Activities and task to complete in order to accomplish goals.   Keep all upcoming appointments discussed today Continue with compliance of taking medication prescribed by Doctor Implement healthy coping skills discussed to assist with management of symptoms         Our next appointment is by telephone on 4/15 at 11 AM  Please call the care guide team at [phone] if you need to cancel or reschedule your appointment.   If you are experiencing a Mental Health or Behavioral Health Crisis or need someone to talk to, please call the Suicide and Crisis Lifeline: 988 go to [facility] Urgent [facility] [address] [place] [phone]) call 911   Patient verbalizes understanding of instructions and care plan provided today and agrees to view in MyChart. Active MyChart status and patient understanding of how to access instructions and care plan via MyChart confirmed with patient.     Windy Fast [facility]  [facility], [facility] Clinical Social Worker Direct Dial: [phone]  Fax: [phone] Website: [email or web address] 1:26 PM

## 2023-07-26 ENCOUNTER — Ambulatory Visit: Payer: Self-pay | Admitting: Licensed Clinical Social Worker

## 2023-07-26 DIAGNOSIS — Z7189 Other specified counseling: Secondary | ICD-10-CM

## 2023-07-28 DIAGNOSIS — G4733 Obstructive sleep apnea (adult) (pediatric): Secondary | ICD-10-CM | POA: Diagnosis not present

## 2023-07-29 NOTE — Patient Outreach (Signed)
 Complex Care Management   Visit Note  07/26/2023  Name:  Leslie Salazar MRN: [id number] DOB: 09/29/1965  Situation: Referral received for Complex Care Management related to  Caregiver Stress  I obtained verbal consent from Patient.  Visit completed with Pt  on the phone  Background:   Past Medical History:  Diagnosis Date   Acne    Cancer (HCC) 2012   RIGHT DUCTAL CARCINOMA INSITU.Aaron Aas   Complication of anesthesia    Condyloma    ON BUTTOCKS   DCIS (ductal carcinoma in situ)    BRCA 1and 2 Neg./Pos ER, Pos PR   Fibroids    Heart palpitations    High cholesterol    History of angina    HSV-1 (herpes simplex virus 1) infection    Hx of radiation therapy 2013   from april to may - six weeks   Hypertension    Left ovarian cyst    PONV (postoperative nausea and vomiting)    nausea no vomiting   Scoliosis    Sleep apnea    c- pap nightly   Urinary urgency    Wears glasses     Assessment: Patient Reported Symptoms:  Cognitive Cognitive Status: Alert and oriented to person, place, and time      Neurological Neurological Review of Symptoms: No symptoms reported    HEENT HEENT Symptoms Reported: No symptoms reported      Cardiovascular Cardiovascular Symptoms Reported: No symptoms reported    Respiratory Respiratory Symptoms Reported: No symptoms reported    Endocrine Patient reports the following symptoms related to hypoglycemia or hyperglycemia : No symptoms reported    Gastrointestinal Gastrointestinal Symptoms Reported: No symptoms reported      Genitourinary Genitourinary Symptoms Reported: No symptoms reported    Integumentary Integumentary Symptoms Reported: No symptoms reported    Musculoskeletal Musculoskelatal Symptoms Reviewed: No symptoms reported   Falls in the past year?: No Number of falls in past year: 1 or less Was there an injury with Fall?: No Fall Risk Category Calculator: 0 Patient Fall Risk Level: Low Fall Risk    Psychosocial        Quality of Family Relationships: involved Do you feel physically threatened by others?: No      04/14/2023    9:36 AM  Depression screen PHQ 2/9  Decreased Interest 0  Down, Depressed, Hopeless 0  PHQ - 2 Score 0  Altered sleeping 0  Tired, decreased energy 0  Change in appetite 0  Feeling bad or failure about yourself  0  Trouble concentrating 0  Moving slowly or fidgety/restless 0  Suicidal thoughts 0  PHQ-9 Score 0  Difficult doing work/chores Not difficult at all    There were no vitals filed for this visit.  Medications Reviewed Today     Reviewed by Adriana Albany, LCSW (Social Worker) on 07/26/23 at 1111  Med List Status: <None>   Medication Order Taking? Sig Documenting Provider Last Dose Status Informant  aspirin  EC 81 MG tablet [id number]  Take 81 mg by mouth every morning. [provider]  Active Self  atorvastatin  (LIPITOR) 20 MG tablet [id number]  TAKE 1 TABLET(20 MG) BY MOUTH DAILY Cleave Curling, MD  Active   Coenzyme Q10 100 MG capsule [id number]  Take 1 capsule by mouth daily. [provider]  Active Self           Med Note Nadean August   Thu May 22, 2015  8:21 AM) Received from: [facility]  Received Sig: Take by mouth.  hydrochlorothiazide  (MICROZIDE ) 12.5 MG capsule [id number]  TAKE 1 CAPSULE(12.5 MG) BY MOUTH DAILY Cleave Curling, MD  Active   hydrocortisone  (ANUSOL -HC) 2.5 % rectal cream [id number]  Place 1 application rectally 2 (two) times daily. Lavoie, Marie-Lyne, MD  Active   loratadine (CLARITIN) 10 MG tablet [id number]  Take 10 mg by mouth every morning.  [provider]  Active Self           Med Note Alicia Apa, Selma [place]   Fri Jun 27, 2020 11:06 AM) As needed  metoprolol  succinate (TOPROL -XL) 25 MG 24 hr tablet [id number]  TAKE 1 TABLET(25 MG) BY MOUTH DAILY Cleave Curling, MD  Active   montelukast  (SINGULAIR ) 10 MG tablet [id number]  TAKE 1 TABLET(10 MG) BY MOUTH DAILY Cleave Curling, MD  Active    Multiple Vitamins-Minerals (MULTIVITAMIN WOMENS 50+ ADV PO) [id number]  Take by mouth. [provider]  Active Self  Omega-3 Fatty Acids (FISH OIL PO) [id number]  Take by mouth. 1 capsule daily [provider]  Active Self  ondansetron  (ZOFRAN -ODT) 4 MG disintegrating tablet [id number]  Take 1 tablet (4 mg total) by mouth every 8 (eight) hours as needed for nausea or vomiting. Jhonnie Mosher, PA-C  Active   traZODone  (DESYREL ) 50 MG tablet [id number]  TAKE 1 TABLET(50 MG) BY MOUTH AT BEDTIME AS NEEDED FOR SLEEP Cleave Curling, MD  Active   valACYclovir  (VALTREX ) 500 MG tablet [id number]  One tab po dailyprn Cleave Curling, MD  Active             Recommendation:   Continue utilizing strategies discussed to assist with symptom management  Follow Up Plan:   Telephone follow-up 2-4 weeks  Alease Hunter, LCSW [facility]  [facility], [facility] Clinical Social Worker Direct Dial: [phone]  Fax: [phone] Website: [email or web address] 12:05 PM

## 2023-07-29 NOTE — Patient Instructions (Signed)
 Visit Information  Thank you for taking time to visit with me today. Please don't hesitate to contact me if I can be of assistance to you before our next scheduled appointment.  Our next appointment is by telephone on 4/29 at 10:30 AM Please call the care guide team at [phone] if you need to cancel or reschedule your appointment.   Following is a copy of your care plan:   Goals Addressed             This Visit's Progress    COMPLETED: Caregiver Stress       Activities and task to complete in order to accomplish goals.   Keep all upcoming appointments discussed today Continue with compliance of taking medication prescribed by Doctor Implement healthy coping skills discussed to assist with management of symptoms      LCSW [facility] Social Work Care Plan   On track    Problems:   Caregiver Stress  CSW Clinical Goal(s):   Over the next 90 days the Patient will work with Child psychotherapist to address concerns related to Caregiver Stress .  Interventions:  Mental Health:  Evaluation of current treatment plan related to Caregiver Stress Active listening / Reflection utilized Caregiver stress acknowledged :Patient receives support from sister in the care of father Discussed caregiver resources and support: Referral to Care Guide made to assist with barriers to transportation Emotional Support Provided Mindfulness or Relaxation training provided  Patient Goals/Self-Care Activities:  Increase coping skills, healthy habits, and self-management skills  Plan:   Telephone follow up appointment with care management team member scheduled for:  2-4 weeks        Please call the Suicide and Crisis Lifeline: 988 call 911 if you are experiencing a Mental Health or Behavioral Health Crisis or need someone to talk to.  Patient verbalizes understanding of instructions and care plan provided today and agrees to view in MyChart. Active MyChart status and patient understanding of how to  access instructions and care plan via MyChart confirmed with patient.     Arlis Bent [facility]  [facility], [facility] Clinical Social Worker Direct Dial: [phone]  Fax: [phone] Website: [email or web address] 12:06 PM

## 2023-07-31 DIAGNOSIS — S0992XA Unspecified injury of nose, initial encounter: Secondary | ICD-10-CM | POA: Diagnosis not present

## 2023-08-01 ENCOUNTER — Telehealth: Payer: Self-pay | Admitting: *Deleted

## 2023-08-01 NOTE — Progress Notes (Signed)
 Complex Care Management Note Care Guide Note  08/01/2023 Name: Leslie Salazar MRN: [id number] DOB: 02/18/1966  Leslie Salazar is a 58 y.o. year old female who is a primary care patient of Cleave Curling, MD . The community resource team was consulted for assistance with Transportation Needs   SDOH screenings and interventions completed:  Yes     SDOH Interventions Today    Flowsheet Row Most Recent Value  SDOH Interventions   Transportation Interventions Payor Benefit  [Provided resourses for patient to find transportation to benefit father and patient work load as caregiver]        Care guide performed the following interventions: Patient provided with information about care guide support team and interviewed to confirm resource needs.  Follow Up Plan:  No further follow up planned at this time. The patient has been provided with needed resources.  Encounter Outcome:  Patient Visit Completed Leslie Salazar  [facility] Care Guide  Direct Dial:[phone] Fax:[phone] Website: [email or web address]

## 2023-08-03 ENCOUNTER — Encounter: Payer: Self-pay | Admitting: Internal Medicine

## 2023-08-03 ENCOUNTER — Other Ambulatory Visit: Payer: Self-pay | Admitting: Internal Medicine

## 2023-08-04 ENCOUNTER — Other Ambulatory Visit: Payer: Self-pay

## 2023-08-04 MED ORDER — VALACYCLOVIR HCL 500 MG PO TABS: ORAL_TABLET | ORAL | 1 refills | Status: DC

## 2023-08-09 ENCOUNTER — Other Ambulatory Visit: Payer: Self-pay | Admitting: Licensed Clinical Social Worker

## 2023-08-10 NOTE — Patient Instructions (Signed)
 Visit Information  Thank you for taking time to visit with me today. Please don't hesitate to contact me if I can be of assistance to you before our next scheduled appointment.  Your next care management appointment is by telephone on 6/10 at 10:30 AM   Please call the care guide team at [phone] if you need to cancel, schedule, or reschedule an appointment.   Please call the Suicide and Crisis Lifeline: 988 go to [facility] Urgent [facility] [address], [place] [phone]) call 911 if you are experiencing a Mental Health or Behavioral Health Crisis or need someone to talk to.  Alease Hunter, LCSW [place]  [facility], [facility] Clinical Social Worker Direct Dial: [phone]  Fax: [phone] Website: [email or web address] 3:28 PM

## 2023-08-10 NOTE — Patient Outreach (Signed)
 Complex Care Management   Visit Note  08/10/2023  Name:  Leslie Salazar MRN: [id number] DOB: 05-19-1965  Situation: Referral received for Complex Care Management related to  Caregiver Stress  I obtained verbal consent from Patient.  Visit completed with pt  on the phone  Background:   Past Medical History:  Diagnosis Date   Acne    Cancer (HCC) 2012   RIGHT DUCTAL CARCINOMA INSITU.Aaron Aas   Complication of anesthesia    Condyloma    ON BUTTOCKS   DCIS (ductal carcinoma in situ)    BRCA 1and 2 Neg./Pos ER, Pos PR   Fibroids    Heart palpitations    High cholesterol    History of angina    HSV-1 (herpes simplex virus 1) infection    Hx of radiation therapy 2013   from april to may - six weeks   Hypertension    Left ovarian cyst    PONV (postoperative nausea and vomiting)    nausea no vomiting   Scoliosis    Sleep apnea    c- pap nightly   Urinary urgency    Wears glasses     Assessment: Patient Reported Symptoms:  Cognitive        Neurological      HEENT        Cardiovascular      Respiratory      Endocrine      Gastrointestinal        Genitourinary      Integumentary      Musculoskeletal          Psychosocial              04/14/2023    9:36 AM  Depression screen PHQ 2/9  Decreased Interest 0  Down, Depressed, Hopeless 0  PHQ - 2 Score 0  Altered sleeping 0  Tired, decreased energy 0  Change in appetite 0  Feeling bad or failure about yourself  0  Trouble concentrating 0  Moving slowly or fidgety/restless 0  Suicidal thoughts 0  PHQ-9 Score 0  Difficult doing work/chores Not difficult at all    There were no vitals filed for this visit.  Medications Reviewed Today     Reviewed by Adriana Albany, LCSW (Social Worker) on 08/10/23 at 1526  Med List Status: <None>   Medication Order Taking? Sig Documenting Provider Last Dose Status Informant  aspirin  EC 81 MG tablet [id number] No Take 81 mg by mouth every morning. [provider] Taking Active Self  atorvastatin  (LIPITOR) 20 MG tablet [id number] No TAKE 1 TABLET(20 MG) BY MOUTH DAILY Cleave Curling, MD Taking Active   Coenzyme Q10 100 MG capsule [id number] No Take 1 capsule by mouth daily. [provider] Taking Active Self           Med Note Nadean August   Thu May 22, 2015  8:21 AM) Received from: [facility] Received Sig: Take by mouth.  hydrochlorothiazide  (MICROZIDE ) 12.5 MG capsule [id number] No TAKE 1 CAPSULE(12.5 MG) BY MOUTH DAILY Cleave Curling, MD Taking Active   hydrocortisone  (ANUSOL -HC) 2.5 % rectal cream [id number] No Place 1 application rectally 2 (two) times daily. Lavoie, Marie-Lyne, MD Taking Active   loratadine (CLARITIN) 10 MG tablet [id number] No Take 10 mg by mouth every morning.  [provider] Taking Active Self           Med Note Alicia Apa, Selma [place]   Fri Jun 27, 2020 11:06 AM) As needed  metoprolol  succinate (TOPROL -XL) 25 MG 24 hr tablet [id number] No TAKE 1 TABLET(25 MG) BY MOUTH DAILY Cleave Curling, MD Taking Active   montelukast  (SINGULAIR ) 10 MG tablet [id number] No TAKE 1 TABLET(10 MG) BY MOUTH DAILY Cleave Curling, MD Taking Active   Multiple Vitamins-Minerals (MULTIVITAMIN WOMENS 50+ ADV PO) [id number] No Take by mouth. [provider] Taking Active Self  Omega-3 Fatty Acids (FISH OIL PO) [id number] No Take by mouth. 1 capsule daily [provider] Taking Active Self  ondansetron  (ZOFRAN -ODT) 4 MG disintegrating tablet [id number] No Take 1 tablet (4 mg total) by mouth every 8 (eight) hours as needed for nausea or vomiting. Jhonnie Mosher, PA-C Taking Active   traZODone  (DESYREL ) 50 MG tablet [id number] No TAKE 1 TABLET(50 MG) BY MOUTH AT BEDTIME AS NEEDED FOR SLEEP Cleave Curling, MD Taking Active   valACYclovir  (VALTREX ) 500 MG tablet [id number]  One tab po dailyprn Cleave Curling, MD  Active             Recommendation:   Continue utilizing strategies discussed to  assist with symptom management  Follow Up Plan:   Telephone follow-up in 1 month  Alease Hunter, LCSW [facility]  [facility], [facility] Clinical Social Worker Direct Dial: [phone]  Fax: [phone] Website: [email or web address] 3:27 PM

## 2023-08-27 DIAGNOSIS — G4733 Obstructive sleep apnea (adult) (pediatric): Secondary | ICD-10-CM | POA: Diagnosis not present

## 2023-09-02 ENCOUNTER — Other Ambulatory Visit: Payer: Self-pay

## 2023-09-06 ENCOUNTER — Ambulatory Visit (INDEPENDENT_AMBULATORY_CARE_PROVIDER_SITE_OTHER): Payer: Federal, State, Local not specified - PPO | Admitting: Plastic Surgery

## 2023-09-06 ENCOUNTER — Encounter: Payer: Self-pay | Admitting: Plastic Surgery

## 2023-09-06 DIAGNOSIS — Z9013 Acquired absence of bilateral breasts and nipples: Secondary | ICD-10-CM | POA: Diagnosis not present

## 2023-09-06 DIAGNOSIS — D0511 Intraductal carcinoma in situ of right breast: Secondary | ICD-10-CM

## 2023-09-06 DIAGNOSIS — Z923 Personal history of irradiation: Secondary | ICD-10-CM

## 2023-09-06 NOTE — Progress Notes (Signed)
   Subjective:    Patient ID: Leslie Salazar, female    DOB: [birth year], 58 y.o.   MRN: [id number]  The patient is a 58 year old female here for follow-up on her breast reconstruction.  In 2014 she was diagnosed with infiltrating ductal carcinoma of the right breast.  She had bilateral mastectomies after lumpectomies.  Right breast was radiated.  The tumor was estrogen progesterone and HER2 positive.  She was negative for the BRCA gene.  She had a right sided latissimus muscle flap.  She had some pain in the right breast and was found to have an implant that was leaking so underwent bilateral implant exchange October 2024.  She has Mentor smooth round high-profile gel 375 cc on the left.  She has a Dance movement psychotherapist smooth round high profile gel 350 cc implant in on the right. She is doing very well.  No pain or discomfort.  Her dad is living with her now so she has a little extra stress but seems to be handling it well.  Her breasts are nice and soft and no capsular contracture noted.  The positioning of the implants appears to have improved as well.      Review of Systems  Constitutional: Negative.   HENT: Negative.    Eyes: Negative.   Respiratory: Negative.    Cardiovascular: Negative.   Gastrointestinal: Negative.   Endocrine: Negative.   Genitourinary: Negative.        Objective:   Physical Exam Constitutional:      Appearance: Normal appearance.  HENT:     Head: Atraumatic.  Cardiovascular:     Rate and Rhythm: Normal rate.  Pulmonary:     Effort: Pulmonary effort is normal.  Abdominal:     Palpations: Abdomen is soft.  Skin:    General: Skin is warm.     Capillary Refill: Capillary refill takes less than 2 seconds.  Neurological:     Mental Status: She is oriented to person, place, and time.  Psychiatric:        Mood and Affect: Mood normal.        Behavior: Behavior normal.        Thought Content: Thought content normal.        Assessment & Plan:     ICD-10-CM   1.  Status post radiation therapy  Z92.3     2. 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 Ductal carcinoma in situ (DCIS) of right breast  D05.11       Plan for a 1 year follow-up.  Ultrasound can be done in the next 1 to 2 years according to the patient's comfort level.  Pictures were obtained of the patient and placed in the chart with the patient's or guardian's permission.

## 2023-09-19 ENCOUNTER — Encounter: Payer: Self-pay | Admitting: Obstetrics and Gynecology

## 2023-09-19 ENCOUNTER — Ambulatory Visit (INDEPENDENT_AMBULATORY_CARE_PROVIDER_SITE_OTHER): Admitting: Obstetrics and Gynecology

## 2023-09-19 ENCOUNTER — Other Ambulatory Visit (HOSPITAL_COMMUNITY)
Admission: RE | Admit: 2023-09-19 | Discharge: 2023-09-19 | Disposition: A | Discharge status: Home / Self Care | Source: Ambulatory Visit | Attending: Obstetrics and Gynecology | Admitting: Obstetrics and Gynecology

## 2023-09-19 VITALS — BP 118/78 | HR 70 | Ht 63.0 in | Wt 147.0 lb

## 2023-09-19 DIAGNOSIS — Z1331 Encounter for screening for depression: Secondary | ICD-10-CM | POA: Diagnosis not present

## 2023-09-19 DIAGNOSIS — Z124 Encounter for screening for malignant neoplasm of cervix: Secondary | ICD-10-CM | POA: Insufficient documentation

## 2023-09-19 DIAGNOSIS — E2839 Other primary ovarian failure: Secondary | ICD-10-CM

## 2023-09-19 DIAGNOSIS — Z01419 Encounter for gynecological examination (general) (routine) without abnormal findings: Secondary | ICD-10-CM | POA: Diagnosis not present

## 2023-09-19 NOTE — Addendum Note (Signed)
 Addended by: Reinaldo Caras on: 09/19/2023 02:45 PM   Modules accepted: Orders

## 2023-09-19 NOTE — Progress Notes (Signed)
 58 y.o. y.o. female here for annual exam. No LMP recorded. Patient is postmenopausal.   G1P1L1  Married, now separated.  Not dating or sexually active.   RP:  Established patient presenting for annual gyn exam    HPI:  Postmenopause, well on no HRT, no PMB.  No pelvic pain, but does report vaginal dryness.  S/P BSO 08/2018 for ovarian cyst. Patho benign.  Rt Breast Infiltrating Ductal Ca, ER-PR positive.  Finished Tamoxifen >4 yrs ago.  BrCa1-2 negative. Had bilateral mastectomy/breast reconstruction with tattoos.  Bilateral Breast US  in 05/2019.  Using Vagifem  and Vit E in vagina.  Will stop Vagifem , abstinent.  Replens suggested for dryness. Pap Neg 08/2021. Repeat at 3 years.  Condyloma treated on buttocks last year.  Has been treated with Aldara  for perianal wart in the past.  Valtrex  prophylaxis, no recurrence, will stop and observe. BMI 28.49.  Gym with trainer 2 times a week and walking 5-7 times a week. Health labs with Fam MD.  Gracy Law 12/2021. BMD -0.9 on 10/2021 repeat in 2 years. No fractures. Takes care of her 58 year old father.   Body mass index is 26.04 kg/m.     09/19/2023    1:33 PM 04/14/2023    9:36 AM 12/22/2022    4:25 PM  Depression screen PHQ 2/9  Decreased Interest 0 0 0  Down, Depressed, Hopeless 0 0 0  PHQ - 2 Score 0 0 0  Altered sleeping  0 0  Tired, decreased energy  0 0  Change in appetite  0 0  Feeling bad or failure about yourself   0 0  Trouble concentrating  0 0  Moving slowly or fidgety/restless  0 0  Suicidal thoughts  0 0  PHQ-9 Score  0 0  Difficult doing work/chores  Not difficult at all Not difficult at all    Blood pressure 118/78, pulse 70, height 5\' 3"  (1.6 m), weight 147 lb (66.7 kg), SpO2 98%.     Component Value Date/Time   DIAGPAP  09/09/2021 1123    - Negative for intraepithelial lesion or malignancy (NILM)   DIAGPAP  09/04/2020 1157    - Negative for intraepithelial lesion or malignancy (NILM)   ADEQPAP  09/09/2021 1123     Satisfactory for evaluation; transformation zone component ABSENT.   ADEQPAP  09/04/2020 1157    Satisfactory for evaluation; transformation zone component PRESENT.    GYN HISTORY:    Component Value Date/Time   DIAGPAP  09/09/2021 1123    - Negative for intraepithelial lesion or malignancy (NILM)   DIAGPAP  09/04/2020 1157    - Negative for intraepithelial lesion or malignancy (NILM)   ADEQPAP  09/09/2021 1123    Satisfactory for evaluation; transformation zone component ABSENT.   ADEQPAP  09/04/2020 1157    Satisfactory for evaluation; transformation zone component PRESENT.    OB History  Gravida Para Term Preterm AB Living  1 1 1   1   SAB IAB Ectopic Multiple Live Births      1    # Outcome Date GA Lbr Len/2nd Weight Sex Type Anes PTL Lv  1 Term     M CS-Unspec  N LIV    Past Medical History:  Diagnosis Date   Acne    Cancer (HCC) 2012   RIGHT DUCTAL CARCINOMA INSITU.Aaron Aas   Complication of anesthesia    Condyloma    ON BUTTOCKS   DCIS (ductal carcinoma in situ)    BRCA 1and  2 Neg./Pos ER, Pos PR   Fibroids    Heart palpitations    High cholesterol    History of angina    HSV-1 (herpes simplex virus 1) infection    Hx of radiation therapy 2013   from april to may - six weeks   Hypertension    Left ovarian cyst    PONV (postoperative nausea and vomiting)    nausea no vomiting   Scoliosis    Sleep apnea    c- pap nightly   Urinary urgency    Wears glasses     Past Surgical History:  Procedure Laterality Date   BREAST CAPSULECTOMY WITH IMPLANT EXCHANGE Right 06/27/2019   Procedure: release of capsular contracture with repositioning of the right implant with possible exchange of right breast implant;  Surgeon: Thornell Flirt, DO;  Location: [facility];  Service: Plastics;  Laterality: Right;   BREAST RECONSTRUCTION WITH PLACEMENT OF TISSUE EXPANDER AND FLEX HD (ACELLULAR HYDRATED DERMIS) Right 11/16/2012   Procedure: RIGHT LATISSIMUS  myocutaneous FLAP WITH PLACEMENT OF TISSUE EXPANDER  TO RIGHT BREAST;  Surgeon: Marilou Showman, DO;  Location: [facility];  Service: Plastics;  Laterality: Right;   CAPSULECTOMY Bilateral 01/27/2023   Procedure: [id number];  Surgeon: Thornell Flirt, DO;  Location: [facility];  Service: Plastics;  Laterality: Bilateral;   CARDIAC CATHETERIZATION N/A 02/28/2015   Procedure: Left Heart Cath and Coronary Angiography;  Surgeon: Knox Perl, MD;  Location: [facility] INVASIVE CV LAB;  Service: Cardiovascular;  Laterality: N/A;   CESAREAN SECTION  2000   ENDOMETRIAL ABLATION W/ NOVASURE  2007   INSERTION OF TISSUE EXPANDER AFTER MASTECTOMY Bilateral 04/20/2011   Breast Ca (r) T1b N0 M0 stage IA infiltrating ductal carcinoma   LAPAROSCOPIC BILATERAL SALPINGO OOPHERECTOMY Bilateral 08/30/2018   Procedure: LAPAROSCOPIC BILATERAL SALPINGO OOPHORECTOMY w/ PERITONEAL WASHINGS;  Surgeon: Lavoie, Marie-Lyne, MD;  Location: [facility];  Service: Gynecology;  Laterality: Bilateral;   LASER ABLATION OF THE CERVIX  1994   FOR DYSPLASIA   LIPOSUCTION WITH LIPOFILLING Right 03/14/2013   Procedure: LIPOSUCTION WITH LIPOFILLING;  Surgeon: Marilou Showman, DO;  Location: [facility];  Service: Plastics;  Laterality: Right;   LIPOSUCTION WITH LIPOFILLING Right 06/27/2019   Procedure: Fat grafting to right breast with excision of back excess skin;  Surgeon: Thornell Flirt, DO;  Location: [facility];  Service: Plastics;  Laterality: Right;  2 hours, please   PLACEMENT OF BREAST IMPLANTS Bilateral 01/27/2023   Procedure: PLACEMENT OF BREAST IMPLANTS;  Surgeon: Thornell Flirt, DO;  Location: [facility];  Service: Plastics;  Laterality: Bilateral;   RECONSTRUCTION BREAST W/ LATISSIMUS DORSI FLAP Right 11/16/2012   REMOVAL OF TISSUE EXPANDER AND PLACEMENT OF IMPLANT Right 03/14/2013   Procedure: REMOVAL OF RIGHT  BREAST TISSUE EXPANDER WITH PLACEMENT OF RIGHT BREAST IMPLANT;  Surgeon: Marilou Showman, DO;  Location: [facility];  Service: Plastics;  Laterality: Right;  Removal of Right Breast Tissue Expander with Placement of Right Breast Implant, Possible Lipsuction with Lipofilling   TISSUE EXPANDER PLACEMENT Right 11/16/2012   TISSUE EXPANDER REMOVAL Bilateral 12/2011   TUBAL LIGATION  09/08/2001   BY LAPAROSCOPY ,, HULKA CLIPS TECHNIQUE   WISDOM TOOTH EXTRACTION      Current Outpatient Medications on File Prior to Visit  Medication Sig Dispense Refill   aspirin  EC 81 MG tablet Take 81 mg by mouth every morning.     atorvastatin  (LIPITOR) 20 MG tablet TAKE  1 TABLET(20 MG) BY MOUTH DAILY 90 tablet 2   Coenzyme Q10 100 MG capsule Take 1 capsule by mouth daily.     fluticasone (FLONASE) 50 MCG/ACT nasal spray 1 SPRAY IN EACH NOSTRIL AT BEDTIME.     MAY REPEAT 12 HRS LATER AS NEEDED     hydrochlorothiazide  (MICROZIDE ) 12.5 MG capsule TAKE 1 CAPSULE(12.5 MG) BY MOUTH DAILY 90 capsule 1   metoprolol  succinate (TOPROL -XL) 25 MG 24 hr tablet TAKE 1 TABLET(25 MG) BY MOUTH DAILY 90 tablet 2   montelukast  (SINGULAIR ) 10 MG tablet TAKE 1 TABLET(10 MG) BY MOUTH DAILY 90 tablet 2   Multiple Vitamins-Minerals (MULTIVITAMIN WOMENS 50+ ADV PO) Take by mouth.     Omega-3 Fatty Acids (FISH OIL PO) Take by mouth. 1 capsule daily     traZODone  (DESYREL ) 50 MG tablet TAKE 1 TABLET(50 MG) BY MOUTH AT BEDTIME AS NEEDED FOR SLEEP 90 tablet 1   valACYclovir  (VALTREX ) 500 MG tablet One tab po dailyprn 90 tablet 1   No current facility-administered medications on file prior to visit.    Social History   Socioeconomic History   Marital status: Legally Separated    Spouse name: Not on file   Number of children: 1   Years of education: Not on file   Highest education level: Master's degree (e.g., MA, MS, MEng, MEd, MSW, MBA)  Occupational History   Not on file  Tobacco Use   Smoking status: Never    Smokeless tobacco: Never  Vaping Use   Vaping status: Never Used  Substance and Sexual Activity   Alcohol use: Yes    Alcohol/week: 1.0 standard drink of alcohol    Types: 1 Glasses of wine per week    Comment: social   Drug use: No   Sexual activity: Not Currently    Birth control/protection: Surgical, Post-menopausal    Comment: 1ST INTERCOURSE- 32, PARTNERS- 4, btl & oophorectomy  Other Topics Concern   Not on file  Social History Narrative   Not on file   Social Drivers of Health   Financial Resource Strain: Low Risk  (05/06/2023)   Received from [facility]   Overall Financial Resource Strain (CARDIA)    Difficulty of Paying Living Expenses: Not hard at all  Food Insecurity: No Food Insecurity (07/26/2023)   Hunger Vital Sign    Worried About Running Out of Food in the Last Year: Never true    Ran Out of Food in the Last Year: Never true  Transportation Needs: No Transportation Needs (07/26/2023)   PRAPARE - Administrator, Civil Service (Medical): No    Lack of Transportation (Non-Medical): No  Physical Activity: Insufficiently Active (04/13/2023)   Exercise Vital Sign    Days of Exercise per Week: 2 days    Minutes of Exercise per Session: 30 min  Stress: Stress Concern Present (04/13/2023)   [employer] of Occupational Health - Occupational Stress Questionnaire    Feeling of Stress : To some extent  Social Connections: Moderately Isolated (04/13/2023)   Social Connection and Isolation Panel [NHANES]    Frequency of Communication with Friends and Family: Twice a week    Frequency of Social Gatherings with Friends and Family: Once a week    Attends Religious Services: 1 to 4 times per year    Active Member of [employer] or Organizations: No    Attends Banker Meetings: Not on file    Marital Status: Separated  Intimate Partner Violence: Not At Risk (  07/26/2023)   Humiliation, Afraid, Rape, and Kick questionnaire    Fear of Current or Ex-Partner:  No    Emotionally Abused: No    Physically Abused: No    Sexually Abused: No    Family History  Problem Relation Age of Onset   Diabetes Mother        HAS HAD A KIDNEY TRANSPLATN   Hypertension Mother    Kidney failure Mother    Hypertension Father    Gallstones Father    Hypertension Sister    Gallstones Sister    Hypertension Sister    Colon cancer Paternal Aunt    Lung cancer Maternal Grandfather      No Known Allergies    Patient's last menstrual period was No LMP recorded. Patient is postmenopausal..             Review of Systems Alls systems reviewed and are negative.     Physical Exam Constitutional:      Appearance: Normal appearance.  Genitourinary:     Vulva and urethral meatus normal.     No lesions in the vagina.     Right Labia: No rash, lesions or skin changes.    Left Labia: No lesions, skin changes or rash.    No vaginal discharge or tenderness.     No vaginal prolapse present.    No vaginal atrophy present.     Right Adnexa: absent.    Right Adnexa: not tender and no mass present.    Left Adnexa: absent.    Left Adnexa: not tender and no mass present.    No cervical motion tenderness or discharge.     Uterus is not enlarged, tender or irregular.  Breasts:    Right: Normal.     Left: Normal.  HENT:     Head: Normocephalic.  Neck:     Thyroid : No thyroid  mass, thyromegaly or thyroid  tenderness.  Cardiovascular:     Rate and Rhythm: Normal rate and regular rhythm.     Heart sounds: Normal heart sounds, S1 normal and S2 normal.  Pulmonary:     Effort: Pulmonary effort is normal.     Breath sounds: Normal breath sounds and air entry.  Abdominal:     General: There is no distension.     Palpations: Abdomen is soft. There is no mass.     Tenderness: There is no abdominal tenderness. There is no guarding or rebound.  Musculoskeletal:        General: Normal range of motion.     Cervical back: Full passive range of motion without pain,  normal range of motion and neck supple. No tenderness.     Right lower leg: No edema.     Left lower leg: No edema.  Neurological:     Mental Status: She is alert.  Skin:    General: Skin is warm.  Psychiatric:        Mood and Affect: Mood normal.        Behavior: Behavior normal.        Thought Content: Thought content normal.  Vitals and nursing note reviewed. Exam conducted with a chaperone present.       A:         Well Woman GYN exam                             P:        Pap smear collected today Encouraged  annual mammogram screening Colon cancer screening up-to-date DXA ordered today Labs and immunizations to do with PMD Discussed breast self exams Encouraged healthy lifestyle practices Encouraged Vit D and Calcium    No follow-ups on file.  Reinaldo Caras

## 2023-09-20 ENCOUNTER — Other Ambulatory Visit: Payer: Self-pay | Admitting: Licensed Clinical Social Worker

## 2023-09-20 NOTE — Patient Instructions (Signed)
 Visit Information  Thank you for taking time to visit with me today. Please don't hesitate to contact me if I can be of assistance to you before our next scheduled appointment.  Your next care management appointment is by telephone on 07/29 at 10:30 AM  Please call the care guide team at [phone] if you need to cancel, schedule, or reschedule an appointment.   Please call the Suicide and Crisis Lifeline: 988 go to [facility] [address], [place] [phone]) call 911 if you are experiencing a Mental Health or Behavioral Health Crisis or need someone to talk to.  Alease Hunter, LCSW [place]  [facility], [facility] Clinical Social Worker Direct Dial: [phone]  Fax: [phone] Website: [email or web address] 3:29 PM

## 2023-09-20 NOTE — Patient Outreach (Signed)
 Complex Care Management   Visit Note  09/20/2023  Name:  Leslie Salazar MRN: [id number] DOB: Sep 06, 1965  Situation: Referral received for Complex Care Management related to Caregiver Stress I obtained verbal consent from Patient.  Visit completed with pt  on the phone  Background:   Past Medical History:  Diagnosis Date   Acne    Cancer (HCC) 2012   RIGHT DUCTAL CARCINOMA INSITU.Aaron Aas   Complication of anesthesia    Condyloma    ON BUTTOCKS   DCIS (ductal carcinoma in situ)    BRCA 1and 2 Neg./Pos ER, Pos PR   Fibroids    Heart palpitations    High cholesterol    History of angina    HSV-1 (herpes simplex virus 1) infection    Hx of radiation therapy 2013   from april to may - six weeks   Hypertension    Left ovarian cyst    PONV (postoperative nausea and vomiting)    nausea no vomiting   Scoliosis    Sleep apnea    c- pap nightly   Urinary urgency    Wears glasses     Assessment: Patient Reported Symptoms:  Cognitive Cognitive Status: Alert and oriented to person, place, and time, Normal speech and language skills Cognitive/Intellectual Conditions Management [RPT]: None reported or documented in medical history or problem list      Neurological Neurological Review of Symptoms: No symptoms reported    HEENT HEENT Symptoms Reported: No symptoms reported      Cardiovascular Cardiovascular Symptoms Reported: No symptoms reported    Respiratory Respiratory Symptoms Reported: No symptoms reported    Endocrine Patient reports the following symptoms related to hypoglycemia or hyperglycemia : No symptoms reported Is patient diabetic?: No    Gastrointestinal Gastrointestinal Symptoms Reported: No symptoms reported      Genitourinary Genitourinary Symptoms Reported: No symptoms reported    Integumentary Integumentary Symptoms Reported: No symptoms reported    Musculoskeletal Musculoskelatal Symptoms Reviewed: No symptoms reported        Psychosocial  Psychosocial Symptoms Reported: Other Other Psychosocial Conditions: Caregiver Stress Additional Psychological Details: Triggers to caregiver burnout identified and healthy coping skills, in addition, to local resources that pt is collaborating with to strengthen support. Validation and encouragement provided. Self care encouraged Behavioral Health Conditions: Other Other Behavorial Health Conditions: Caregiver Stress Behavioral Management Strategies: Adequate rest, Coping strategies, Support system Major Change/Loss/Stressor/Fears (CP): Medical condition, family Techniques to [place] with Loss/Stress/Change: Diversional activities Quality of Family Relationships: helpful, involved, supportive Do you feel physically threatened by others?: No      09/19/2023    1:33 PM  Depression screen PHQ 2/9  Decreased Interest 0  Down, Depressed, Hopeless 0  PHQ - 2 Score 0    There were no vitals filed for this visit.  Medications Reviewed Today     Reviewed by Adriana Albany, LCSW (Social Worker) on 09/20/23 at 1522  Med List Status: <None>   Medication Order Taking? Sig Documenting Provider Last Dose Status Informant  aspirin  EC 81 MG tablet [id number] No Take 81 mg by mouth every morning. [provider] Taking Active Self  atorvastatin  (LIPITOR) 20 MG tablet [id number] No TAKE 1 TABLET(20 MG) BY MOUTH DAILY Cleave Curling, MD Taking Active   Coenzyme Q10 100 MG capsule [id number] No Take 1 capsule by mouth daily. [provider] Taking Active Self           Med Note Nadean August   Thu May 22, 2015  8:21 AM) Received from: [facility] Received Sig: Take by mouth.  fluticasone (FLONASE) 50 MCG/ACT nasal spray [id number] No 1 SPRAY IN EACH NOSTRIL AT BEDTIME.     MAY REPEAT 12 HRS LATER AS NEEDED [provider] Taking Active   hydrochlorothiazide  (MICROZIDE ) 12.5 MG capsule [id number] No TAKE 1 CAPSULE(12.5 MG) BY MOUTH DAILY Cleave Curling, MD  Taking Active   metoprolol  succinate (TOPROL -XL) 25 MG 24 hr tablet [id number] No TAKE 1 TABLET(25 MG) BY MOUTH DAILY Cleave Curling, MD Taking Active   montelukast  (SINGULAIR ) 10 MG tablet [id number] No TAKE 1 TABLET(10 MG) BY MOUTH DAILY Cleave Curling, MD Taking Active   Multiple Vitamins-Minerals (MULTIVITAMIN WOMENS 50+ ADV PO) [id number] No Take by mouth. [provider] Taking Active Self  Omega-3 Fatty Acids (FISH OIL PO) [id number] No Take by mouth. 1 capsule daily [provider] Taking Active Self  traZODone  (DESYREL ) 50 MG tablet [id number] No TAKE 1 TABLET(50 MG) BY MOUTH AT BEDTIME AS NEEDED FOR SLEEP Cleave Curling, MD Taking Active   valACYclovir  (VALTREX ) 500 MG tablet [id number] No One tab po dailyprn Cleave Curling, MD Taking Active             Recommendation:   Continue Current Plan of Care  Follow Up Plan:   Telephone follow-up in 1 month  Alease Hunter, LCSW [facility]  [facility], [facility] Clinical Social Worker Direct Dial: [phone]  Fax: [phone] Website: [email or web address] 3:28 PM

## 2023-09-21 ENCOUNTER — Ambulatory Visit: Payer: Self-pay | Admitting: Obstetrics and Gynecology

## 2023-09-21 LAB — CYTOLOGY - PAP: Diagnosis: NEGATIVE

## 2023-09-26 DIAGNOSIS — G4733 Obstructive sleep apnea (adult) (pediatric): Secondary | ICD-10-CM | POA: Diagnosis not present

## 2023-10-01 DIAGNOSIS — G4733 Obstructive sleep apnea (adult) (pediatric): Secondary | ICD-10-CM | POA: Diagnosis not present

## 2023-10-12 ENCOUNTER — Ambulatory Visit (INDEPENDENT_AMBULATORY_CARE_PROVIDER_SITE_OTHER): Payer: Federal, State, Local not specified - PPO | Admitting: Internal Medicine

## 2023-10-12 ENCOUNTER — Encounter: Payer: Self-pay | Admitting: Internal Medicine

## 2023-10-12 VITALS — BP 110/70 | HR 64 | Temp 98.7°F | Ht 63.0 in | Wt 145.6 lb

## 2023-10-12 DIAGNOSIS — I10 Essential (primary) hypertension: Secondary | ICD-10-CM

## 2023-10-12 DIAGNOSIS — Z1159 Encounter for screening for other viral diseases: Secondary | ICD-10-CM | POA: Diagnosis not present

## 2023-10-12 DIAGNOSIS — Z0000 Encounter for general adult medical examination without abnormal findings: Secondary | ICD-10-CM | POA: Diagnosis not present

## 2023-10-12 DIAGNOSIS — R0982 Postnasal drip: Secondary | ICD-10-CM | POA: Diagnosis not present

## 2023-10-12 DIAGNOSIS — G4733 Obstructive sleep apnea (adult) (pediatric): Secondary | ICD-10-CM

## 2023-10-12 DIAGNOSIS — E7800 Pure hypercholesterolemia, unspecified: Secondary | ICD-10-CM

## 2023-10-12 DIAGNOSIS — J309 Allergic rhinitis, unspecified: Secondary | ICD-10-CM | POA: Diagnosis not present

## 2023-10-12 LAB — POCT URINALYSIS DIP (CLINITEK)
Bilirubin, UA: NEGATIVE
Blood, UA: NEGATIVE
Glucose, UA: NEGATIVE mg/dL
Ketones, POC UA: NEGATIVE mg/dL
Leukocytes, UA: NEGATIVE
Nitrite, UA: NEGATIVE
POC PROTEIN,UA: NEGATIVE
Spec Grav, UA: 1.025 (ref 1.010–1.025)
Urobilinogen, UA: 0.2 U/dL
pH, UA: 6 (ref 5.0–8.0)

## 2023-10-12 MED ORDER — ATORVASTATIN CALCIUM 20 MG PO TABS: ORAL_TABLET | ORAL | 2 refills | Status: DC

## 2023-10-12 MED ORDER — HYDROCHLOROTHIAZIDE 12.5 MG PO CAPS: ORAL_CAPSULE | ORAL | 1 refills | Status: DC

## 2023-10-12 NOTE — Assessment & Plan Note (Signed)
 Chronic, well controlled. EKG performed, NSR w/ voltage criteria for LVH, anteroseptal infarct, nonspecific T-abnormality, anterolateral ischemia. She will continue with hydrochlorothiazide  12.5mg  daily and metoprolol  succinate XL 25mg  daily. No med changes today. She will rto in six months for re-evaluation.

## 2023-10-12 NOTE — Assessment & Plan Note (Signed)
 A full exam was performed.  She is s/p bilateral mastectomy.   She is advised to get 30-45 minutes of regular exercise, no less than four to five days per week. Both weight-bearing and aerobic exercises are recommended.  She is advised to follow a healthy diet with at least six fruits/veggies per day, decrease intake of red meat and other saturated fats and to increase fish intake to twice weekly.  Meats/fish should not be fried -- baked, boiled or broiled is preferable. It is also important to cut back on your sugar intake.  Be sure to read labels - try to avoid anything with added sugar, high fructose corn syrup or other sweeteners.  If you must use a sweetener, you can try stevia or monkfruit.  It is also important to avoid artificially sweetened foods/beverages and diet drinks. Lastly, wear SPF 50 sunscreen on exposed skin and when in direct sunlight for an extended period of time.  Be sure to avoid fast food restaurants and aim for at least 60 ounces of water daily.

## 2023-10-12 NOTE — Patient Instructions (Signed)

## 2023-10-12 NOTE — Progress Notes (Signed)
 I,Victoria T Emmitt, CMA,acting as a Neurosurgeon for Catheryn LOISE Slocumb, MD.,have documented all relevant documentation on the behalf of Catheryn LOISE Slocumb, MD,as directed by  Catheryn LOISE Slocumb, MD while in the presence of Catheryn LOISE Slocumb, MD.  Subjective:    Patient ID: Leslie Salazar , female    DOB: [birth year] , 58 y.o.   MRN: [id number]  Chief Complaint  Patient presents with   Annual Exam    Patient presents today for annual exam. She reports compliance with medications. Denies headache, chest pain & sob. GYN: [facility].      Hypertension   Hyperlipidemia    HPI Discussed the use of AI scribe software for clinical note transcription with the patient, who gave verbal consent to proceed.  History of Present Illness The patient presents for a physical exam and blood pressure check.  She has experienced sinus drainage for approximately four weeks, which has shown improvement. Initially, she thought it was a cold, but she did not develop severe symptoms such as a stuffy nose or significant coughing. Her voice has been intermittently affected, going 'in and out,' and she lost her voice at one point. She has been using Singulair .  No significant coughing or stuffy nose is present.  She has a history of hypertension and hyperlipidemia.  She is currently taking atorvastatin , metoprolol , and hydrochlorothiazide . Her blood pressure has improved since she stopped eating out for lunch and started meal prepping. She has lost 10-15 pounds and feels much better, attributing her weight loss to increased exercise and dietary changes, including using frozen healthy choice meals and monitoring her protein intake.  She has sleep apnea and uses a CPAP machine, which she cleans weekly. She has a travel-sized CPAP for convenience. She is not currently using trazodone  for sleep as she is managing well with the CPAP.  Her father, who has Alzheimer's disease, severe glaucoma, and hypertension, was recently placed  in a nursing home. He has lumbar and cervical stenosis with a spinal cord injury, likely from past falls. He uses a walker and wheelchair, struggles with hand sensation, and has difficulty eating, though he manages with adaptive utensils. He is undergoing physical and occupational therapy.   Hypertension This is a chronic problem. The current episode started more than 1 year ago. The problem has been rapidly improving since onset. The problem is controlled. Pertinent negatives include no blurred vision or orthopnea. Risk factors for coronary artery disease include post-menopausal state. Past treatments include diuretics and beta blockers. The current treatment provides moderate improvement. There are no compliance problems.      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, Pos PR   Fibroids    Heart palpitations    High cholesterol    History of angina    HSV-1 (herpes simplex virus 1) infection    Hx of radiation therapy 2013   from april to may - six weeks   Hypertension    Left ovarian cyst    PONV (postoperative nausea and vomiting)    nausea no vomiting   Scoliosis    Sleep apnea    c- pap nightly   Urinary urgency    Wears glasses      Family History  Problem Relation Age  of Onset   Diabetes Mother        HAS HAD A KIDNEY TRANSPLATN   Hypertension Mother    Kidney failure Mother    Hypertension Father    Gallstones Father    Hypertension Sister    Gallstones Sister    Hypertension Sister    Colon cancer Paternal Aunt    Lung cancer Maternal Grandfather      Current Outpatient Medications:    aspirin  EC 81 MG tablet, Take 81 mg by mouth every morning., Disp: , Rfl:    Coenzyme Q10 100 MG capsule, Take 1 capsule by mouth daily., Disp: , Rfl:    metoprolol  succinate (TOPROL -XL) 25 MG 24 hr  tablet, TAKE 1 TABLET(25 MG) BY MOUTH DAILY, Disp: 90 tablet, Rfl: 2   montelukast  (SINGULAIR ) 10 MG tablet, TAKE 1 TABLET(10 MG) BY MOUTH DAILY, Disp: 90 tablet, Rfl: 2   Multiple Vitamins-Minerals (MULTIVITAMIN WOMENS 50+ ADV PO), Take by mouth., Disp: , Rfl:    Omega-3 Fatty Acids (FISH OIL PO), Take by mouth. 1 capsule daily, Disp: , Rfl:    valACYclovir  (VALTREX ) 500 MG tablet, One tab po dailyprn, Disp: 90 tablet, Rfl: 1   atorvastatin  (LIPITOR) 20 MG tablet, TAKE 1 TABLET(20 MG) BY MOUTH DAILY, Disp: 90 tablet, Rfl: 2   hydrochlorothiazide  (MICROZIDE ) 12.5 MG capsule, TAKE 1 CAPSULE(12.5 MG) BY MOUTH DAILY, Disp: 90 capsule, Rfl: 1   No Known Allergies    The patient states she uses none for birth control. No LMP recorded. Patient is postmenopausal.. Negative for Dysmenorrhea. Negative for: breast discharge, breast lump(s), breast pain and breast self exam. Associated symptoms include abnormal vaginal bleeding. Pertinent negatives include abnormal bleeding (hematology), anxiety, decreased libido, depression, difficulty falling sleep, dyspareunia, history of infertility, nocturia, sexual dysfunction, sleep moderate.disturbances, urinary incontinence, urinary urgency, vaginal discharge and vaginal itching. Diet regular.The patient states her exercise level is  moderate.  . The patient's tobacco use is:  Social History   Tobacco Use  Smoking Status Never  Smokeless Tobacco Never  . She has been exposed to passive smoke. The patient's alcohol use is:  Social History   Substance and Sexual Activity  Alcohol Use Yes   Alcohol/week: 1.0 standard drink of alcohol   Types: 1 Glasses of wine per week   Comment: social    Review of Systems  Constitutional: Negative.   HENT:  Positive for postnasal drip.   Eyes: Negative.  Negative for blurred vision.  Respiratory: Negative.    Cardiovascular: Negative.  Negative for orthopnea.  Gastrointestinal: Negative.   Endocrine: Negative.    Genitourinary: Negative.   Musculoskeletal: Negative.   Skin: Negative.   Allergic/Immunologic: Negative.   Neurological: Negative.   Hematological: Negative.   Psychiatric/Behavioral: Negative.       Today's Vitals   10/12/23 1001  BP: 110/70  Pulse: 64  Temp: 98.7 F (37.1 C)  SpO2: 98%  Weight: 145 lb 9.6 oz (66 kg)  Height: 5' 3 (1.6 m)   Body mass index is 25.79 kg/m.  Wt Readings from Last 3 Encounters:  10/12/23 145 lb 9.6 oz (66 kg)  09/19/23 147 lb (66.7 kg)  05/09/23 154 lb (69.9 kg)     Objective:  Physical Exam Vitals and nursing note reviewed.  Constitutional:      Appearance: Normal appearance.  HENT:     Head: Normocephalic and atraumatic.     Right Ear: Tympanic membrane, ear canal and external ear normal.     Left Ear: Tympanic  membrane, ear canal and external ear normal.     Nose: Nose normal.     Mouth/Throat:     Mouth: Mucous membranes are moist.     Pharynx: Oropharynx is clear. Posterior oropharyngeal erythema present.  Eyes:     Extraocular Movements: Extraocular movements intact.     Conjunctiva/sclera: Conjunctivae normal.     Pupils: Pupils are equal, round, and reactive to light.  Cardiovascular:     Rate and Rhythm: Normal rate and regular rhythm.     Pulses: Normal pulses.     Heart sounds: Normal heart sounds.  Pulmonary:     Effort: Pulmonary effort is normal.     Breath sounds: Normal breath sounds.  Chest:  Breasts:    Right: Absent.     Left: Absent.     Comments: S/p b/l mastectomy w/ reconstructive implant surgery. Tattoos b/l breasts No axillary adenopathy Abdominal:     General: Abdomen is flat. Bowel sounds are normal.     Palpations: Abdomen is soft.  Genitourinary:    Comments: deferred Musculoskeletal:        General: Normal range of motion.     Cervical back: Normal range of motion and neck supple.  Skin:    General: Skin is warm and dry.     Comments: Hyperpigmented papular lesion left jaw   Neurological:     General: No focal deficit present.     Mental Status: She is alert and oriented to person, place, and time.  Psychiatric:        Mood and Affect: Mood normal.        Behavior: Behavior normal.         Assessment And Plan:     Encounter for general adult medical examination w/o abnormal findings Assessment & Plan: A full exam was performed.  She is s/p bilateral mastectomy.   She is advised to get 30-45 minutes of regular exercise, no less than four to five days per week. Both weight-bearing and aerobic exercises are recommended.  She is advised to follow a healthy diet with at least six fruits/veggies per day, decrease intake of red meat and other saturated fats and to increase fish intake to twice weekly.  Meats/fish should not be fried -- baked, boiled or broiled is preferable. It is also important to cut back on your sugar intake.  Be sure to read labels - try to avoid anything with added sugar, high fructose corn syrup or other sweeteners.  If you must use a sweetener, you can try stevia or monkfruit.  It is also important to avoid artificially sweetened foods/beverages and diet drinks. Lastly, wear SPF 50 sunscreen on exposed skin and when in direct sunlight for an extended period of time.  Be sure to avoid fast food restaurants and aim for at least 60 ounces of water daily.         Orders: -     CBC -     CMP14+EGFR -     Lipid panel  Essential hypertension, benign Assessment & Plan: Chronic, well controlled. EKG performed, NSR w/ voltage criteria for LVH, anteroseptal infarct, nonspecific T-abnormality, anterolateral ischemia. She will continue with hydrochlorothiazide  12.5mg  daily and metoprolol  succinate XL 25mg  daily. No med changes today. She will rto in six months for re-evaluation.   Orders: -     POCT URINALYSIS DIP (CLINITEK) -     Microalbumin / creatinine urine ratio -     EKG 12-Lead  Pure hypercholesterolemia Assessment & Plan:  Chronic, she  will continue with atorvastatin  20mg  daily.  She has had calcium  scoring, results are ZERO.   -Continue to follow heart healthy lifestyle,she was commended on her recent lifestyle changes   Allergic rhinitis with postnasal drip Assessment & Plan: Chronic sinus drainage with intermittent voice changes and mild cough likely due to post-nasal drip. Symptoms present.  - Advise taking Zyrtec at night due to potential drowsiness. - Discontinue Flonase.   Need for hepatitis B screening test -     Hepatitis B surface antibody,qualitative  OSA on CPAP Assessment & Plan: Sleep apnea managed with CPAP therapy. Good compliance and improved sleep quality. Discussed Aspire implantable device, but she is hesitant due to mixed reviews and potential complications. - Continue CPAP therapy.   Other orders -     Atorvastatin  Calcium ; TAKE 1 TABLET(20 MG) BY MOUTH DAILY  Dispense: 90 tablet; Refill: 2 -     hydroCHLOROthiazide ; TAKE 1 CAPSULE(12.5 MG) BY MOUTH DAILY  Dispense: 90 capsule; Refill: 1   Return for 1 YEAR HM, 6 MONTH CHOL & BP F/U. SABRA Patient was given opportunity to ask questions. Patient verbalized understanding of the plan and was able to repeat key elements of the plan. All questions were answered to their satisfaction.   I, Catheryn LOISE Slocumb, MD, have reviewed all documentation for this visit. The documentation on 10/12/23 for the exam, diagnosis, procedures, and orders are all accurate and complete.

## 2023-10-13 LAB — CBC
Hematocrit: 43.5 % (ref 34.0–46.6)
Hemoglobin: 14.4 g/dL (ref 11.1–15.9)
MCH: 31.1 pg (ref 26.6–33.0)
MCHC: 33.1 g/dL (ref 31.5–35.7)
MCV: 94 fL (ref 79–97)
Platelets: 274 10*3/uL (ref 150–450)
RBC: 4.63 x10E6/uL (ref 3.77–5.28)
RDW: 13 % (ref 11.7–15.4)
WBC: 5.8 10*3/uL (ref 3.4–10.8)

## 2023-10-13 LAB — CMP14+EGFR
ALT: 37 IU/L — ABNORMAL HIGH (ref 0–32)
AST: 21 IU/L (ref 0–40)
Albumin: 4.8 g/dL (ref 3.8–4.9)
Alkaline Phosphatase: 84 IU/L (ref 44–121)
BUN/Creatinine Ratio: 21 (ref 9–23)
BUN: 16 mg/dL (ref 6–24)
Bilirubin Total: 1 mg/dL (ref 0.0–1.2)
CO2: 24 mmol/L (ref 20–29)
Calcium: 10.2 mg/dL (ref 8.7–10.2)
Chloride: 101 mmol/L (ref 96–106)
Creatinine, Ser: 0.75 mg/dL (ref 0.57–1.00)
Globulin, Total: 2.5 g/dL (ref 1.5–4.5)
Glucose: 79 mg/dL (ref 70–99)
Potassium: 4.4 mmol/L (ref 3.5–5.2)
Sodium: 141 mmol/L (ref 134–144)
Total Protein: 7.3 g/dL (ref 6.0–8.5)
eGFR: 93 mL/min/{1.73_m2} (ref >59)

## 2023-10-13 LAB — LIPID PANEL
Chol/HDL Ratio: 2.3 ratio (ref 0.0–4.4)
Cholesterol, Total: 160 mg/dL (ref 100–199)
HDL: 69 mg/dL (ref >39)
LDL Chol Calc (NIH): 75 mg/dL (ref 0–99)
Triglycerides: 87 mg/dL (ref 0–149)
VLDL Cholesterol Cal: 16 mg/dL (ref 5–40)

## 2023-10-13 LAB — HEPATITIS B SURFACE ANTIBODY,QUALITATIVE: Hep B Surface Ab, Qual: REACTIVE

## 2023-10-13 LAB — MICROALBUMIN / CREATININE URINE RATIO
Creatinine, Urine: 105.3 mg/dL
Microalb/Creat Ratio: 6 mg/g{creat} (ref 0–29)
Microalbumin, Urine: 6 ug/mL

## 2023-10-15 ENCOUNTER — Ambulatory Visit: Payer: Self-pay | Admitting: Internal Medicine

## 2023-10-23 DIAGNOSIS — J309 Allergic rhinitis, unspecified: Secondary | ICD-10-CM | POA: Insufficient documentation

## 2023-10-23 NOTE — Assessment & Plan Note (Addendum)
 Chronic, she will continue with atorvastatin  20mg  daily.  She has had calcium  scoring, results are ZERO.   -Continue to follow heart healthy lifestyle,she was commended on her recent lifestyle changes

## 2023-10-23 NOTE — Assessment & Plan Note (Signed)
 Chronic sinus drainage with intermittent voice changes and mild cough likely due to post-nasal drip. Symptoms present.  - Advise taking Zyrtec at night due to potential drowsiness. - Discontinue Flonase.

## 2023-10-23 NOTE — Assessment & Plan Note (Signed)
 Sleep apnea managed with CPAP therapy. Good compliance and improved sleep quality. Discussed Aspire implantable device, but she is hesitant due to mixed reviews and potential complications. - Continue CPAP therapy.

## 2023-10-26 DIAGNOSIS — G4733 Obstructive sleep apnea (adult) (pediatric): Secondary | ICD-10-CM | POA: Diagnosis not present

## 2023-11-08 ENCOUNTER — Telehealth: Admitting: Licensed Clinical Social Worker

## 2023-11-17 ENCOUNTER — Other Ambulatory Visit: Payer: Self-pay | Admitting: Licensed Clinical Social Worker

## 2023-11-21 NOTE — Patient Outreach (Signed)
 Complex Care Management   Visit Note  11/17/2023  Name:  Leslie Salazar MRN: [id number] DOB: 04-25-1965  Situation: Referral received for Complex Care Management related to Caregiver Stress I obtained verbal consent from Patient.  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, Pos PR   Fibroids    Heart palpitations    High cholesterol    History of angina    HSV-1 (herpes simplex virus 1) infection    Hx of radiation therapy 2013   from april to may - six weeks   Hypertension    Left ovarian cyst    PONV (postoperative nausea and vomiting)    nausea no vomiting   Scoliosis    Sleep apnea    c- pap nightly   Urinary urgency    Wears glasses     Assessment: Patient Reported Symptoms:  Cognitive Cognitive Status: Alert and oriented to person, place, and time, No symptoms reported, Normal speech and language skills Cognitive/Intellectual Conditions Management [RPT]: None reported or documented in medical history or problem list      Neurological Neurological Review of Symptoms: No symptoms reported    HEENT HEENT Symptoms Reported: No symptoms reported      Cardiovascular Cardiovascular Symptoms Reported: Not assessed    Respiratory Respiratory Symptoms Reported: Not assesed    Endocrine Endocrine Symptoms Reported: Not assessed    Gastrointestinal Gastrointestinal Symptoms Reported: Not assessed      Genitourinary Genitourinary Symptoms Reported: Not assessed    Integumentary Integumentary Symptoms Reported: Not assessed    Musculoskeletal Musculoskelatal Symptoms Reviewed: Not assessed        Psychosocial Psychosocial Symptoms Reported: Other Other Psychosocial Conditions: Caregiver Stress Additional Psychological Details:  Stress has decreased after father was placed at [facility] noting he was recently awarded [employer]. Behavioral Management Strategies: Adequate rest, Coping strategies, Support system Major Change/Loss/Stressor/Fears (CP): Medical condition, family Techniques to Cope with Loss/Stress/Change: Diversional activities        10/12/2023   10:02 AM  Depression screen PHQ 2/9  Decreased Interest 0  Down, Depressed, Hopeless 0  PHQ - 2 Score 0  Altered sleeping 0  Tired, decreased energy 0  Change in appetite 0  Feeling bad or failure about yourself  0  Trouble concentrating 0  Moving slowly or fidgety/restless 0  Suicidal thoughts 0  PHQ-9 Score 0  Difficult doing work/chores Not difficult at all    There were no vitals filed for this visit.  Medications Reviewed Today     Reviewed by Ezzard Rolin JONETTA, LCSW (Social Worker) on 11/17/23 at 1336  Med List Status: <None>   Medication Order Taking? Sig Documenting Provider Last Dose Status Informant  aspirin  EC 81 MG tablet [id number]  Take 81 mg by mouth every morning. [provider]  Active Self  atorvastatin  (LIPITOR) 20 MG tablet [id number]  TAKE 1 TABLET(20 MG) BY MOUTH DAILY Jarold Medici, MD  Active   Coenzyme Q10 100 MG capsule [id number]  Take 1 capsule by mouth daily. [provider]  Active Self           Med Note HUMBERTO GRIMES   Thu May 22, 2015  8:21 AM) Received  from: [facility] Received Sig: Take by mouth.  hydrochlorothiazide  (MICROZIDE ) 12.5 MG capsule [id number]  TAKE 1 CAPSULE(12.5 MG) BY MOUTH DAILY Jarold Medici, MD  Active   metoprolol  succinate (TOPROL -XL) 25 MG 24 hr tablet [id number]  TAKE 1 TABLET(25 MG) BY MOUTH DAILY Jarold Medici, MD  Active   montelukast  (SINGULAIR ) 10 MG tablet [id number]  TAKE 1 TABLET(10 MG) BY MOUTH DAILY Jarold Medici, MD  Active   Multiple Vitamins-Minerals (MULTIVITAMIN WOMENS 50+ ADV PO) [id number]  Take by mouth. [provider]   Active Self  Omega-3 Fatty Acids (FISH OIL PO) [id number]  Take by mouth. 1 capsule daily [provider]  Active Self  valACYclovir  (VALTREX ) 500 MG tablet [id number]  One tab po dailyprn Jarold Medici, MD  Active             Recommendation:   Continue Current Plan of Care  Follow Up Plan:   Closing From:  Complex Care Management  Rolin Kerns, LCSW [place]  [facility], [facility] Clinical Social Worker Direct Dial: [phone]  Fax: [phone] Website: [email or web address] 6:19 AM

## 2023-11-21 NOTE — Patient Instructions (Signed)
 Visit Information  Thank you for taking time to visit with me today. Please don't hesitate to contact me if I can be of assistance to you before our next scheduled appointment.   Closing From: Complex Care Management.  Please call the care guide team at [phone] if you need to cancel, schedule, or reschedule an appointment.   Please call the Suicide and Crisis Lifeline: 988 go to [facility] [address], [place] [phone]) call 911 if you are experiencing a Mental Health or Behavioral Health Crisis or need someone to talk to.  Rolin Kerns, LCSW [place]  [facility], [facility] Clinical Social Worker Direct Dial: [phone]  Fax: [phone] Website: [email or web address] 6:20 AM

## 2023-11-26 DIAGNOSIS — G4733 Obstructive sleep apnea (adult) (pediatric): Secondary | ICD-10-CM | POA: Diagnosis not present

## 2023-12-21 ENCOUNTER — Ambulatory Visit

## 2023-12-21 DIAGNOSIS — Z01419 Encounter for gynecological examination (general) (routine) without abnormal findings: Secondary | ICD-10-CM

## 2023-12-21 DIAGNOSIS — E2839 Other primary ovarian failure: Secondary | ICD-10-CM | POA: Diagnosis not present

## 2023-12-21 DIAGNOSIS — M8588 Other specified disorders of bone density and structure, other site: Secondary | ICD-10-CM | POA: Diagnosis not present

## 2023-12-21 DIAGNOSIS — Z780 Asymptomatic menopausal state: Secondary | ICD-10-CM | POA: Diagnosis not present

## 2023-12-22 ENCOUNTER — Encounter: Payer: Self-pay | Admitting: Obstetrics and Gynecology

## 2023-12-22 ENCOUNTER — Encounter: Payer: Self-pay | Admitting: Internal Medicine

## 2023-12-22 LAB — HM DEXA SCAN

## 2024-01-01 DIAGNOSIS — G4733 Obstructive sleep apnea (adult) (pediatric): Secondary | ICD-10-CM | POA: Diagnosis not present

## 2024-01-11 ENCOUNTER — Other Ambulatory Visit: Payer: Self-pay | Admitting: Internal Medicine

## 2024-01-21 ENCOUNTER — Other Ambulatory Visit: Payer: Self-pay | Admitting: Internal Medicine

## 2024-01-25 DIAGNOSIS — G4733 Obstructive sleep apnea (adult) (pediatric): Secondary | ICD-10-CM | POA: Diagnosis not present

## 2024-01-25 NOTE — Progress Notes (Unsigned)
 SABRA

## 2024-01-26 ENCOUNTER — Ambulatory Visit (INDEPENDENT_AMBULATORY_CARE_PROVIDER_SITE_OTHER): Payer: Federal, State, Local not specified - PPO | Admitting: Neurology

## 2024-01-26 ENCOUNTER — Encounter: Payer: Self-pay | Admitting: Neurology

## 2024-01-26 VITALS — BP 120/84 | HR 72 | Ht 62.0 in | Wt 150.0 lb

## 2024-01-26 DIAGNOSIS — R0982 Postnasal drip: Secondary | ICD-10-CM | POA: Diagnosis not present

## 2024-01-26 DIAGNOSIS — G4733 Obstructive sleep apnea (adult) (pediatric): Secondary | ICD-10-CM | POA: Diagnosis not present

## 2024-01-26 DIAGNOSIS — J309 Allergic rhinitis, unspecified: Secondary | ICD-10-CM | POA: Diagnosis not present

## 2024-01-26 DIAGNOSIS — M2619 Other specified anomalies of jaw-cranial base relationship: Secondary | ICD-10-CM

## 2024-01-26 NOTE — Progress Notes (Signed)
 Provider:  Dedra Gores, MD  Primary Care Physician:  Jarold Medici, MD [address] [place] [address]     Referring Provider: Jarold Medici, Md [address] [place],  [place] [address]          Chief Complaint according to patient   Patient presents with:                HISTORY OF PRESENT ILLNESS:  Leslie Salazar is a 58 y.o. female patient who is here for revisit 01/26/2024 for  CPAP follow up, she had also noted benefit in terms of sleepiness, fatigue, travel CPAP was also purchased.   .    Chief concern according to patient :  I have a dry mouth and would like to consider a different mask. I like to try a nasal pillow or nasal mask.  I enjoy the CPAP otherwise - in general happy with CPAP.  Review of Systems: Out of a complete 14 system review, the patient complains of only the following symptoms, and all other reviewed systems are negative.:   SLEEPINESS ?  How likely are you to doze in the following situations: 0 = not likely, 1 = slight chance, 2 = moderate chance, 3 = high chance  Sitting and Reading? Watching Television? Sitting inactive in a public place (theater or meeting)? Lying down in the afternoon when circumstances permit? Sitting and talking to someone? Sitting quietly after lunch without alcohol? In a car, while stopped for a few minutes in traffic? As a passenger in a car for an hour without a break?  Total =  7/ 24  FSS at 26/ 63   Lost weight, now her BMI is 27 and 150 lbs. That also affect her AHI positively.           Her Epworth sleepiness score is currently endorsed at 8 out of 24 points and prior to using CPAP it was 15 out of 24 points.  Her fatigue severity score is still elevated at 43 but she does have other immune compromising disorders diseases etc.  Her compliance is excellent 100% by days and 100% by hours with an average of 6 hours 48 minutes each night.  Her setting are  between 4 and 16 cm water and for 95% of the night she will use a pressure of 7.2 cm water which is not high.  Her residual AHI is 0.2/h.  This is an excellent resolution!!   We talked about  CPAP versus inspire , who is a good candidate ( she is ) but also that Inspire patients will  undergo 2 times anesthesia,  have no resolution of hypoxia, and will be undergoing several sleep studies to titrate he device in VOLTAGE output. p[place] down at the airport as they cannot go through regular magnetome. She has been a great patient and had  clearly clinical benefit.   She may consider a dental device instead.       Leslie Salazar is a 58 y.o.  African American female patient  was seen here in a RV on 12/03/2021.  She has undergone a sleep study and started CPAP therapy. Last seen by MM on 05-29-2021: She recently  traveled to [place], a wonderful trip, but she felt tired- her AHI was well controlled.    She has ischemic heart disease, and Breast cancer, she reached out to disabled [place] Veterans: she had double mastectomy, reconstructive surgery  with latissimus flap. She remains with a restrictive ROM of the right upper extremity and chest wall.  Breast cancer Radiation added higher risk  of fibrosis.  She has trouble to find a comfortable sleep position.  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   Ischemic heart disease is another known risk  factor for OSA and CSA, and can manifest as fatigue after OSA is treated.  She had a positive stress test 2015 , and yearly since.  The patient achieved a 10% disability assignment in November 2015 based on workload of greater than 7 METS but not greater than 10 METS resulting in dyspnea, fatigue, angina, lightheadedness or  presyncope-syncope.  She has a left ventricular known dysfunction with an ejection fraction of more than 50%.  At the time of a higher evaluation of 30% was not warranted for arteriosclerotic heart disease but since then she had EKGs and echocardiograms that have shown progression of her ischemia. Her OSA is more than likely related to her service related ischemic heart disease and HTN.    The patient endorsed the Epworth sleepiness scale today at 14 points and she felt the sleepiness while watching television and less sleepy when sitting and driving or when stopped in traffic.   CPAP compliance was 90% 27 out of 30 days with an average use at time of 6 hours 31 minutes.  The minimum pressure is set at 6 and maximum pressure at 16 cm water pressure the patient's average pressure at night at the 95th percentile was 7.4 cm water.  Average air leak was 1.3 L/min.  The residual AHI was 0.2 the average central AHI was 0.1.  This is a very good reduction of the baseline sleep apnea with her which had reached just under 20 AHI.  I like for the patient to continue using her CPAP under the current settings and she seems to have achieved a good mask fit as there is no significant air leakage.     I would like her to follow-up through my nurse practitioner in 6-8 months when she has news from the [place].   05/29/21: Primary Neurologist is Dr. Margaret, MD.   Chief concern according to patient :  non restorative sleep, worse since oophorectomy. Her Sister's have witnessed her snoring, and gasping in her sleep. Hot flushes, sleep headaches, palpitations.   11/27/2020. Her primary Neurologist is Dr. Margaret, MD.   Chief concern according to patient :  non restorative sleep, worse since oophorectomy. Her Sister's have witnessed her snoring, and gasping in her sleep. Hot flushes, sleep headaches, palpitations.    11-27-2020 : HST : Leslie Salazar  has a past medical history of Acne, breast Cancer (HCC) (2012),  mastectomy bilateral- 04/2011. Complication of anesthesia, History of angina, hx of radiation therapy (2013),Condyloma, DCIS (ductal carcinoma in situ), Fibroids, Heart palpitations, High cholesterol, , Hypertension, Left ovarian cyst, Scoliosis, Urinary urgency, and non restorative sleep. Ovariectomy, 2021-Medical menopause, 6.5 years on tamoxifen and related Weight gain.      Epworth sleepiness score:15 /24.   BMI: 31.6 kg/m   Neck Circumference: 15   The overall AHI( apnea hypopnea index) was calculated at 17.4/h during REM sleep much higher at 34.4/h and in non-REM sleep 11.8/h.  Positional AHI: In supine sleep the AHI was 18.9/h the RDI 21/h in nonsupine sleep AHI was 14.2/h and RDI 16.1/h.  The patient slept mainly in supine position with 349 minutes.        Social History   Socioeconomic History   Marital status: Legally Separated    Spouse name: Not on file   Number of children: 1   Years of education: Not on file   Highest education level: Master's degree (e.g., MA, MS, MEng, MEd, MSW, MBA)  Occupational History   Not on file  Tobacco Use   Smoking status: Never   Smokeless tobacco: Never  Vaping Use   Vaping status: Never Used  Substance and Sexual Activity   Alcohol use: Yes    Alcohol/week: 1.0 standard drink of alcohol    Types: 1 Glasses of wine per week    Comment: social   Drug use: No   Sexual activity: Not Currently    Birth control/protection: Surgical, Post-menopausal    Comment: 1ST INTERCOURSE- 74, PARTNERS- 5, btl & oophorectomy  Other Topics Concern   Not on file  Social History Narrative   1 cup of caffeine daily coffee in the morning    Social Drivers of Health   Financial Resource Strain: Low Risk  (05/06/2023)   Received from [facility]   Overall Financial Resource Strain (CARDIA)    Difficulty of Paying Living Expenses: Not hard at all  Food Insecurity: No Food Insecurity (07/26/2023)    Hunger Vital Sign    Worried About Running Out of Food in the Last Year: Never true    Ran Out of Food in the Last Year: Never true  Transportation Needs: No Transportation Needs (07/26/2023)   PRAPARE - Administrator, Civil Service (Medical): No    Lack of Transportation (Non-Medical): No  Physical Activity: Insufficiently Active (04/13/2023)   Exercise Vital Sign    Days of Exercise per Week: 2 days    Minutes of Exercise per Session: 30 min  Stress: Stress Concern Present (04/13/2023)   [employer] of Occupational Health - Occupational Stress Questionnaire    Feeling of Stress : To some extent  Social Connections: Moderately Isolated (04/13/2023)   Social Connection and Isolation Panel    Frequency of Communication with Friends and Family: Twice a week    Frequency of Social Gatherings with Friends and Family: Once a week    Attends Religious Services: 1 to 4 times per year    Active Member of [employer] or Organizations: No    Attends Engineer, structural: Not on file    Marital Status: Separated    Family History  Problem Relation Age of Onset   Diabetes Mother        HAS HAD A KIDNEY TRANSPLATN   Hypertension Mother    Kidney failure Mother    Hypertension Father    Gallstones Father    Hypertension Sister    Gallstones Sister    Hypertension Sister    Colon cancer Paternal Aunt    Lung cancer Maternal Grandfather     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, Pos PR   Fibroids    Heart palpitations    High cholesterol  History of angina    HSV-1 (herpes simplex virus 1) infection    Hx of radiation therapy 2013   from april to may - six weeks   Hypertension    Left ovarian cyst    Osteopenia    2025: -1.3 frax 6.1, 0.2%. repeat in 2 years   PONV (postoperative  nausea and vomiting)    nausea no vomiting   Scoliosis    Sleep apnea    c- pap nightly   Urinary urgency    Wears glasses     Past Surgical History:  Procedure Laterality Date   BREAST CAPSULECTOMY WITH IMPLANT EXCHANGE Right 06/27/2019   Procedure: release of capsular contracture with repositioning of the right implant with possible exchange of right breast implant;  Surgeon: Lowery Estefana RAMAN, DO;  Location: [facility];  Service: Plastics;  Laterality: Right;   BREAST RECONSTRUCTION WITH PLACEMENT OF TISSUE EXPANDER AND FLEX HD (ACELLULAR HYDRATED DERMIS) Right 11/16/2012   Procedure: RIGHT LATISSIMUS myocutaneous FLAP WITH PLACEMENT OF TISSUE EXPANDER  TO RIGHT BREAST;  Surgeon: Estefana Reichert, DO;  Location: [facility];  Service: Plastics;  Laterality: Right;   CAPSULECTOMY Bilateral 01/27/2023   Procedure: [id number];  Surgeon: Lowery Estefana RAMAN, DO;  Location: [facility];  Service: Plastics;  Laterality: Bilateral;   CARDIAC CATHETERIZATION N/A 02/28/2015   Procedure: Left Heart Cath and Coronary Angiography;  Surgeon: Gordy Bergamo, MD;  Location: [facility];  Service: Cardiovascular;  Laterality: N/A;   CESAREAN SECTION  2000   ENDOMETRIAL ABLATION W/ NOVASURE  2007   INSERTION OF TISSUE EXPANDER AFTER MASTECTOMY Bilateral 04/20/2011   Breast Ca (r) T1b N0 M0 stage IA infiltrating ductal carcinoma   LAPAROSCOPIC BILATERAL SALPINGO OOPHERECTOMY Bilateral 08/30/2018   Procedure: LAPAROSCOPIC BILATERAL SALPINGO OOPHORECTOMY w/ PERITONEAL WASHINGS;  Surgeon: Lavoie, Marie-Lyne, MD;  Location: [facility];  Service: Gynecology;  Laterality: Bilateral;   LASER ABLATION OF THE CERVIX  1994   FOR DYSPLASIA   LIPOSUCTION WITH LIPOFILLING Right 03/14/2013   Procedure: LIPOSUCTION WITH LIPOFILLING;  Surgeon: Estefana Reichert, DO;  Location: [facility];  Service: Plastics;  Laterality: Right;    LIPOSUCTION WITH LIPOFILLING Right 06/27/2019   Procedure: Fat grafting to right breast with excision of back excess skin;  Surgeon: Lowery Estefana RAMAN, DO;  Location: [facility];  Service: Plastics;  Laterality: Right;  2 hours, please   PLACEMENT OF BREAST IMPLANTS Bilateral 01/27/2023   Procedure: PLACEMENT OF BREAST IMPLANTS;  Surgeon: Lowery Estefana RAMAN, DO;  Location: [facility];  Service: Plastics;  Laterality: Bilateral;   RECONSTRUCTION BREAST W/ LATISSIMUS DORSI FLAP Right 11/16/2012   REMOVAL OF TISSUE EXPANDER AND PLACEMENT OF IMPLANT Right 03/14/2013   Procedure: REMOVAL OF RIGHT BREAST TISSUE EXPANDER WITH PLACEMENT OF RIGHT BREAST IMPLANT;  Surgeon: Estefana Reichert, DO;  Location: [facility];  Service: Plastics;  Laterality: Right;  Removal of Right Breast Tissue Expander with Placement of Right Breast Implant, Possible Lipsuction with Lipofilling   TISSUE EXPANDER PLACEMENT Right 11/16/2012   TISSUE EXPANDER REMOVAL Bilateral 12/2011   TUBAL LIGATION  09/08/2001   BY LAPAROSCOPY ,, HULKA CLIPS TECHNIQUE   WISDOM TOOTH EXTRACTION       Current Outpatient Medications on File Prior to Visit  Medication Sig Dispense Refill   aspirin  EC 81 MG tablet Take 81 mg by mouth every morning.     atorvastatin  (LIPITOR) 20 MG tablet TAKE 1 TABLET(20 MG) BY MOUTH  DAILY 90 tablet 2   CALCIUM  PO Take by mouth.     Coenzyme Q10 100 MG capsule Take 1 capsule by mouth daily.     hydrochlorothiazide  (MICROZIDE ) 12.5 MG capsule TAKE 1 CAPSULE(12.5 MG) BY MOUTH DAILY 90 capsule 1   metoprolol  succinate (TOPROL -XL) 25 MG 24 hr tablet TAKE 1 TABLET(25 MG) BY MOUTH DAILY 90 tablet 2   montelukast  (SINGULAIR ) 10 MG tablet TAKE 1 TABLET(10 MG) BY MOUTH DAILY 90 tablet 2   Multiple Vitamins-Minerals (MULTIVITAMIN WOMENS 50+ ADV PO) Take by mouth.     Omega-3 Fatty Acids (FISH OIL PO) Take by mouth. 1 capsule daily     valACYclovir  (VALTREX ) 500 MG tablet TAKE 1  TABLET BY MOUTH DAILY AS NEEDED 90 tablet 1   No current facility-administered medications on file prior to visit.    No Known Allergies   DIAGNOSTIC DATA (LABS, IMAGING, TESTING) - I reviewed patient records, labs, notes, testing and imaging myself where available.  Lab Results  Component Value Date   WBC 5.8 10/12/2023   HGB 14.4 10/12/2023   HCT 43.5 10/12/2023   MCV 94 10/12/2023   PLT 274 10/12/2023      Component Value Date/Time   NA 141 10/12/2023 1115   K 4.4 10/12/2023 1115   CL 101 10/12/2023 1115   CO2 24 10/12/2023 1115   GLUCOSE 79 10/12/2023 1115   GLUCOSE 89 01/24/2023 1153   BUN 16 10/12/2023 1115   CREATININE 0.75 10/12/2023 1115   CALCIUM  10.2 10/12/2023 1115   PROT 7.3 10/12/2023 1115   ALBUMIN  4.8 10/12/2023 1115   AST 21 10/12/2023 1115   ALT 37 (H) 10/12/2023 1115   ALKPHOS 84 10/12/2023 1115   BILITOT 1.0 10/12/2023 1115   GFRNONAA >60 01/24/2023 1153   GFRAA 90 03/12/2020 1528   Lab Results  Component Value Date   CHOL 160 10/12/2023   HDL 69 10/12/2023   LDLCALC 75 10/12/2023   TRIG 87 10/12/2023   CHOLHDL 2.3 10/12/2023   Lab Results  Component Value Date   HGBA1C 5.4 03/25/2021   Lab Results  Component Value Date   VITAMINB12 1,118 06/17/2020   Lab Results  Component Value Date   TSH 1.120 10/11/2022    PHYSICAL EXAM:  Vitals:   01/26/24 0928  BP: 120/84  Pulse: 72   No data found. Body mass index is 27.44 kg/m.   Wt Readings from Last 3 Encounters:  01/26/24 150 lb (68 kg)  10/12/23 145 lb 9.6 oz (66 kg)  09/19/23 147 lb (66.7 kg)     Ht Readings from Last 3 Encounters:  01/26/24 5' 2 (1.575 m)  10/12/23 5' 3 (1.6 m)  09/19/23 5' 3 (1.6 m)      General: The patient is awake, alert and appears not in acute distress and groomed. Head: Normocephalic, atraumatic.  Neck is supple.  Neck is supple. Mallampati 3  neck circumference:14.5 inches .   Nasal airflow  patent.  Retrognathia is present .  Dental  status: small lower jaw, scalloped tongue.  Cardiovascular:  Regular rate and cardiac rhythm by pulse,  without distended neck veins. Respiratory: Lungs are clear to auscultation.  Skin:  Without evidence of ankle edema, or rash. Trunk: The patient's posture is erect.   Neurologic exam : The patient is awake and alert, oriented to place and time.   Memory subjective described as intact.   Attention span & concentration ability appears normal.  Speech is fluent,  without  dysarthria, dysphonia or aphasia.  Mood and affect are appropriate.   Cranial nerves: no loss of smell or taste reported  Pupils are equal and briskly reactive to light.   Extraocular movements in vertical and horizontal planes were intact and without nystagmus. No Diplopia.  Facial motor strength is symmetric and tongue and uvula move midline.  Neck ROM : rotation, tilt and flexion extension were normal for age and shoulder shrug was symmetrical.    Motor exam:  Symmetric bulk, tone and ROM.   Normal tone without cog -wheeling, symmetric grip strength . Gait and Station: Normal.   ASSESSMENT AND PLAN :   58 y.o. year old female  here with:    1) OSA on CPAP, has good compliance and high degree of apnea control.   2) Less sleepiness and less fatigue,  sleeping through the night. But dry mouth.  Adjusting the humidifier per DME.  New mask fiting per DME    3)  Rv in 12 months, alternating between me and NP,    LUNA machine at home plus travel CPAP( Set at 7.5 cm water)      I would like to thank Jarold Medici, MD and Jarold Medici, Md [address] [place],  [place] [address] for allowing me to meet with this pleasant patient.   Sleep Clinic Patients are generally offered input on sleep hygiene, life style changes and how to improve compliance with medical treatment where applicable. Review and reiteration of good sleep hygiene measures is offered to any sleep clinic patient, be it in the first  consultation or with any follow up visits.    Any patient with sleepiness should be cautioned not to drive, work at heights, or operate dangerous or heavy equipment when feeling tired or sleepy.      The patient will be seen in follow-up in the sleep clinic at [facility] for discussion of test results, sleep related symptoms and treatment compliance review, further management strategies, etc.   The referring provider will be notified of the test results.   The patient's condition requires frequent monitoring and adjustments in the treatment plan, reflecting the ongoing complexity of care.  This provider is the continuing focal point for all needed services for this condition.  After spending a total time of  35  minutes face to face and time for  history taking, physical and neurologic examination, review of laboratory studies,  personal review of imaging studies, reports and results of other testing and review of referral information / records as far as provided in visit,   Electronically signed by: Dedra Gores, MD 01/26/2024 9:38 AM  [facility] Board certified by The [employer] of Sleep Medicine and Diplomate of the [employer] of Sleep Medicine. Board certified In Neurology through the ABPN, Fellow of the [employer] of Neurology.

## 2024-01-26 NOTE — Patient Instructions (Signed)
 58 y.o. year old female  here with:     1) OSA on CPAP, has good compliance and high degree of apnea control.     2) Less sleepiness and less fatigue,  sleeping through the night. But dry mouth.  Adjusting the humidifier per DME.  New mask fiting per DME      3)  Rv in 12 months, alternating between me and NP,     LUNA machine at home plus travel CPAP( Set at 7.5 cm water)    cc Dr Margaret, Dr Jarold.

## 2024-02-07 ENCOUNTER — Ambulatory Visit: Payer: Federal, State, Local not specified - PPO | Admitting: Plastic Surgery

## 2024-02-23 ENCOUNTER — Encounter: Payer: Self-pay | Admitting: Internal Medicine

## 2024-02-28 ENCOUNTER — Telehealth (INDEPENDENT_AMBULATORY_CARE_PROVIDER_SITE_OTHER): Admitting: Internal Medicine

## 2024-02-28 ENCOUNTER — Encounter: Payer: Self-pay | Admitting: Internal Medicine

## 2024-02-28 DIAGNOSIS — R490 Dysphonia: Secondary | ICD-10-CM | POA: Diagnosis not present

## 2024-02-28 DIAGNOSIS — G4733 Obstructive sleep apnea (adult) (pediatric): Secondary | ICD-10-CM

## 2024-02-28 DIAGNOSIS — R0982 Postnasal drip: Secondary | ICD-10-CM | POA: Diagnosis not present

## 2024-02-28 DIAGNOSIS — J309 Allergic rhinitis, unspecified: Secondary | ICD-10-CM

## 2024-02-28 MED ORDER — AZELASTINE HCL 0.1 % NA SOLN: 1.0000 | Freq: Two times a day (BID) | NASAL | 2 refills | Status: AC

## 2024-02-28 NOTE — Assessment & Plan Note (Signed)
 Chronic, reports compliance with CPAP. Encouraged to wear CPAP at least four hours per night.

## 2024-02-28 NOTE — Assessment & Plan Note (Signed)
 Chronic postnasal drip with red-tinged mucus, increased hoarseness, and dry throat. Possible CPAP contribution. Consideration of breast cancer and radiation history. Differential includes dry air causing epistaxis. - Referred to ENT for hoarseness and postnasal drip evaluation. - Prescribed Astelin nasal spray. - Advised nighttime humidifier use if dry air suspected.

## 2024-02-28 NOTE — Progress Notes (Signed)
 Virtual Visit via Video Note  I,Leslie Leslie, CMA,acting as a scribe for Leslie Leslie, Leslie Leslie.,have documented all relevant documentation on the behalf of Leslie Leslie, Leslie Leslie,as directed by  Leslie Leslie, Leslie Leslie while in the presence of Leslie Leslie, Leslie Leslie.  I connected with Leslie Leslie on 02/28/24 at  4:20 PM EST by a video enabled telemedicine application and verified that I am speaking with the correct person using two identifiers.  Patient Location: Other:  Work, Research Scientist (medical) Location: Office/Clinic  I discussed the limitations, risks, security, and privacy concerns of performing an evaluation and management service by video and the availability of in person appointments. I also discussed with the patient that there may be a patient responsible charge related to this service. The patient expressed understanding and agreed to proceed.  Subjective: PCP: Leslie Leslie, Leslie Leslie  Chief Complaint  Patient presents with   Hoarse  Discussed the use of AI scribe software for clinical note transcription with the patient, who gave verbal consent to proceed.  History of Present Illness Leslie Leslie is a 58 year old female with a history of breast cancer who presents with postnasal drip and hoarseness.  She has persistent postnasal drip despite being on Singulair . Approximately three to four weeks ago, she noticed mucus with a tinge of redness, primarily in the mornings, and is uncertain if this is blood or tissue.  She experiences increased hoarseness, with her voice going in and out after talking for a while, and describes a sensation of dryness in her throat despite staying hydrated. No scratchy throat is reported.  She uses a CPAP machine with a humidifier at night and experiences dry mouth, possibly due to an improper seal of the cushion.  Her current medications include Singulair  and Zyrtec. She has not yet switched to Zytel. She is concerned about her medical  history of breast cancer and the treatments she has undergone, including radiation therapy.    ROS: Per HPI  Current Outpatient Medications:    azelastine (ASTELIN) 0.1 % nasal spray, Place 1 spray into both nostrils 2 (two) times daily. Use in each nostril as directed, Disp: 30 mL, Rfl: 2   aspirin  EC 81 MG tablet, Take 81 mg by mouth every morning., Disp: , Rfl:    atorvastatin  (LIPITOR) 20 MG tablet, TAKE 1 TABLET(20 MG) BY MOUTH DAILY, Disp: 90 tablet, Rfl: 2   CALCIUM  PO, Take by mouth., Disp: , Rfl:    Coenzyme Q10 100 MG capsule, Take 1 capsule by mouth daily., Disp: , Rfl:    hydrochlorothiazide  (MICROZIDE ) 12.5 MG capsule, TAKE 1 CAPSULE(12.5 MG) BY MOUTH DAILY, Disp: 90 capsule, Rfl: 1   metoprolol  succinate (TOPROL -XL) 25 MG 24 hr tablet, TAKE 1 TABLET(25 MG) BY MOUTH DAILY, Disp: 90 tablet, Rfl: 2   montelukast  (SINGULAIR ) 10 MG tablet, TAKE 1 TABLET(10 MG) BY MOUTH DAILY, Disp: 90 tablet, Rfl: 2   Multiple Vitamins-Minerals (MULTIVITAMIN WOMENS 50+ ADV PO), Take by mouth., Disp: , Rfl:    Omega-3 Fatty Acids (FISH OIL PO), Take by mouth. 1 capsule daily, Disp: , Rfl:    valACYclovir  (VALTREX ) 500 MG tablet, TAKE 1 TABLET BY MOUTH DAILY AS NEEDED, Disp: 90 tablet, Rfl: 1  Observations/Objective: There were no vitals filed for this visit. Physical Exam Vitals and nursing note reviewed.  Constitutional:      Appearance: Normal appearance.  HENT:     Head: Normocephalic and atraumatic.  Eyes:     Extraocular  Movements: Extraocular movements intact.  Pulmonary:     Effort: Pulmonary effort is normal.  Musculoskeletal:     Cervical back: Normal range of motion.  Neurological:     Mental Status: She is alert.     Assessment and Plan: Allergic rhinitis with postnasal drip Assessment & Plan: Chronic postnasal drip with red-tinged mucus, increased hoarseness, and dry throat. Possible CPAP contribution. Consideration of breast cancer and radiation history. Differential  includes dry air causing epistaxis. - Referred to ENT for hoarseness and postnasal drip evaluation. - Prescribed Astelin nasal spray. - Advised nighttime humidifier use if dry air suspected.   Hoarseness -     Ambulatory referral to ENT  [facility] on CPAP Assessment & Plan: Chronic, reports compliance with CPAP. Encouraged to wear CPAP at least four hours per night.    Other orders -     Azelastine HCl; Place 1 spray into both nostrils 2 (two) times daily. Use in each nostril as directed  Dispense: 30 mL; Refill: 2  Follow Up Instructions: Return if symptoms worsen or fail to improve.   I discussed the assessment and treatment plan with the patient. The patient was provided an opportunity to ask questions, and all were answered. The patient agreed with the plan and demonstrated an understanding of the instructions.   The patient was advised to call back or seek an in-person evaluation if the symptoms worsen or if the condition fails to improve as anticipated.  The above assessment and management plan was discussed with the patient. The patient verbalized understanding of and has agreed to the management plan.   I, Leslie Leslie, Leslie Leslie, have reviewed all documentation for this visit. The documentation on 02/28/24 for the exam, diagnosis, procedures, and orders are all accurate and complete.

## 2024-02-28 NOTE — Patient Instructions (Addendum)
 Hoarseness  Hoarseness, also called dysphonia, is any abnormal change in your voice that can make it difficult to speak. Your voice may sound raspy, breathy, or strained. Hoarseness is caused by a problem with your vocal cords (vocal folds). These are two bands of tissue inside your voice box (larynx). When you speak, your vocal cords move back and forth to create sound. The surfaces of your vocal cords need to be smooth for your voice to sound clear. Swelling or lumps on your vocal cords can cause hoarseness. Vocal cord problems may be the result of injuries or abnormal growths, certain diseases, upper respiratory infection, or allergies. Other causes may include medicine side effects and exposure to irritants. Follow these instructions at home:  Pay attention to any changes in your symptoms. Take these actions to stay safe and to help relieve your symptoms: Lifestyle Do not eat foods that give you heartburn, such as spicy or acidic foods like hot peppers and orange juice. These foods can cause a gastroesophageal reflux that may worsen your vocal cord problems. Limit how much alcohol and caffeine you drink as told by your health care provider. Drink enough fluid to keep your urine pale yellow. Do not use any products that contain nicotine or tobacco. These products include cigarettes, chewing tobacco, and vaping devices, such as e-cigarettes. If you need help quitting, ask your health care provider. Avoid secondhand smoke. General instructions Use a humidifier if the air in your home is dry. Avoid coughing or clearing your throat. Do not whisper. Whispering can cause muscle strain. Do not speak in a loud or harsh voice. Rest your voice. If recommended by your health care provider, schedule an appointment with a speech-language specialist. This specialist may give you methods to try that can help you avoid misusing your voice. Contact a health care provider if: Your voice is hoarse longer than  2 weeks. You almost lose or completely lose your voice for more than 3 days. You have pain when you swallow or try to talk. You feel a lump in your neck. Get help right away if: You have trouble swallowing. You feel like you are choking when you swallow. You cough up blood or vomit blood. You have trouble breathing. You choke, cannot swallow, or cannot breathe if you lie flat. You notice swelling or a rash on your body, face, or tongue. These symptoms may represent a serious problem that is an emergency. Do not wait to see if the symptoms will go away. Get medical help right away. Call your local emergency services (911 in the [place] Do not drive yourself to the hospital. Summary Hoarseness, also called dysphonia, is any abnormal change in your voice that can make it difficult to speak. Your voice may sound raspy, breathy, or strained. Hoarseness is caused by a problem with your vocal cords (vocal folds). Do not speak in a loud or harsh voice, whisper, use nicotine or tobacco products, or eat foods that give you heartburn. See your health care provider if your hoarseness does not improve after 2 weeks. This information is not intended to replace advice given to you by your health care provider. Make sure you discuss any questions you have with your health care provider. Document Revised: 09/16/2020 Document Reviewed: 09/16/2020 Elsevier Patient Education  2024 [employer].

## 2024-03-20 ENCOUNTER — Encounter: Payer: Self-pay | Admitting: Neurology

## 2024-03-21 NOTE — Telephone Encounter (Signed)
 Pt called stating that she is due for another Cpap Supply order and she is needing the new cushion order put in so that she can get it in the next week. Pt would like a call back from the nurse. Please advise.

## 2024-03-26 DIAGNOSIS — G4733 Obstructive sleep apnea (adult) (pediatric): Secondary | ICD-10-CM | POA: Diagnosis not present

## 2024-03-28 ENCOUNTER — Encounter (INDEPENDENT_AMBULATORY_CARE_PROVIDER_SITE_OTHER): Payer: Self-pay

## 2024-03-28 ENCOUNTER — Ambulatory Visit (INDEPENDENT_AMBULATORY_CARE_PROVIDER_SITE_OTHER)

## 2024-03-28 VITALS — BP 123/84 | HR 60

## 2024-03-28 DIAGNOSIS — R09A2 Foreign body sensation, throat: Secondary | ICD-10-CM

## 2024-03-28 DIAGNOSIS — R0982 Postnasal drip: Secondary | ICD-10-CM | POA: Diagnosis not present

## 2024-03-28 DIAGNOSIS — R490 Dysphonia: Secondary | ICD-10-CM | POA: Diagnosis not present

## 2024-03-28 DIAGNOSIS — K219 Gastro-esophageal reflux disease without esophagitis: Secondary | ICD-10-CM

## 2024-03-28 MED ORDER — PANTOPRAZOLE SODIUM 20 MG PO TBEC: 20.0000 mg | DELAYED_RELEASE_TABLET | Freq: Every day | ORAL | 0 refills | Status: AC

## 2024-03-28 NOTE — Progress Notes (Signed)
 Dear Dr. Jarold, Here is my assessment for our mutual patient, Leslie Salazar. Thank you for allowing me the opportunity to care for your patient. Please do not hesitate to contact me should you have any other questions. Sincerely, Dr. Hadassah Parody  Otolaryngology Clinic Note Referring provider: Dr. Jarold HPI:   Initial HPI (03/28/2024) Discussed the use of AI scribe software for clinical note transcription with the patient, who gave verbal consent to proceed.  History of Present Illness Leslie Salazar is a 58 year old female with a history of breast cancer who presents with hoarseness and throat discomfort.  Hoarseness and voice changes Globus sensation - Hoarseness present for 6-8 weeks - Loss of voice mid-sentence and words not coming out clearly - History of breast cancer treated with radiation in 2012.  Wants to ensure there is no lesion in her throat - Also reporting globus sensation that started around the time of the hoarseness.   - No ear pain - No neck masses or lumps - No history of smoking or vaping - Reduced coffee intake, suspecting caffeine or acidity as contributing factors  Postnasal drip - This has been chronic and is bothersome.  She uses Singulair  for management but it does not help.  Relevant medical history and treatments - Gastroesophageal reflux disease (GERD) diagnosed over ten years ago, not currently on medication for reflux after TFL exam - Sleep apnea managed with CPAP machine with humidifier - Long-term use of Singulair  for postnasal drip    Independent Review of Additional Tests or Records:  Referral note Catheryn Jarold, MD 02/28/24: pt with worsening hoarseness and post-nasal drip. 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 repeat in 2 years   PONV (postoperative nausea and vomiting)    nausea no vomiting   Scoliosis    Sleep apnea    c- pap nightly   Urinary urgency    Wears glasses      Past Surgical History:  Procedure Laterality Date   BREAST CAPSULECTOMY WITH IMPLANT EXCHANGE Right 06/27/2019   Procedure: release of capsular contracture with repositioning of the right implant with possible exchange of right breast implant;  Surgeon: Lowery Estefana RAMAN, DO;  Location: [facility];  Service: Plastics;  Laterality: Right;   BREAST RECONSTRUCTION WITH PLACEMENT OF TISSUE EXPANDER AND FLEX HD (ACELLULAR HYDRATED DERMIS) Right 11/16/2012   Procedure: RIGHT LATISSIMUS myocutaneous FLAP WITH PLACEMENT OF TISSUE EXPANDER  TO RIGHT BREAST;  Surgeon: Estefana Reichert, DO;  Location: [facility];  Service: Plastics;  Laterality: Right;   CAPSULECTOMY Bilateral 01/27/2023   Procedure: [id number];  Surgeon: Lowery Estefana RAMAN, DO;  Location: [facility];  Service: Plastics;  Laterality: Bilateral;   CARDIAC CATHETERIZATION N/A 02/28/2015   Procedure: Left Heart  Cath and Coronary Angiography;  Surgeon: Gordy Bergamo, MD;  Location: [facility];  Service: Cardiovascular;  Laterality: N/A;   CESAREAN SECTION  2000   ENDOMETRIAL ABLATION W/ NOVASURE  2007   INSERTION OF TISSUE EXPANDER AFTER MASTECTOMY Bilateral 04/20/2011   Breast Ca (r) T1b N0 M0 stage IA infiltrating ductal carcinoma   LAPAROSCOPIC BILATERAL SALPINGO OOPHERECTOMY Bilateral 08/30/2018   Procedure: LAPAROSCOPIC BILATERAL SALPINGO OOPHORECTOMY w/ PERITONEAL  WASHINGS;  Surgeon: Lavoie, Marie-Lyne, MD;  Location: [facility];  Service: Gynecology;  Laterality: Bilateral;   LASER ABLATION OF THE CERVIX  1994   FOR DYSPLASIA   LIPOSUCTION WITH LIPOFILLING Right 03/14/2013   Procedure: LIPOSUCTION WITH LIPOFILLING;  Surgeon: Estefana Reichert, DO;  Location: [facility];  Service: Plastics;  Laterality: Right;   LIPOSUCTION WITH LIPOFILLING Right 06/27/2019   Procedure: Fat grafting to right breast with excision of back excess skin;  Surgeon: Lowery Estefana RAMAN, DO;  Location: [facility];  Service: Plastics;  Laterality: Right;  2 hours, please   PLACEMENT OF BREAST IMPLANTS Bilateral 01/27/2023   Procedure: PLACEMENT OF BREAST IMPLANTS;  Surgeon: Lowery Estefana RAMAN, DO;  Location: [facility];  Service: Plastics;  Laterality: Bilateral;   RECONSTRUCTION BREAST W/ LATISSIMUS DORSI FLAP Right 11/16/2012   REMOVAL OF TISSUE EXPANDER AND PLACEMENT OF IMPLANT Right 03/14/2013   Procedure: REMOVAL OF RIGHT BREAST TISSUE EXPANDER WITH PLACEMENT OF RIGHT BREAST IMPLANT;  Surgeon: Estefana Reichert, DO;  Location: [facility];  Service: Plastics;  Laterality: Right;  Removal of Right Breast Tissue Expander with Placement of Right Breast Implant, Possible Lipsuction with Lipofilling   TISSUE EXPANDER PLACEMENT Right 11/16/2012   TISSUE EXPANDER REMOVAL Bilateral 12/2011   TUBAL LIGATION  09/08/2001   BY LAPAROSCOPY ,, HULKA CLIPS TECHNIQUE   WISDOM TOOTH EXTRACTION      Family History  Problem Relation Age of Onset   Diabetes Mother        HAS HAD A KIDNEY TRANSPLATN   Hypertension Mother    Kidney failure Mother    Hypertension Father    Gallstones Father    Hypertension Sister    Gallstones Sister    Hypertension Sister    Colon cancer Paternal Aunt    Lung cancer Maternal Grandfather      Social Connections: Socially Isolated (02/24/2024)   Social Connection and Isolation Panel     Frequency of Communication with Friends and Family: Once a week    Frequency of Social Gatherings with Friends and Family: Once a week    Attends Religious Services: 1 to 4 times per year    Active Member of [employer] or Organizations: No    Attends Engineer, Structural: Not on file    Marital Status: Separated     Current Outpatient Medications  Medication Instructions   aspirin  EC 81 mg, BH-each morning   atorvastatin  (LIPITOR) 20 MG tablet TAKE 1 TABLET(20 MG) BY MOUTH DAILY   azelastine  (ASTELIN ) 0.1 % nasal spray 1 spray, Each Nare, 2 times daily, Use in each nostril as directed   CALCIUM  PO Take by mouth.   Coenzyme Q10 100 MG capsule 1 capsule, Daily   hydrochlorothiazide  (MICROZIDE ) 12.5 MG capsule TAKE 1 CAPSULE(12.5 MG) BY MOUTH DAILY   metoprolol  succinate (TOPROL -XL) 25 MG 24 hr tablet TAKE 1 TABLET(25 MG) BY MOUTH DAILY   montelukast  (SINGULAIR ) 10 MG tablet TAKE 1 TABLET(10 MG) BY MOUTH DAILY   Multiple Vitamins-Minerals (MULTIVITAMIN WOMENS  50+ ADV PO) Take by mouth.   Omega-3 Fatty Acids (FISH OIL PO) Take by mouth. 1 capsule daily   pantoprazole  (PROTONIX ) 20 mg, Oral, Daily   valACYclovir  (VALTREX ) 500 mg, Oral, Daily PRN     Physical Exam:   BP 123/84   Pulse 60   SpO2 97%   Salient findings:  CN II-XII intact Bilateral EAC clear and TM intact with well pneumatized middle ear spaces Anterior rhinoscopy: Septum midline; bilateral inferior turbinates with mild hypertrophy No lesions of oral cavity/oropharynx No obviously palpable neck masses/lymphadenopathy/thyromegaly No respiratory distress or stridor  TFL was indicated to better evaluate the proximal airway, given the patient's history and exam findings, and is detailed below.   Seprately Identifiable Procedures:  Prior to initiating any procedures, risks/benefits/alternatives were explained to the patient and verbal consent obtained.  Procedure Note (03/28/2024) Pre-procedure diagnosis:  Dysphonia   Post-procedure diagnosis: Muscle tension dysphonia Procedure: Transnasal Fiberoptic Laryngoscopy, CPT 31575 - Mod 25 Indication: dysphonia Complications: None apparent EBL: 0 mL  The procedure was undertaken to further evaluate the patient's complaint of dysphonia, with mirror exam inadequate for appropriate examination due to gag reflex and poor patient tolerance  Procedure:  Patient was identified as correct patient. Verbal consent was obtained. The nose was sprayed with oxymetazoline and 4% lidocaine . The The flexible laryngoscope was passed through the nose to view the nasal cavity, pharynx (oropharynx, hypopharynx) and larynx.  The larynx was examined at rest and during multiple phonatory tasks. Documentation was obtained and reviewed with patient. The scope was removed. The patient tolerated the procedure well.  Findings: The nasal cavity and nasopharynx did not reveal any masses or lesions, mucosa appeared to be without obvious lesions. The tongue base, pharyngeal walls, piriform sinuses, vallecula, epiglottis and postcricoid region are normal in appearance EXCEPT: Postcricoid edema, muscle tension type pattern with phonation, arytenoids hooding over vocal cords.  No lesions on the vocal cords. The visualized portion of the subglottis and proximal trachea is widely patent. The vocal folds are mobile bilaterally. There are no lesions on the free edge of the vocal folds nor elsewhere in the larynx worrisome for malignancy.    Electronically signed by: Hadassah JAYSON Parody, MD 03/28/2024 5:47 PM   Impression & Plans:  Leslie Salazar is a 58 y.o. female with   1. Muscle tension dysphonia   2. Laryngopharyngeal reflux (LPR)   3. Post-nasal drip   4. Globus sensation     Assessment and Plan Assessment & Plan Muscle tension dysphonia Globus sensation Hoarseness and sensation of something in the throat for 6-8 weeks, likely due to muscle tension dysphonia. No masses or lesions on vocal  cords. Symptoms have been improving but she is interested in treatment options if available.  Stress identified as a potential trigger and she has had a stressful past 6 months. No cancer detected. - Referred to voice therapy for muscle tension dysphonia - Advised on stress management strategies.  Laryngopharyngeal reflux Postnasal drip Postnasal drip and sensation of drainage, likely exacerbated by laryngopharyngeal reflux. No significant nasal drainage observed. Symptoms include redness in the larynx. No current reflux medication.  - Prescribed low-dose reflux medication for 3 months. - Advised on dietary modifications to avoid acidic foods and reduce coffee intake. - Discussed Reflux Gourmet as an adjunctive treatment option. - Follow-up in 3 months to see how she is doing with the medication.  Goal would be to help with postnasal drip and taper medication   I personally spent a total of  50 minutes in the care of the patient today including preparing to see the patient, getting/reviewing separately obtained history, counseling and educating, and documenting clinical information in the EHR. We had a thorough conversation regarding etiology of post-nasal drip as well as muscle tension dysphonia and possible next steps.    See below regarding exact medications prescribed this encounter including dosages and route: Meds ordered this encounter  Medications   pantoprazole  (PROTONIX ) 20 MG tablet    Sig: Take 1 tablet (20 mg total) by mouth daily.    Dispense:  90 tablet    Refill:  0    Thank you for allowing me the opportunity to care for your patient. Please do not hesitate to contact me should you have any other questions.  Sincerely, Hadassah Parody, MD Otolaryngologist (ENT), [facility] Phone: [phone] Fax: [phone]

## 2024-03-31 ENCOUNTER — Encounter (INDEPENDENT_AMBULATORY_CARE_PROVIDER_SITE_OTHER): Payer: Self-pay

## 2024-04-01 DIAGNOSIS — G4733 Obstructive sleep apnea (adult) (pediatric): Secondary | ICD-10-CM | POA: Diagnosis not present

## 2024-04-02 ENCOUNTER — Telehealth (INDEPENDENT_AMBULATORY_CARE_PROVIDER_SITE_OTHER): Payer: Self-pay

## 2024-04-02 NOTE — Telephone Encounter (Signed)
 Called pharmacy to check on prescription they did not receive it yet so I gave them the information over the phone for the medication.   Then I called the patient to let her know the pharmacy has the prescription in and they will let her know when it is ready.  Patient understood.

## 2024-04-08 ENCOUNTER — Other Ambulatory Visit: Payer: Self-pay | Admitting: Internal Medicine

## 2024-04-11 DIAGNOSIS — M25551 Pain in right hip: Secondary | ICD-10-CM | POA: Diagnosis not present

## 2024-04-14 ENCOUNTER — Other Ambulatory Visit: Payer: Self-pay | Admitting: Internal Medicine

## 2024-04-16 ENCOUNTER — Other Ambulatory Visit: Payer: Self-pay | Admitting: Internal Medicine

## 2024-04-19 ENCOUNTER — Ambulatory Visit: Payer: Self-pay | Admitting: Internal Medicine

## 2024-04-25 ENCOUNTER — Encounter: Payer: Self-pay | Admitting: Internal Medicine

## 2024-04-25 ENCOUNTER — Ambulatory Visit: Payer: Self-pay | Admitting: Internal Medicine

## 2024-04-25 VITALS — BP 110/80 | HR 90 | Temp 98.3°F | Ht 62.0 in | Wt 150.2 lb

## 2024-04-25 DIAGNOSIS — I10 Essential (primary) hypertension: Secondary | ICD-10-CM

## 2024-04-25 DIAGNOSIS — M76891 Other specified enthesopathies of right lower limb, excluding foot: Secondary | ICD-10-CM

## 2024-04-25 DIAGNOSIS — E7800 Pure hypercholesterolemia, unspecified: Secondary | ICD-10-CM

## 2024-04-25 DIAGNOSIS — Z820 Family history of epilepsy and other diseases of the nervous system: Secondary | ICD-10-CM

## 2024-04-25 DIAGNOSIS — R490 Dysphonia: Secondary | ICD-10-CM

## 2024-04-25 DIAGNOSIS — K219 Gastro-esophageal reflux disease without esophagitis: Secondary | ICD-10-CM | POA: Diagnosis not present

## 2024-04-25 DIAGNOSIS — L659 Nonscarring hair loss, unspecified: Secondary | ICD-10-CM

## 2024-04-25 DIAGNOSIS — G4733 Obstructive sleep apnea (adult) (pediatric): Secondary | ICD-10-CM

## 2024-04-25 NOTE — Progress Notes (Signed)
 I,Victoria T Emmitt, CMA,acting as a neurosurgeon for Catheryn LOISE Slocumb, MD.,have documented all relevant documentation on the behalf of Catheryn LOISE Slocumb, MD,as directed by  Catheryn LOISE Slocumb, MD while in the presence of Catheryn LOISE Slocumb, MD.  Subjective:  Patient ID: Leslie Salazar , female    DOB: 12/09/1965 , 59 y.o.   MRN: [id number]  Chief Complaint  Patient presents with   Hypertension    She presents today for BP check. Reports compliance with meds. She is not having any cp, sob or palpitations.  She states [facility] the back of her leg. Tendonitis found. She was told to lay off exercising right now. She still complains of discomfort.    Hyperlipidemia    HPI Discussed the use of AI scribe software for clinical note transcription with the patient, who gave verbal consent to proceed.  History of Present Illness Leslie Salazar is a 59 year old female who presents for a blood pressure check and follow-up on dysphonia and hip pain.  She was diagnosed with dysphonia after a referral to an ENT specialist, where a scope revealed 'hooding over the vocal cords.' The patient recalls being told that stress may be a contributing factor. She experiences postnasal drip and has been using an acid reflux medication for three months, which has improved her symptoms, though her voice occasionally goes 'in and out'. She also uses [employer] to manage symptoms triggered by certain foods.  She has a history of gastroesophageal reflux disease (GERD) and was previously prescribed medication for acid reflux. The medication has been beneficial in reducing her symptoms.  She reports right hip pain that began after performing bridge exercises in late November. The pain is located at the back part of the hip, near the buttock, and she experiences stiffness after prolonged sitting. She reports being told she has hip tendinitis and takes ibuprofen. She performs stretches as recommended and was  instructed to follow up in six weeks.  She mentions stress related to her father's health, who is 75 years old and has been in a nursing home since June of last year. He has wound issues on his foot, carpal tunnel syndrome, spinal stenosis, and spinal cord damage. He is on Lyrica and Cymbalta for pain management.   Hypertension This is a chronic problem. The current episode started more than 1 year ago. The problem has been rapidly improving since onset. The problem is controlled. Pertinent negatives include no blurred vision or orthopnea. Risk factors for coronary artery disease include post-menopausal state. Past treatments include diuretics and beta blockers. The current treatment provides moderate improvement. There are no compliance problems.      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 repeat in 2 years   PONV (postoperative nausea and vomiting)    nausea no vomiting   Scoliosis  Sleep apnea    c- pap nightly   Urinary urgency    Wears glasses      Family History  Problem Relation Age of Onset   Diabetes Mother        HAS HAD A KIDNEY TRANSPLATN   Hypertension Mother    Kidney failure Mother    Hypertension Father    Gallstones Father    Hypertension Sister    Gallstones Sister    Hypertension Sister    Colon cancer Paternal Aunt    Lung cancer Maternal Grandfather      Current Outpatient Medications:    aspirin  EC 81 MG tablet, Take 81 mg by mouth every morning., Disp: , Rfl:    atorvastatin  (LIPITOR) 20 MG  tablet, TAKE 1 TABLET(20 MG) BY MOUTH DAILY, Disp: 90 tablet, Rfl: 2   azelastine  (ASTELIN ) 0.1 % nasal spray, Place 1 spray into both nostrils 2 (two) times daily. Use in each nostril as directed, Disp: 30 mL, Rfl: 2   CALCIUM  PO, Take by mouth., Disp: , Rfl:    Coenzyme Q10 100 MG capsule, Take 1 capsule by mouth daily., Disp: , Rfl:    hydrochlorothiazide  (MICROZIDE ) 12.5 MG capsule, TAKE 1 CAPSULE(12.5 MG) BY MOUTH DAILY, Disp: 90 capsule, Rfl: 1   metoprolol  succinate (TOPROL -XL) 25 MG 24 hr tablet, TAKE 1 TABLET(25 MG) BY MOUTH DAILY, Disp: 90 tablet, Rfl: 2   montelukast  (SINGULAIR ) 10 MG tablet, TAKE 1 TABLET(10 MG) BY MOUTH DAILY, Disp: 90 tablet, Rfl: 2   Multiple Vitamins-Minerals (MULTIVITAMIN WOMENS 50+ ADV PO), Take by mouth., Disp: , Rfl:    Omega-3 Fatty Acids (FISH OIL PO), Take by mouth. 1 capsule daily, Disp: , Rfl:    pantoprazole  (PROTONIX ) 20 MG tablet, Take 1 tablet (20 mg total) by mouth daily., Disp: 90 tablet, Rfl: 0   valACYclovir  (VALTREX ) 500 MG tablet, TAKE 1 TABLET BY MOUTH DAILY AS NEEDED, Disp: 90 tablet, Rfl: 1   No Known Allergies   Review of Systems  Constitutional: Negative.   HENT:  Positive for postnasal drip.   Eyes:  Negative for blurred vision.  Respiratory: Negative.    Cardiovascular: Negative.  Negative for orthopnea.  Gastrointestinal: Negative.   Musculoskeletal:  Positive for arthralgias.  Neurological: Negative.   Psychiatric/Behavioral: Negative.       Today's Vitals   04/25/24 1521  BP: 110/80  Pulse: 90  Temp: 98.3 F (36.8 C)  SpO2: 98%  Weight: 150 lb 3.2 oz (68.1 kg)  Height: 5' 2 (1.575 m)   Body mass index is 27.47 kg/m.  Wt Readings from Last 3 Encounters:  04/25/24 150 lb 3.2 oz (68.1 kg)  01/26/24 150 lb (68 kg)  10/12/23 145 lb 9.6 oz (66 kg)    The 10-year ASCVD risk score (Arnett DK, et al., 2019) is: 2.6%   Values used to calculate the score:     Age: 35 years     Clinically relevant sex: Female     Is  Non-Hispanic African American: Yes     Diabetic: No     Tobacco smoker: No     Systolic Blood Pressure: 110 mmHg     Is BP treated: Yes     HDL Cholesterol: 69 mg/dL     Total Cholesterol: 160 mg/dL  Objective:  Physical Exam Vitals and nursing note reviewed.  Constitutional:      Appearance: Normal appearance.  HENT:     Head: Normocephalic and atraumatic.  Eyes:  Extraocular Movements: Extraocular movements intact.  Cardiovascular:     Rate and Rhythm: Normal rate and regular rhythm.     Heart sounds: Normal heart sounds.  Pulmonary:     Effort: Pulmonary effort is normal.     Breath sounds: Normal breath sounds.  Musculoskeletal:     Cervical back: Normal range of motion.  Skin:    General: Skin is warm.  Neurological:     General: No focal deficit present.     Mental Status: She is alert.  Psychiatric:        Mood and Affect: Mood normal.        Behavior: Behavior normal.         Assessment And Plan:   Assessment & Plan Essential hypertension, benign Chronic, well controlled. She will continue with hydrochlorothiazide  12.5mg  daily and metoprolol  succinate XL 25mg  daily.  - No med changes today.  - She will rto in six months for re-evaluation.  Pure hypercholesterolemia Chronic, she will continue with atorvastatin  20mg  daily.  She has had calcium  scoring, results are ZERO.   -Continue to follow heart healthy lifestyle,she was commended on her recent lifestyle changes Gastroesophageal reflux disease without esophagitis GERD symptoms improved with medication, voice fluctuation persists. - Continue acid reflux medication. - Use Reflux [employer] as needed after trigger foods. - Stop eating 3 hours prior to lying down.  OSA on CPAP Chronic, reports compliance with CPAP. Encouraged to wear CPAP at least four hours per night.  Right hip tendinitis Right hip pain due to tendinitis, worsened by hip flexor exercises. - Avoid hip flexor exercises. - Continue  ibuprofen as needed. - Follow up with orthopedic specialist in six weeks. - Consider referral to chiropractor for active release and laser therapy. Hair thinning Hair thinning on top of head likely due to estrogen deficiency. - Ordered blood count, iron panel, and thyroid  function tests. Family history of Alzheimer's disease Consider Neuro referral for further evaluation.  Dysphonia Dysphonia likely from stress and postnasal drip, improved with acid reflux medication. - Continue acid reflux medication. - Follow up with speech therapy as recommended by ENT.   Orders Placed This Encounter  Procedures   CBC   CMP14+EGFR   Iron, TIBC and Ferritin Panel   TSH   Ambulatory referral to Neurology   Return if symptoms worsen or fail to improve.  Patient was given opportunity to ask questions. Patient verbalized understanding of the plan and was able to repeat key elements of the plan. All questions were answered to their satisfaction.    I, Catheryn LOISE Slocumb, MD, have reviewed all documentation for this visit. The documentation on 04/25/2024 for the exam, diagnosis, procedures, and orders are all accurate and complete.   IF YOU HAVE BEEN REFERRED TO A SPECIALIST, IT MAY TAKE 1-2 WEEKS TO SCHEDULE/PROCESS THE REFERRAL. IF YOU HAVE NOT HEARD FROM US /SPECIALIST IN TWO WEEKS, PLEASE GIVE US  A CALL AT [phone].

## 2024-04-25 NOTE — Patient Instructions (Signed)
 Hypertension, Adult Hypertension is another name for high blood pressure. High blood pressure forces your heart to work harder to pump blood. This can cause problems over time. There are two numbers in a blood pressure reading. There is a top number (systolic) over a bottom number (diastolic). It is best to have a blood pressure that is below 120/80. What are the causes? The cause of this condition is not known. Some other conditions can lead to high blood pressure. What increases the risk? Some lifestyle factors can make you more likely to develop high blood pressure: Smoking. Not getting enough exercise or physical activity. Being overweight. Having too much fat, sugar, calories, or salt (sodium) in your diet. Drinking too much alcohol. Other risk factors include: Having any of these conditions: Heart disease. Diabetes. High cholesterol. Kidney disease. Obstructive sleep apnea. Having a family history of high blood pressure and high cholesterol. Age. The risk increases with age. Stress. What are the signs or symptoms? High blood pressure may not cause symptoms. Very high blood pressure (hypertensive crisis) may cause: Headache. Fast or uneven heartbeats (palpitations). Shortness of breath. Nosebleed. Vomiting or feeling like you may vomit (nauseous). Changes in how you see. Very bad chest pain. Feeling dizzy. Seizures. How is this treated? This condition is treated by making healthy lifestyle changes, such as: Eating healthy foods. Exercising more. Drinking less alcohol. Your doctor may prescribe medicine if lifestyle changes do not help enough and if: Your top number is above 130. Your bottom number is above 80. Your personal target blood pressure may vary. Follow these instructions at home: Eating and drinking  If told, follow the DASH eating plan. To follow this plan: Fill one half of your plate at each meal with fruits and vegetables. Fill one fourth of your plate  at each meal with whole grains. Whole grains include whole-wheat pasta, brown rice, and whole-grain bread. Eat or drink low-fat dairy products, such as skim milk or low-fat yogurt. Fill one fourth of your plate at each meal with low-fat (lean) proteins. Low-fat proteins include fish, chicken without skin, eggs, beans, and tofu. Avoid fatty meat, cured and processed meat, or chicken with skin. Avoid pre-made or processed food. Limit the amount of salt in your diet to less than 1,500 mg each day. Do not drink alcohol if: Your doctor tells you not to drink. You are pregnant, may be pregnant, or are planning to become pregnant. If you drink alcohol: Limit how much you have to: 0-1 drink a day for women. 0-2 drinks a day for men. Know how much alcohol is in your drink. In the [place] one drink equals one 12 oz bottle of beer (355 mL), one 5 oz glass of wine (148 mL), or one 1 oz glass of hard liquor (44 mL). Lifestyle  Work with your doctor to stay at a healthy weight or to lose weight. Ask your doctor what the best weight is for you. Get at least 30 minutes of exercise that causes your heart to beat faster (aerobic exercise) most days of the week. This may include walking, swimming, or biking. Get at least 30 minutes of exercise that strengthens your muscles (resistance exercise) at least 3 days a week. This may include lifting weights or doing Pilates. Do not smoke or use any products that contain nicotine or tobacco. If you need help quitting, ask your doctor. Check your blood pressure at home as told by your doctor. Keep all follow-up visits. Medicines Take over-the-counter and prescription medicines  only as told by your doctor. Follow directions carefully. Do not skip doses of blood pressure medicine. The medicine does not work as well if you skip doses. Skipping doses also puts you at risk for problems. Ask your doctor about side effects or reactions to medicines that you should watch  for. Contact a doctor if: You think you are having a reaction to the medicine you are taking. You have headaches that keep coming back. You feel dizzy. You have swelling in your ankles. You have trouble with your vision. Get help right away if: You get a very bad headache. You start to feel mixed up (confused). You feel weak or numb. You feel faint. You have very bad pain in your: Chest. Belly (abdomen). You vomit more than once. You have trouble breathing. These symptoms may be an emergency. Get help right away. Call 911. Do not wait to see if the symptoms will go away. Do not drive yourself to the hospital. Summary Hypertension is another name for high blood pressure. High blood pressure forces your heart to work harder to pump blood. For most people, a normal blood pressure is less than 120/80. Making healthy choices can help lower blood pressure. If your blood pressure does not get lower with healthy choices, you may need to take medicine. This information is not intended to replace advice given to you by your health care provider. Make sure you discuss any questions you have with your health care provider. Document Revised: 01/15/2021 Document Reviewed: 01/15/2021 Elsevier Patient Education  2024 [employer].

## 2024-04-26 LAB — CMP14+EGFR
ALT: 23 IU/L (ref 0–32)
AST: 17 IU/L (ref 0–40)
Albumin: 4.8 g/dL (ref 3.8–4.9)
Alkaline Phosphatase: 82 IU/L (ref 49–135)
BUN/Creatinine Ratio: 23 (ref 9–23)
BUN: 17 mg/dL (ref 6–24)
Bilirubin Total: 1.3 mg/dL — ABNORMAL HIGH (ref 0.0–1.2)
CO2: 25 mmol/L (ref 20–29)
Calcium: 10.2 mg/dL (ref 8.7–10.2)
Chloride: 101 mmol/L (ref 96–106)
Creatinine, Ser: 0.74 mg/dL (ref 0.57–1.00)
Globulin, Total: 2.6 g/dL (ref 1.5–4.5)
Glucose: 82 mg/dL (ref 70–99)
Potassium: 4.3 mmol/L (ref 3.5–5.2)
Sodium: 141 mmol/L (ref 134–144)
Total Protein: 7.4 g/dL (ref 6.0–8.5)
eGFR: 94 mL/min/1.73

## 2024-04-26 LAB — CBC
Hematocrit: 44.7 % (ref 34.0–46.6)
Hemoglobin: 14.7 g/dL (ref 11.1–15.9)
MCH: 31.2 pg (ref 26.6–33.0)
MCHC: 32.9 g/dL (ref 31.5–35.7)
MCV: 95 fL (ref 79–97)
Platelets: 257 x10E3/uL (ref 150–450)
RBC: 4.71 x10E6/uL (ref 3.77–5.28)
RDW: 12.9 % (ref 11.7–15.4)
WBC: 7 x10E3/uL (ref 3.4–10.8)

## 2024-04-26 LAB — IRON,TIBC AND FERRITIN PANEL
Ferritin: 147 ng/mL (ref 15–150)
Iron Saturation: 13 % — ABNORMAL LOW (ref 15–55)
Iron: 44 ug/dL (ref 27–159)
Total Iron Binding Capacity: 331 ug/dL (ref 250–450)
UIBC: 287 ug/dL (ref 131–425)

## 2024-04-26 LAB — TSH: TSH: 1.02 u[IU]/mL (ref 0.450–4.500)

## 2024-04-30 ENCOUNTER — Encounter: Payer: Self-pay | Admitting: Internal Medicine

## 2024-04-30 ENCOUNTER — Ambulatory Visit: Payer: Self-pay | Admitting: Internal Medicine

## 2024-04-30 DIAGNOSIS — R490 Dysphonia: Secondary | ICD-10-CM | POA: Insufficient documentation

## 2024-04-30 NOTE — Assessment & Plan Note (Signed)
 Dysphonia likely from stress and postnasal drip, improved with acid reflux medication. - Continue acid reflux medication. - Follow up with speech therapy as recommended by ENT.

## 2024-04-30 NOTE — Assessment & Plan Note (Signed)
 Chronic, she will continue with atorvastatin  20mg  daily.  She has had calcium  scoring, results are ZERO.   -Continue to follow heart healthy lifestyle,she was commended on her recent lifestyle changes

## 2024-04-30 NOTE — Assessment & Plan Note (Signed)
 Chronic, reports compliance with CPAP. Encouraged to wear CPAP at least four hours per night.

## 2024-04-30 NOTE — Assessment & Plan Note (Signed)
 Chronic, well controlled. She will continue with hydrochlorothiazide 12.5mg  daily and metoprolol succinate XL 25mg  daily. No med changes today. She will rto in six months for re-evaluation.

## 2024-05-08 ENCOUNTER — Encounter (INDEPENDENT_AMBULATORY_CARE_PROVIDER_SITE_OTHER): Payer: Self-pay

## 2024-05-11 ENCOUNTER — Other Ambulatory Visit: Payer: Self-pay

## 2024-05-11 DIAGNOSIS — D649 Anemia, unspecified: Secondary | ICD-10-CM

## 2024-05-11 LAB — HEMOCCULT GUIAC POC 1CARD (OFFICE)
Card #2 Fecal Occult Blod, POC: NEGATIVE
Card #3 Fecal Occult Blood, POC: NEGATIVE
Fecal Occult Blood, POC: NEGATIVE

## 2024-05-12 ENCOUNTER — Ambulatory Visit: Payer: Self-pay | Admitting: Internal Medicine

## 2024-06-18 ENCOUNTER — Ambulatory Visit: Admitting: Speech Pathology

## 2024-06-27 ENCOUNTER — Ambulatory Visit (INDEPENDENT_AMBULATORY_CARE_PROVIDER_SITE_OTHER)

## 2024-09-04 ENCOUNTER — Ambulatory Visit: Admitting: Plastic Surgery

## 2024-09-19 ENCOUNTER — Ambulatory Visit: Admitting: Obstetrics and Gynecology

## 2024-09-27 ENCOUNTER — Institutional Professional Consult (permissible substitution): Admitting: Diagnostic Neuroimaging

## 2024-10-16 ENCOUNTER — Encounter: Payer: Self-pay | Admitting: Internal Medicine

## 2025-01-28 ENCOUNTER — Ambulatory Visit: Admitting: Adult Health
